# Patient Record
Sex: Female | Born: 1950
Health system: Southern US, Community
[De-identification: ages and names within clinical notes are randomized; demographics above are authoritative.]

## PROBLEM LIST (undated history)

## (undated) DIAGNOSIS — H269 Unspecified cataract: Secondary | ICD-10-CM

## (undated) DIAGNOSIS — M1990 Unspecified osteoarthritis, unspecified site: Secondary | ICD-10-CM

## (undated) DIAGNOSIS — T7840XA Allergy, unspecified, initial encounter: Secondary | ICD-10-CM

## (undated) DIAGNOSIS — E785 Hyperlipidemia, unspecified: Secondary | ICD-10-CM

## (undated) DIAGNOSIS — C50919 Malignant neoplasm of unspecified site of unspecified female breast: Secondary | ICD-10-CM

## (undated) DIAGNOSIS — Z923 Personal history of irradiation: Secondary | ICD-10-CM

## (undated) HISTORY — PX: VITRECTOMY: SHX106

## (undated) HISTORY — DX: Malignant neoplasm of unspecified site of unspecified female breast: C50.919

## (undated) HISTORY — DX: Unspecified cataract: H26.9

## (undated) HISTORY — PX: EYE SURGERY: SHX253

## (undated) HISTORY — DX: Unspecified osteoarthritis, unspecified site: M19.90

## (undated) HISTORY — DX: Hyperlipidemia, unspecified: E78.5

## (undated) HISTORY — DX: Allergy, unspecified, initial encounter: T78.40XA

## (undated) HISTORY — PX: BREAST SURGERY: SHX581

## (undated) HISTORY — PX: LAPAROSCOPY: SHX197

---

## 1986-07-25 HISTORY — PX: TUBAL LIGATION: SHX77

## 1997-07-25 HISTORY — PX: BREAST EXCISIONAL BIOPSY: SUR124

## 1997-07-25 HISTORY — PX: BREAST LUMPECTOMY: SHX2

## 1999-05-26 ENCOUNTER — Ambulatory Visit (HOSPITAL_BASED_OUTPATIENT_CLINIC_OR_DEPARTMENT_OTHER): Admission: RE | Admit: 1999-05-26 | Discharge: 1999-05-26 | Payer: Self-pay | Admitting: Surgery

## 2001-05-04 ENCOUNTER — Other Ambulatory Visit: Admission: RE | Admit: 2001-05-04 | Discharge: 2001-05-04 | Payer: Self-pay | Admitting: Obstetrics and Gynecology

## 2006-07-25 HISTORY — PX: COLONOSCOPY: SHX174

## 2007-05-23 ENCOUNTER — Ambulatory Visit: Payer: Self-pay | Admitting: Gastroenterology

## 2007-06-04 ENCOUNTER — Ambulatory Visit: Payer: Self-pay | Admitting: Gastroenterology

## 2014-03-12 ENCOUNTER — Ambulatory Visit (INDEPENDENT_AMBULATORY_CARE_PROVIDER_SITE_OTHER): Payer: BC Managed Care – PPO | Admitting: Physician Assistant

## 2014-03-12 ENCOUNTER — Encounter: Payer: Self-pay | Admitting: Physician Assistant

## 2014-03-12 VITALS — BP 94/62 | HR 90 | Ht 62.0 in | Wt 173.0 lb

## 2014-03-12 DIAGNOSIS — Z131 Encounter for screening for diabetes mellitus: Secondary | ICD-10-CM | POA: Diagnosis not present

## 2014-03-12 DIAGNOSIS — T7840XA Allergy, unspecified, initial encounter: Secondary | ICD-10-CM | POA: Diagnosis not present

## 2014-03-12 DIAGNOSIS — E785 Hyperlipidemia, unspecified: Secondary | ICD-10-CM | POA: Diagnosis not present

## 2014-03-12 MED ORDER — METHYLPREDNISOLONE SODIUM SUCC 125 MG IJ SOLR
125.0000 mg | Freq: Once | INTRAMUSCULAR | Status: DC
Start: 2014-03-12 — End: 2014-03-17

## 2014-03-12 NOTE — Patient Instructions (Signed)
Shingles vaccine at next visit.  Solumedrol given today.  Benadryl for itching.

## 2014-03-16 NOTE — Progress Notes (Signed)
   Subjective:    Patient ID: Janice Dudley, female    DOB: December 30, 1950, 62 y.o.   MRN: [id number]  HPI Pt is a 63 yo female who presents to the clinic to establish care.   .. Active Ambulatory Problems    Diagnosis Date Noted  . Hyperlipidemia    Resolved Ambulatory Problems    Diagnosis Date Noted  . No Resolved Ambulatory Problems   No Additional Past Medical History   . Family History  Problem Relation Age of Onset  . Stroke Mother 23  . Heart attack Mother 40  . Cancer Mother 37    Uterine  . Heart attack Father 52  . Cancer Father 66    Lung  . Stroke Father 73  . Cancer Sister 68    Breast and Uterine   . Hyperlipidemia Sister   . Thyroid nodules Brother   . Hyperlipidemia Brother   . Hyperlipidemia Sister   . Hyperlipidemia Brother   . Hyperlipidemia Brother    .Marland Kitchen History   Social History  . Marital Status: Married    Spouse Name: N/A    Number of Children: N/A  . Years of Education: N/A   Occupational History  . Not on file.   Social History Main Topics  . Smoking status: Former Smoker -- 0.25 packs/day for 20 years    Types: Cigarettes  . Smokeless tobacco: Never Used  . Alcohol Use: Yes  . Drug Use: No  . Sexual Activity: Yes   Other Topics Concern  . Not on file   Social History Narrative  . No narrative on file   Pt presents to the clinic with 3 days of itching and hives. She is allergic to peaches and must have eaten too many. She has not been able to stop itching. She denies any SOB or wheezing. No problems swallowing. Tried benadryl does not seem to be helping. She is aware of allergy but does not want to stop eating peaches.    Review of Systems  All other systems reviewed and are negative.      Objective:   Physical Exam  Constitutional: She is oriented to person, place, and time. She appears well-developed and well-nourished.  HENT:  Head: Normocephalic and atraumatic.  Cardiovascular: Normal rate, regular rhythm and  normal heart sounds.   Pulmonary/Chest: Effort normal and breath sounds normal.  Neurological: She is alert and oriented to person, place, and time.  Skin:  Small hives and maculopapular rash with pruritis.   Psychiatric: She has a normal mood and affect. Her behavior is normal.          Assessment & Plan:  Allergic reaction- solumedrol 125mg  given IM today. Discussed zyrtec daily if going to eat peaches and to help with itching. Follow up as needed. Discussed what to do if severe reaction occurred.   Needs CPE.  Cannot get shingles and solumedrol. Come back in 4 weeks for shingles vaccine.  Fasting labs were given for pt to have drawn.

## 2014-03-20 ENCOUNTER — Encounter: Payer: Self-pay | Admitting: Emergency Medicine

## 2014-03-20 ENCOUNTER — Emergency Department
Admission: EM | Admit: 2014-03-20 | Discharge: 2014-03-20 | Disposition: A | Discharge status: Home / Self Care | Payer: BC Managed Care – PPO | Source: Home / Self Care | Attending: Emergency Medicine | Admitting: Emergency Medicine

## 2014-03-20 DIAGNOSIS — N3000 Acute cystitis without hematuria: Secondary | ICD-10-CM

## 2014-03-20 DIAGNOSIS — R300 Dysuria: Secondary | ICD-10-CM

## 2014-03-20 DIAGNOSIS — N3001 Acute cystitis with hematuria: Secondary | ICD-10-CM

## 2014-03-20 LAB — POCT URINALYSIS DIP (MANUAL ENTRY)
Bilirubin, UA: NEGATIVE
Glucose, UA: NEGATIVE
Ketones, POC UA: NEGATIVE
Nitrite, UA: NEGATIVE
Protein Ur, POC: NEGATIVE
Spec Grav, UA: 1.015 (ref 1.005–1.03)
Urobilinogen, UA: 0.2 (ref 0–1)
pH, UA: 7 (ref 5–8)

## 2014-03-20 MED ORDER — CIPROFLOXACIN HCL 500 MG PO TABS: 500.0000 mg | ORAL_TABLET | Freq: Two times a day (BID) | ORAL | Status: DC

## 2014-03-20 NOTE — ED Provider Notes (Signed)
CSN: [id number]     Arrival date & time 03/20/14  1417 History   First MD Initiated Contact with Patient 03/20/14 1430     Chief Complaint  Patient presents with  . Dysuria   (Consider location/radiation/quality/duration/timing/severity/associated sxs/prior Treatment) HPI This is a 63 y.o. female who presents today with UTI symptoms for 5 days.  + dysuria + frequency + urgency No hematuria No vaginal discharge No fever/chills No lower abdominal pain No nausea No vomiting No back pain No fatigue She denies chance of pregnancy. Has tried Azo OTC without significant improvement.  Denies chance of pregnancy. Last UTI was about 3 years ago.    Past Medical History  Diagnosis Date  . Hyperlipidemia    Past Surgical History  Procedure Laterality Date  . Laparoscopy    . Breast lumpectomy    . Cesarean section     Family History  Problem Relation Age of Onset  . Stroke Mother 42  . Heart attack Mother 64  . Cancer Mother 67    Uterine  . Heart attack Father 35  . Cancer Father 34    Lung  . Stroke Father 56  . Cancer Sister 91    Breast and Uterine   . Hyperlipidemia Sister   . Thyroid nodules Brother   . Hyperlipidemia Brother   . Hyperlipidemia Sister   . Hyperlipidemia Brother   . Hyperlipidemia Brother    History  Substance Use Topics  . Smoking status: Former Smoker -- 0.25 packs/day for 20 years    Types: Cigarettes  . Smokeless tobacco: Never Used  . Alcohol Use: Yes   OB History   Grav Para Term Preterm Abortions TAB SAB Ect Mult Living                 Review of Systems  All other systems reviewed and are negative.   Allergies  Peach  Home Medications   Prior to Admission medications   Medication Sig Start Date End Date Taking? Authorizing Provider  phenazopyridine (PYRIDIUM) 95 MG tablet Take 95 mg by mouth 3 (three) times daily as needed for pain.   Yes Historical Provider, MD  aspirin 81 MG tablet Take 81 mg by mouth daily.     Historical Provider, MD  calcium carbonate (TUMS SMOOTHIES) 750 MG chewable tablet Chew 1 tablet by mouth daily.    Historical Provider, MD  cholecalciferol (VITAMIN D) 1000 UNITS tablet Take 1,000 Units by mouth daily.    Historical Provider, MD  ciprofloxacin (CIPRO) 500 MG tablet Take 1 tablet (500 mg total) by mouth 2 (two) times daily. For 7 days 03/20/14   Jacqulyn Cane, MD  diphenhydrAMINE (SOMINEX) 25 MG tablet Take 25 mg by mouth at bedtime as needed for sleep.    Historical Provider, MD  Magnesium 250 MG TABS Take by mouth.    Historical Provider, MD   BP 106/73  Pulse 75  Temp(Src) 97.5 F (36.4 C) (Oral)  Ht 5\' 2"  (1.575 m)  Wt 172 lb (78.019 kg)  BMI 31.45 kg/m2  SpO2 97% Physical Exam  Nursing note and vitals reviewed. Constitutional: She is oriented to person, place, and time. She appears well-developed and well-nourished. No distress.  HENT:  Mouth/Throat: Oropharynx is clear and moist.  Eyes: No scleral icterus.  Neck: Neck supple.  Cardiovascular: Normal rate, regular rhythm and normal heart sounds.   Pulmonary/Chest: Breath sounds normal.  Abdominal: Soft. She exhibits no mass. There is no hepatosplenomegaly. There is tenderness (Very minimal) in  the suprapubic area. There is no rebound, no guarding and no CVA tenderness.  Lymphadenopathy:    She has no cervical adenopathy.  Neurological: She is alert and oriented to person, place, and time.  Skin: Skin is warm and dry.    ED Course  Procedures (including critical care time) Labs Review Labs Reviewed  URINE CULTURE  POCT URINALYSIS DIP (MANUAL ENTRY)   Results for orders placed during the hospital encounter of 03/20/14  POCT URINALYSIS DIP (MANUAL ENTRY)      Result Value Ref Range   Color, UA light yellow     Clarity, UA cloudy     Glucose, UA neg     Bilirubin, UA negative     Bilirubin, UA negative     Spec Grav, UA 1.015  1.005 - 1.03   Blood, UA moderate     pH, UA 7.0  5 - 8   Protein Ur, POC  negative     Urobilinogen, UA 0.2  0 - 1   Nitrite, UA Negative     Leukocytes, UA large (3+)       Imaging Review No results found.   MDM   1. Dysuria   2. Acute cystitis with hematuria    Urinalysis is cloudy, and positive for moderate blood and large leukocytes.  Treatment options discussed, as well as risks, benefits, alternatives. Patient voiced understanding and agreement with the following plans: Send off urine culture Cipro 500 twice a day x7 days Other symptomatic care discussed. Follow-up with your primary care doctor in 5-7 days if not improving, or sooner if symptoms become worse. Precautions discussed. Red flags discussed. Questions invited and answered. Patient voiced understanding and agreement.     Jacqulyn Cane, MD 03/20/14 1500

## 2014-03-20 NOTE — ED Notes (Signed)
Dysuria, polyuria x 6 days, has been taking AZO every other day

## 2014-03-23 LAB — URINE CULTURE: Colony Count: 10000

## 2014-03-24 ENCOUNTER — Telehealth: Payer: Self-pay | Admitting: Emergency Medicine

## 2014-03-25 ENCOUNTER — Encounter: Payer: Self-pay | Admitting: Physician Assistant

## 2014-03-25 DIAGNOSIS — N6019 Diffuse cystic mastopathy of unspecified breast: Secondary | ICD-10-CM | POA: Insufficient documentation

## 2014-04-08 ENCOUNTER — Encounter: Payer: Self-pay | Admitting: Physician Assistant

## 2014-04-08 LAB — COMPLETE METABOLIC PANEL WITH GFR
ALK PHOS: 71 U/L (ref 39–117)
ALT: 20 U/L (ref 0–35)
AST: 22 U/L (ref 0–37)
Albumin: 4.2 g/dL (ref 3.5–5.2)
BILIRUBIN TOTAL: 0.5 mg/dL (ref 0.2–1.2)
BUN: 15 mg/dL (ref 6–23)
CO2: 24 mEq/L (ref 19–32)
Calcium: 9.4 mg/dL (ref 8.4–10.5)
Chloride: 104 mEq/L (ref 96–112)
Creat: 0.85 mg/dL (ref 0.50–1.10)
GFR, EST NON AFRICAN AMERICAN: 74 mL/min
GFR, Est African American: 86 mL/min
GLUCOSE: 89 mg/dL (ref 70–99)
Potassium: 4.3 mEq/L (ref 3.5–5.3)
Sodium: 137 mEq/L (ref 135–145)
Total Protein: 6.5 g/dL (ref 6.0–8.3)

## 2014-04-08 LAB — LIPID PANEL
CHOL/HDL RATIO: 4.8 ratio
CHOLESTEROL: 250 mg/dL — AB (ref 0–200)
HDL: 52 mg/dL (ref >39)
LDL Cholesterol: 164 mg/dL — ABNORMAL HIGH (ref 0–99)
Triglycerides: 172 mg/dL — ABNORMAL HIGH (ref <150)
VLDL: 34 mg/dL (ref 0–40)

## 2014-04-09 ENCOUNTER — Encounter: Payer: BC Managed Care – PPO | Admitting: Physician Assistant

## 2014-04-11 ENCOUNTER — Ambulatory Visit (INDEPENDENT_AMBULATORY_CARE_PROVIDER_SITE_OTHER): Payer: BC Managed Care – PPO | Admitting: Physician Assistant

## 2014-04-11 ENCOUNTER — Encounter: Payer: Self-pay | Admitting: Physician Assistant

## 2014-04-11 VITALS — BP 115/65 | HR 92 | Ht 62.0 in | Wt 175.0 lb

## 2014-04-11 DIAGNOSIS — Z23 Encounter for immunization: Secondary | ICD-10-CM | POA: Diagnosis not present

## 2014-04-11 DIAGNOSIS — E669 Obesity, unspecified: Secondary | ICD-10-CM

## 2014-04-11 DIAGNOSIS — Z0000 Encounter for general adult medical examination without abnormal findings: Secondary | ICD-10-CM

## 2014-04-11 DIAGNOSIS — E785 Hyperlipidemia, unspecified: Secondary | ICD-10-CM

## 2014-04-11 DIAGNOSIS — E781 Pure hyperglyceridemia: Secondary | ICD-10-CM

## 2014-04-11 MED ORDER — PHENTERMINE HCL 37.5 MG PO TABS: 37.5000 mg | ORAL_TABLET | Freq: Every day | ORAL | Status: DC

## 2014-04-11 NOTE — Progress Notes (Signed)
   Subjective:    Patient ID: Flint Melter, female    DOB: 02-24-1951, 63 y.o.   MRN: [id number]  HPI    Review of Systems     Objective:   Physical Exam        Assessment & Plan:   Subjective:     PETE MERTEN is a 63 y.o. female and is here for a comprehensive physical exam. The patient reports no problems.  History   Social History  . Marital Status: Married    Spouse Name: N/A    Number of Children: N/A  . Years of Education: N/A   Occupational History  . Not on file.   Social History Main Topics  . Smoking status: Former Smoker -- 0.25 packs/day for 20 years    Types: Cigarettes  . Smokeless tobacco: Never Used  . Alcohol Use: Yes  . Drug Use: No  . Sexual Activity: Yes   Other Topics Concern  . Not on file   Social History Narrative  . No narrative on file   Health Maintenance  Topic Date Due  . Zostavax  04/16/2012  . Influenza Vaccine  02/23/2015  . Mammogram  07/26/2015  . Pap Smear  07/25/2016  . Colonoscopy  06/03/2017  . Tetanus/tdap  08/26/2023    The following portions of the patient's history were reviewed and updated as appropriate: allergies, current medications, past family history, past medical history, past social history, past surgical history and problem list.  Review of Systems A comprehensive review of systems was negative.   Objective:    BP 115/65  Pulse 92  Ht 5\' 2"  (1.575 m)  Wt 175 lb (79.379 kg)  BMI 32.00 kg/m2 General appearance: alert, cooperative, appears stated age and mildly obese Head: Normocephalic, without obvious abnormality, atraumatic Eyes: conjunctivae/corneas clear. PERRL, EOM's intact. Fundi benign. Ears: normal TM's and external ear canals both ears Nose: Nares normal. Septum midline. Mucosa normal. No drainage or sinus tenderness. Throat: lips, mucosa, and tongue normal; teeth and gums normal Neck: no adenopathy, no carotid bruit, no JVD, supple, symmetrical, trachea midline and thyroid  not enlarged, symmetric, no tenderness/mass/nodules Back: symmetric, no curvature. ROM normal. No CVA tenderness. Lungs: clear to auscultation bilaterally Heart: regular rate and rhythm, S1, S2 normal, no murmur, click, rub or gallop Abdomen: soft, non-tender; bowel sounds normal; no masses,  no organomegaly Extremities: extremities normal, atraumatic, no cyanosis or edema Pulses: 2+ and symmetric Skin: Skin color, texture, turgor normal. No rashes or lesions Lymph nodes: Cervical, supraclavicular, and axillary nodes normal. Neurologic: Grossly normal    Assessment:    Healthy female exam.      Plan:    CPE- Flu shot given today. Tdap up to date. Mammogram done. Pap done. Colonoscopy done. Pt follow up in 4 weeks for shingles vaccines. Gave HO. Discussed vitamin D and calcium recommendations of 800 units and 1200mg . Discussed weight loss, see obesity.   Obesity- given phentermine to start. Discussed SE's. Follow up in 4 weeks. Discussed only as good and diet and exercise with it.   Hyperlipidemia/hypertriglyeremia- LDL elevated. No DM but she does have age working against her. I would like for her to start statin. She would like to try 6 months of diet and exercise. Will recheck in 6 months and make a decision. In meantime can try red yeast rice and/or fish oil to help lower without prescriptions.  See After Visit Summary for Counseling Recommendations

## 2014-04-11 NOTE — Patient Instructions (Signed)
Shingles Shingles (herpes zoster) is an infection that is caused by the same virus that causes chickenpox (varicella). The infection causes a painful skin rash and fluid-filled blisters, which eventually break open, crust over, and heal. It may occur in any area of the body, but it usually affects only one side of the body or face. The pain of shingles usually lasts about 1 month. However, some people with shingles may develop long-term (chronic) pain in the affected area of the body. Shingles often occurs many years after the person had chickenpox. It is more common:  In people older than 50 years.  In people with weakened immune systems, such as those with HIV, AIDS, or cancer.  In people taking medicines that weaken the immune system, such as transplant medicines.  In people under great stress. CAUSES  Shingles is caused by the varicella zoster virus (VZV), which also causes chickenpox. After a person is infected with the virus, it can remain in the person's body for years in an inactive state (dormant). To cause shingles, the virus reactivates and breaks out as an infection in a nerve root. The virus can be spread from person to person (contagious) through contact with open blisters of the shingles rash. It will only spread to people who have not had chickenpox. When these people are exposed to the virus, they may develop chickenpox. They will not develop shingles. Once the blisters scab over, the person is no longer contagious and cannot spread the virus to others. SIGNS AND SYMPTOMS  Shingles shows up in stages. The initial symptoms may be pain, itching, and tingling in an area of the skin. This pain is usually described as burning, stabbing, or throbbing.In a few days or weeks, a painful red rash will appear in the area where the pain, itching, and tingling were felt. The rash is usually on one side of the body in a band or belt-like pattern. Then, the rash usually turns into fluid-filled  blisters. They will scab over and dry up in approximately 2-3 weeks. Flu-like symptoms may also occur with the initial symptoms, the rash, or the blisters. These may include:  Fever.  Chills.  Headache.  Upset stomach. DIAGNOSIS  Your health care provider will perform a skin exam to diagnose shingles. Skin scrapings or fluid samples may also be taken from the blisters. This sample will be examined under a microscope or sent to a lab for further testing. TREATMENT  There is no specific cure for shingles. Your health care provider will likely prescribe medicines to help you manage the pain, recover faster, and avoid long-term problems. This may include antiviral drugs, anti-inflammatory drugs, and pain medicines. HOME CARE INSTRUCTIONS   Take a cool bath or apply cool compresses to the area of the rash or blisters as directed. This may help with the pain and itching.   Take medicines only as directed by your health care provider.   Rest as directed by your health care provider.  Keep your rash and blisters clean with mild soap and cool water or as directed by your health care provider.  Do not pick your blisters or scratch your rash. Apply an anti-itch cream or numbing creams to the affected area as directed by your health care provider.  Keep your shingles rash covered with a loose bandage (dressing).  Avoid skin contact with:  Babies.   Pregnant women.   Children with eczema.   Elderly people with transplants.   People with chronic illnesses, such as leukemia   or AIDS.   Wear loose-fitting clothing to help ease the pain of material rubbing against the rash.  Keep all follow-up visits as directed by your health care provider.If the area involved is on your face, you may receive a referral for a specialist, such as an eye doctor (ophthalmologist) or an ear, nose, and throat (ENT) doctor. Keeping all follow-up visits will help you avoid eye problems, chronic pain, or  disability.  SEEK IMMEDIATE MEDICAL CARE IF:   You have facial pain, pain around the eye area, or loss of feeling on one side of your face.  You have ear pain or ringing in your ear.  You have loss of taste.  Your pain is not relieved with prescribed medicines.   Your redness or swelling spreads.   You have more pain and swelling.  Your condition is worsening or has changed.   You have a fever. MAKE SURE YOU:  Understand these instructions.  Will watch your condition.  Will get help right away if you are not doing well or get worse. Document Released: 07/11/2005 Document Revised: 11/25/2013 Document Reviewed: 02/23/2012 [facility] Patient Information 2015 [place], [place]. This information is not intended to replace advice given to you by your health care provider. Make sure you discuss any questions you have with your health care provider.   Keeping You Healthy  Get These Tests  Blood Pressure- Have your blood pressure checked by your healthcare provider at least once a year.  Normal blood pressure is 120/80.  Weight- Have your body mass index (BMI) calculated to screen for obesity.  BMI is a measure of body fat based on height and weight.  You can calculate your own BMI at [employer].it  Cholesterol- Have your cholesterol checked every year.  Diabetes- Have your blood sugar checked every year if you have high blood pressure, high cholesterol, a family history of diabetes or if you are overweight.  Pap Smear- Have a pap smear every 1 to 3 years if you have been sexually active.  If you are older than 65 and recent pap smears have been normal you may not need additional pap smears.  In addition, if you have had a hysterectomy  For benign disease additional pap smears are not necessary.  Mammogram-Yearly mammograms are essential for early detection of breast cancer  Screening for Colon Cancer- Colonoscopy starting at age 30. Screening may begin sooner  depending on your family history and other health conditions.  Follow up colonoscopy as directed by your Gastroenterologist.  Screening for Osteoporosis- Screening begins at age 30 with bone density scanning, sooner if you are at higher risk for developing Osteoporosis.  Get these medicines  Calcium with Vitamin D- Your body requires 1200-1500 mg of Calcium a day and [phone] IU of Vitamin D a day.  You can only absorb 500 mg of Calcium at a time therefore Calcium must be taken in 2 or 3 separate doses throughout the day.  Hormones- Hormone therapy has been associated with increased risk for certain cancers and heart disease.  Talk to your healthcare provider about if you need relief from menopausal symptoms.  Aspirin- Ask your healthcare provider about taking Aspirin to prevent Heart Disease and Stroke.  Get these Immuniztions  Flu shot- Every fall  Pneumonia shot- Once after the age of 8; if you are younger ask your healthcare provider if you need a pneumonia shot.  Tetanus- Every ten years.  Zostavax- Once after the age of 20 to prevent shingles.  Take  these steps  Don't smoke- Your healthcare provider can help you quit. For tips on how to quit, ask your healthcare provider or go to www.smokefree.gov or call 1-800 QUIT-NOW.  Be physically active- Exercise 5 days a week for a minimum of 30 minutes.  If you are not already physically active, start slow and gradually work up to 30 minutes of moderate physical activity.  Try walking, dancing, bike riding, swimming, etc.  Eat a healthy diet- Eat a variety of healthy foods such as fruits, vegetables, whole grains, low fat milk, low fat cheeses, yogurt, lean meats, chicken, fish, eggs, dried beans, tofu, etc.  For more information go to www.thenutritionsource.org  Dental visit- Brush and floss teeth twice daily; visit your dentist twice a year.  Eye exam- Visit your Optometrist or Ophthalmologist yearly.  Drink alcohol in moderation-  Limit alcohol intake to one drink or less a day.  Never drink and drive.  Depression- Your emotional health is as important as your physical health.  If you're feeling down or losing interest in things you normally enjoy, please talk to your healthcare provider.  Seat Belts- can save your life; always wear one  Smoke/Carbon Monoxide detectors- These detectors need to be installed on the appropriate level of your home.  Replace batteries at least once a year.  Violence- If anyone is threatening or hurting you, please tell your healthcare provider.  Living Will/ Health care power of attorney- Discuss with your healthcare provider and family.

## 2014-04-15 DIAGNOSIS — E781 Pure hyperglyceridemia: Secondary | ICD-10-CM | POA: Insufficient documentation

## 2014-04-15 DIAGNOSIS — E669 Obesity, unspecified: Secondary | ICD-10-CM | POA: Insufficient documentation

## 2014-05-16 ENCOUNTER — Encounter: Payer: Self-pay | Admitting: Physician Assistant

## 2014-05-16 ENCOUNTER — Ambulatory Visit (INDEPENDENT_AMBULATORY_CARE_PROVIDER_SITE_OTHER): Payer: BC Managed Care – PPO | Admitting: Physician Assistant

## 2014-05-16 VITALS — BP 102/69 | HR 86 | Ht 62.0 in | Wt 171.0 lb

## 2014-05-16 DIAGNOSIS — E669 Obesity, unspecified: Secondary | ICD-10-CM

## 2014-05-16 DIAGNOSIS — S93402D Sprain of unspecified ligament of left ankle, subsequent encounter: Secondary | ICD-10-CM

## 2014-05-16 DIAGNOSIS — Z23 Encounter for immunization: Secondary | ICD-10-CM

## 2014-05-16 DIAGNOSIS — S93409A Sprain of unspecified ligament of unspecified ankle, initial encounter: Secondary | ICD-10-CM | POA: Insufficient documentation

## 2014-05-16 DIAGNOSIS — R635 Abnormal weight gain: Secondary | ICD-10-CM

## 2014-05-16 MED ORDER — PHENTERMINE HCL 37.5 MG PO TABS: 37.5000 mg | ORAL_TABLET | Freq: Every day | ORAL | Status: DC

## 2014-05-16 NOTE — Progress Notes (Signed)
   Subjective:    Patient ID: Janice Dudley, female    DOB: 1950-12-16, 63 y.o.   MRN: [id number]  HPI Patient is a 63 year old female who presents to the clinic to followup on phentermine. She is doing well with weight loss. She has lost 4 pounds this month. She likes the way she feels with the phentermine. She denies any side effects of insomnia, anorexia, palpitations. She did reach a roadblock when she sprained her left ankle 4 weeks ago. That has done and her exercise. She continues to try to make better choices for.  She would like me to recheck her left ankle. She has been in a ankle brace for the last 4 weeks. The swelling does seem to decrease. She is no longer taking ibuprofen for pain. Sometimes when she touches out her left foot she does still feel some strain and pain on the lateral side of the ankle. [place] was at the beach 4 [place] ago when she missed the last step of the house she was staying at. She did go to urgent care and had x-rays that were negative for any fracture. She has always been able to bear weight.     Review of Systems  All other systems reviewed and are negative.      Objective:   Physical Exam  Constitutional: She is oriented to person, place, and time. She appears well-developed and well-nourished.  HENT:  Head: Normocephalic and atraumatic.  Cardiovascular: Normal rate, regular rhythm and normal heart sounds.   Pulmonary/Chest: Effort normal and breath sounds normal.  Musculoskeletal:  Very good range of motion of left ankle. There still some residual swelling around the lateral malleolus posteriorly. No bruising or erythema noted. No tenderness over anterior foot. Strength 5 out of 5. Some discomfort with strength test.  Neurological: She is alert and oriented to person, place, and time.  Skin: Skin is dry.  Psychiatric: She has a normal mood and affect. Her behavior is normal.          Assessment & Plan:  Obesity/abnormal weight gain-  phentermine refilled. Discussed with pt more ways to lose weight. Encouraged elipical. Discussed counting calories 1500 calories a weekend. Follow up nurse visit is one month.   Left ankle sprain- reassured patient that her left ankle sprain seems to be healing nicely today. I will continue to wear ankle brace for support for the next 4 weeks. Discussed range of motion exercises of the left ankle. Gave her a copy of some sports medicine exercises to help strengthen. Call with any questions or concerns. Slowly increase walking for weight loss as tolerated do to pain or swelling. Use ibuprofen as needed. Encouraged icing after long periods of standing or exercise.   zostavax given without complication today.  After injection was given he came to my attention that she was given the vaccine that had been in the freezer when the power went out and potentially deactivated live virus due to temperature decrease. We have contacted manager on how to deal with this situation.

## 2014-05-16 NOTE — Patient Instructions (Signed)
Acute Ankle Sprain with Phase II Rehab An acute ankle sprain is a partial or complete tear in one or more of the ligaments of the ankle due to traumatic injury. The severity of the injury depends on both the number of ligaments sprained and the grade of sprain. There are 3 grades of sprains.  A grade 1 sprain is a mild sprain. There is a slight pull without obvious tearing. There is no loss of strength, and the muscle and ligament are the correct length.  A grade 2 sprain is a moderate sprain. There is tearing of fibers within the substance of the ligament where it connects two bones or two cartilages. The length of the ligament is increased, and there is usually decreased strength.  A grade 3 sprain is a complete rupture of the ligament and is uncommon. In addition to the grade of sprain, there are 3 types of ankle sprains.  Lateral ankle sprains. This is a sprain of one or more of the 3 ligaments on the outer side (lateral) of the ankle. These are the most common sprains. Medial ankle sprains. There is one large triangular ligament on the inner side (medial) of the ankle that is susceptible to injury. Medial ankle sprains are less common. Syndesmosis, "high ankle," sprains. The syndesmosis is the ligament that connects the two bones of the lower leg. Syndesmosis sprains usually only occur with very severe ankle sprains. SYMPTOMS  Pain, tenderness, and swelling in the ankle, starting at the side of injury that may progress to the whole ankle and foot with time.  "Pop" or tearing sensation at the time of injury.  Bruising that may spread to the heel.  Impaired ability to walk soon after injury. CAUSES   Acute ankle sprains are caused by trauma placed on the ankle that temporarily forces or pries the anklebone (talus) out of its normal socket.  Stretching or tearing of the ligaments that normally hold the joint in place (usually due to a twisting injury). RISK INCREASES WITH:  Previous  ankle sprain.  Sports in which the foot may land awkwardly (basketball, volleyball, soccer) or walking or running on uneven or rough surfaces.  Shoes with inadequate support to prevent sideways motion when stress occurs.  Poor strength and flexibility.  Poor balance skills.  Contact sports. PREVENTION  Warm up and stretch properly before activity.  Maintain physical fitness:  Ankle and leg flexibility, muscle strength, and endurance.  Cardiovascular fitness.  Balance training activities.  Use proper technique and have a coach correct improper technique.  Taping, protective strapping, bracing, or high-top tennis shoes may help prevent injury. Initially, tape is best. However, it loses most of its support function within 10 to 15 minutes.  Wear proper fitted protective shoes. Combining high-top shoes with taping or bracing is more effective than using either alone.  Provide the ankle with support during sports and practice activities for 12 months following injury. PROGNOSIS   If treated properly, ankle sprains can be expected to recover completely. However, the length of recovery depends on the degree of injury.  A grade 1 sprain usually heals enough in 5 to 7 days to allow modified activity and requires an average of 6 weeks to heal completely.  A grade 2 sprain requires 6 to 10 weeks to heal completely.  A grade 3 sprain requires 12 to 16 weeks to heal.  A syndesmosis sprain often takes more than 3 months to heal. RELATED COMPLICATIONS   Frequent recurrence of symptoms may result   in a chronic problem. Appropriately addressing the problem the first time decreases the frequency of recurrence and optimizes healing time. Severity of initial sprain does not predict the likelihood of later instability.  Injury to other structures (bone, cartilage, or tendon).  Chronically unstable or arthritic ankle joint are possible with repeated sprains. TREATMENT Treatment initially  involves the use of ice, medicine, and compression bandages to help reduce pain and inflammation. Ankle sprains are usually immobilized in a walking cast or boot to allow for healing. Crutches may be recommended to reduce pressure on the injury. After immobilization, strengthening and stretching exercises may be necessary to regain strength and a full range of motion. Surgery is rarely needed to treat ankle sprains. MEDICATION   Nonsteroidal anti-inflammatory medicines, such as aspirin and ibuprofen (do not take for the first 3 days after injury or within 7 days before surgery), or other minor pain relievers, such as acetaminophen, are often recommended. Take these as directed by your caregiver. Contact your caregiver immediately if any bleeding, stomach upset, or signs of an allergic reaction occur from these medicines.  Ointments applied to the skin may be helpful.  Pain relievers may be prescribed as necessary by your caregiver. Do not take prescription pain medicine for longer than 4 to 7 days. Use only as directed and only as much as you need. HEAT AND COLD  Cold treatment (icing) is used to relieve pain and reduce inflammation for acute and chronic cases. Cold should be applied for 10 to 15 minutes every 2 to 3 hours for inflammation and pain and immediately after any activity that aggravates your symptoms. Use ice packs or an ice massage.  Heat treatment may be used before performing stretching and strengthening activities prescribed by your caregiver. Use a heat pack or a warm soak. SEEK IMMEDIATE MEDICAL CARE IF:   Pain, swelling, or bruising worsens despite treatment.  You experience pain, numbness, discoloration, or coldness in the foot or toes.  New, unexplained symptoms develop. (Drugs used in treatment may produce side effects.) EXERCISES  PHASE II EXERCISES RANGE OF MOTION (ROM) AND STRETCHING EXERCISES - Ankle Sprain, Acute-Phase II, Weeks 3 to 4 After your physician, physical  therapist, or athletic trainer feels your knee has made progress significant enough to begin more advanced exercises, he or she may recommend completing some of the following exercises. Although each person heals at different rates, most people will be ready for these exercises between 3 and 4 weeks after their injury. Do not begin these exercises until you have your caregiver's permission. He or she may also advise you to continue with the exercises which you completed in Phase I of your rehabilitation. While completing these exercises, remember:   Restoring tissue flexibility helps normal motion to return to the joints. This allows healthier, less painful movement and activity.  An effective stretch should be held for at least 30 seconds.  A stretch should never be painful. You should only feel a gentle lengthening or release in the stretched tissue. RANGE OF MOTION - Ankle Plantar Flexion   Sit with your right / left leg crossed over your opposite knee.  Use your opposite hand to pull the top of your foot and toes toward you.  You should feel a gentle stretch on the top of your foot/ankle. Hold this position for __________. Repeat __________ times. Complete __________ times per day.  RANGE OF MOTION - Ankle Eversion  Sit with your right / left ankle crossed over your opposite knee.    Grip your foot with your opposite hand, placing your thumb on the top of your foot and your fingers across the bottom of your foot.  Gently push your foot downward with a slight rotation so your littlest toes rise slightly  You should feel a gentle stretch on the inside of your ankle. Hold the stretch for __________ seconds. Repeat __________ times. Complete this exercise __________ times per day.  RANGE OF MOTION - Ankle Inversion  Sit with your right / left ankle crossed over your opposite knee.  Grip your foot with your opposite hand, placing your thumb on the bottom of your foot and your fingers across  the top of your foot.  Gently pull your foot so the smallest toe comes toward you and your thumb pushes the inside of the ball of your foot away from you.  You should feel a gentle stretch on the outside of your ankle. Hold the stretch for __________ seconds. Repeat __________ times. Complete this exercise __________ times per day.  STRETCH - Gastrocsoleus  Sit with your right / left leg extended. Holding onto both ends of a belt or towel, loop it around the ball of your foot.  Keeping your right / left ankle and foot relaxed and your knee straight, pull your foot and ankle toward you using the belt/towel.  You should feel a gentle stretch behind your calf or knee. Hold this position for __________ seconds. Repeat __________ times. Complete this stretch __________ times per day.  RANGE OF MOTION - Ankle Dorsiflexion, Active Assisted  Remove shoes and sit on a chair that is preferably not on a carpeted surface.  Place right / left foot under knee. Extend your opposite leg for support.  Keeping your heel down, slide your right / left foot back toward the chair until you feel a stretch at your ankle or calf. If you do not feel a stretch, slide your bottom forward to the edge of the chair while still keeping your heel down.  Hold this stretch for __________ seconds. Repeat __________ times. Complete this stretch __________ times per day.  STRETCH - Gastroc, Standing   Place hands on wall.  Extend right / left leg and place a folded washcloth under the arch of your foot for support. Keep the front knee somewhat bent.  Slightly point your toes inward on your back foot.  Keeping your right / left heel on the floor and your knee straight, shift your weight toward the wall, not allowing your back to arch.  You should feel a gentle stretch in the calf. Hold this position for __________ seconds. Repeat __________ times. Complete this stretch __________ times per day. STRETCH - Soleus,  Standing  Place hands on wall.  Extend right / left leg and place a folded washcloth under the arch of your foot for support. Keep the front knee somewhat bent.  Slightly point your toes inward on your back foot.  Keep your right / left heel on the floor, bend your back knee, and slightly shift your weight over the back leg so that you feel a gentle stretch deep in your back calf.  Hold this position for __________ seconds. Repeat __________ times. Complete this stretch __________ times per day. STRETCH - Gastrocsoleus, Standing Note: This exercise can place a lot of stress on your foot and ankle. Please complete this exercise only if specifically instructed by your caregiver.   Place the ball of your right / left foot on a step, keeping your other   foot firmly on the same step.  Hold on to the wall or a rail for balance.  Slowly lift your other foot, allowing your body weight to press your heel down over the edge of the step.  You should feel a stretch in your right / left calf.  Hold this position for __________ seconds.  Repeat this exercise with a slight bend in your knee. Repeat __________ times. Complete this stretch __________ times per day.  STRENGTHENING EXERCISES - Ankle Sprain, Acute-Phase II Around 3 to 4 weeks after your injury, you may progress to some of these exercises in your rehabilitation program. Do not begin these until you have your caregiver's permission. Although your condition has improved, the Phase I exercises will continue to be helpful and you may continue to complete them. As you complete strengthening exercises, remember:   Strong muscles with good endurance tolerate stress better.  Do the exercises as initially prescribed by your caregiver. Progress slowly with each exercise, gradually increasing the number of repetitions and weight used under his or her guidance.  You may experience muscle soreness or fatigue, but the pain or discomfort you are trying  to eliminate should never worsen during these exercises. If this pain does worsen, stop and make certain you are following the directions exactly. If the pain is still present after adjustments, discontinue the exercise until you can discuss the trouble with your caregiver. STRENGTH - Plantar-flexors, Standing  Stand with your feet shoulder width apart. Steady yourself with a wall or table using as little support as needed.  Keeping your weight evenly spread over the width of your feet, rise up on your toes.*  Hold this position for __________ seconds. Repeat __________ times. Complete this exercise __________ times per day.  *If this is too easy, shift your weight toward your right / left leg until you feel challenged. Ultimately, you may be asked to do this exercise with your right / left foot only. STRENGTH - Dorsiflexors and Plantar-flexors, Heel/toe Walking  Dorsiflexion: Walk on your heels only. Keep your toes as high as possible.  Walk for ____________________ seconds/feet.  Repeat __________ times. Complete __________ times per day.  Plantar flexion: Walk on your toes only. Keep your heels as high as possible.  Walk for ____________________ seconds/feet. Repeat __________ times. Complete __________ times per day.  BALANCE - Tandem Walking  Place your uninjured foot on a line 2 to 4 inches wide and at least 10 feet long.  Keeping your balance without using anything for extra support, place your right / left heel directly in front of your other foot.  Slowly raise your back foot up, lifting from the heel to the toes, and place it directly in front of the right / left foot.  Continue to walk along the line slowly. Walk for ____________________ feet. Repeat ____________________ times. Complete ____________________ times per day. BALANCE - Inversion/Eversion Use caution, these are advanced level exercises. Do not begin them until you are advised to do so.   Create a balance  board using a sturdy board about 1  feet long and at 1 to 1  feet wide and a 1  inch diameter rod or pipe that is as long as the board's width. A copper pipe or a solid broomstick work well.  Stand on a non-carpeted surface near a countertop or wall. Step onto the board so that your feet are hip-width apart and equally straddle the rod/pipe.  Keeping your feet in place, complete these two exercises   without shifting your upper body or hips:  Tip the board from side-to-side. Control the movement so the board does not forcefully strike the ground. The board should silently tap the ground.  Tip the board side-to-side without striking the ground. Occasionally pause and maintain a steady position at various points.  Repeat the first two exercises, but use only your right / left foot. Place your right / left foot directly over the rod/pipe. Repeat __________ times. Complete this exercise __________ times a day. BALANCE - Plantar/Dorsi Flexion Use caution, these are advanced level exercises. Do not begin them until you are advised to do so.   Create a balance board using a sturdy board about 1  feet long and at 1 to 1  feet wide and a 1  inch diameter rod or pipe that is as long as the board's width. A copper pipe or a solid broomstick work well.  Stand on a non-carpeted surface near a countertop or wall. Stand on the board so that the rod/pipe runs under the arches in your feet.  Keeping your feet in place, complete these two exercises without shifting your upper body or hips:  Tip the board from side-to-side. Control the movement so the board does not forcefully strike the ground. The board should silently tap the ground.  Tip the board side-to-side without striking the ground. Occasionally pause and maintain a steady position at various points.  Repeat the first two exercises, but use only your right / left foot. Stand in the center of the board. Repeat __________ times. Complete this  exercise __________ times a day. STRENGTH - Plantar-flexors, Eccentric Note: This exercise can place a lot of stress on your foot and ankle. Please complete this exercise only if specifically instructed by your caregiver.   Place the balls of your feet on a step. With your hands, use only enough support from a wall or rail to keep your balance.  Keep your knees straight and rise up on your toes.  Slowly shift your weight entirely to your toes and pick up your opposite foot. Gently and with controlled movement, lower your weight through your right / left foot so that your heel drops below the level of the step. You will feel a slight stretch in the back of your calf at the ending position.  Use the healthy leg to help rise up onto the balls of both feet, then lower weight only on the right / left leg again. Build up to 15 repetitions. Then progress to 3 consecutive sets of 15 repetitions.*  After completing the above exercise, complete the same exercise with a slight knee bend (about 30 degrees). Again, build up to 15 repetitions. Then progress to 3 consecutive sets of 15 repetitions.* Perform this exercise __________ times per day.  *When you easily complete 3 sets of 15, your physician, physical therapist, or athletic trainer may advise you to add resistance by wearing a backpack filled with additional weight. Document Released: 10/31/2005 Document Revised: 10/03/2011 Document Reviewed: 10/23/2008 Exit[employer] Patient Information 2015 [employer]. This information is not intended to replace advice given to you by your health care provider. Make sure you discuss any questions you have with your health care provider.  

## 2014-06-16 ENCOUNTER — Other Ambulatory Visit: Payer: Self-pay | Admitting: Physician Assistant

## 2014-06-17 ENCOUNTER — Ambulatory Visit (INDEPENDENT_AMBULATORY_CARE_PROVIDER_SITE_OTHER): Payer: BC Managed Care – PPO | Admitting: Physician Assistant

## 2014-06-17 VITALS — BP 110/70 | HR 87 | Wt 168.0 lb

## 2014-06-17 DIAGNOSIS — R635 Abnormal weight gain: Secondary | ICD-10-CM

## 2014-06-17 DIAGNOSIS — E669 Obesity, unspecified: Secondary | ICD-10-CM | POA: Diagnosis not present

## 2014-06-17 MED ORDER — PHENTERMINE HCL 37.5 MG PO TABS: 37.5000 mg | ORAL_TABLET | Freq: Every day | ORAL | Status: DC

## 2014-06-17 NOTE — Progress Notes (Signed)
   Subjective:    Patient ID: Janice Dudley, female    DOB: Apr 08, 1951, 63 y.o.   MRN: [id number]  HPI  Janice Dudley is here for a blood pressure and weight check. Denies insomnia, increased heart rate, chest pain or shortness of breath.   Review of Systems     Objective:   Physical Exam        Assessment & Plan:  Janice Dudley has lost weight. A prescription of phentermine will be faxed to [facility]. Down 3lbs. Doing well. Follow up in 1 month. Janice Planas PA-C

## 2014-07-16 ENCOUNTER — Ambulatory Visit (INDEPENDENT_AMBULATORY_CARE_PROVIDER_SITE_OTHER): Payer: BC Managed Care – PPO | Admitting: Physician Assistant

## 2014-07-16 ENCOUNTER — Encounter: Payer: Self-pay | Admitting: Physician Assistant

## 2014-07-16 VITALS — BP 123/84 | HR 89 | Temp 98.0°F | Ht 62.0 in | Wt 164.0 lb

## 2014-07-16 DIAGNOSIS — R059 Cough, unspecified: Secondary | ICD-10-CM

## 2014-07-16 DIAGNOSIS — J069 Acute upper respiratory infection, unspecified: Secondary | ICD-10-CM | POA: Diagnosis not present

## 2014-07-16 DIAGNOSIS — R05 Cough: Secondary | ICD-10-CM | POA: Diagnosis not present

## 2014-07-16 DIAGNOSIS — E669 Obesity, unspecified: Secondary | ICD-10-CM | POA: Diagnosis not present

## 2014-07-16 DIAGNOSIS — R635 Abnormal weight gain: Secondary | ICD-10-CM

## 2014-07-16 MED ORDER — AZITHROMYCIN 250 MG PO TABS: ORAL_TABLET | ORAL | Status: DC

## 2014-07-16 MED ORDER — BENZONATATE 200 MG PO CAPS: 200.0000 mg | ORAL_CAPSULE | Freq: Two times a day (BID) | ORAL | Status: DC | PRN

## 2014-07-16 MED ORDER — HYDROCODONE-HOMATROPINE 5-1.5 MG/5ML PO SYRP: 5.0000 mL | ORAL_SOLUTION | Freq: Every evening | ORAL | Status: DC | PRN

## 2014-07-16 MED ORDER — PHENTERMINE HCL 37.5 MG PO TABS: 37.5000 mg | ORAL_TABLET | Freq: Every day | ORAL | Status: DC

## 2014-07-16 NOTE — Patient Instructions (Signed)
Upper Respiratory Infection, Adult An upper respiratory infection (URI) is also sometimes known as the common cold. The upper respiratory tract includes the nose, sinuses, throat, trachea, and bronchi. Bronchi are the airways leading to the lungs. Most people improve within 1 week, but symptoms can last up to 2 weeks. A residual cough may last even longer.  CAUSES Many different viruses can infect the tissues lining the upper respiratory tract. The tissues become irritated and inflamed and often become very moist. Mucus production is also common. A cold is contagious. You can easily spread the virus to others by oral contact. This includes kissing, sharing a glass, coughing, or sneezing. Touching your mouth or nose and then touching a surface, which is then touched by another person, can also spread the virus. SYMPTOMS  Symptoms typically develop 1 to 3 days after you come in contact with a cold virus. Symptoms vary from person to person. They may include:  Runny nose.  Sneezing.  Nasal congestion.  Sinus irritation.  Sore throat.  Loss of voice (laryngitis).  Cough.  Fatigue.  Muscle aches.  Loss of appetite.  Headache.  Low-grade fever. DIAGNOSIS  You might diagnose your own cold based on familiar symptoms, since most people get a cold 2 to 3 times a year. Your caregiver can confirm this based on your exam. Most importantly, your caregiver can check that your symptoms are not due to another disease such as strep throat, sinusitis, pneumonia, asthma, or epiglottitis. Blood tests, throat tests, and X-rays are not necessary to diagnose a common cold, but they may sometimes be helpful in excluding other more serious diseases. Your caregiver will decide if any further tests are required. RISKS AND COMPLICATIONS  You may be at risk for a more severe case of the common cold if you smoke cigarettes, have chronic heart disease (such as heart failure) or lung disease (such as asthma), or if  you have a weakened immune system. The very young and very old are also at risk for more serious infections. Bacterial sinusitis, middle ear infections, and bacterial pneumonia can complicate the common cold. The common cold can worsen asthma and chronic obstructive pulmonary disease (COPD). Sometimes, these complications can require emergency medical care and may be life-threatening. PREVENTION  The best way to protect against getting a cold is to practice good hygiene. Avoid oral or hand contact with people with cold symptoms. Wash your hands often if contact occurs. There is no clear evidence that vitamin C, vitamin E, echinacea, or exercise reduces the chance of developing a cold. However, it is always recommended to get plenty of rest and practice good nutrition. TREATMENT  Treatment is directed at relieving symptoms. There is no cure. Antibiotics are not effective, because the infection is caused by a virus, not by bacteria. Treatment may include:  Increased fluid intake. Sports drinks offer valuable electrolytes, sugars, and fluids.  Breathing heated mist or steam (vaporizer or shower).  Eating chicken soup or other clear broths, and maintaining good nutrition.  Getting plenty of rest.  Using gargles or lozenges for comfort.  Controlling fevers with ibuprofen or acetaminophen as directed by your caregiver.  Increasing usage of your inhaler if you have asthma. Zinc gel and zinc lozenges, taken in the first 24 hours of the common cold, can shorten the duration and lessen the severity of symptoms. Pain medicines may help with fever, muscle aches, and throat pain. A variety of non-prescription medicines are available to treat congestion and runny nose. Your caregiver   can make recommendations and may suggest nasal or lung inhalers for other symptoms.  HOME CARE INSTRUCTIONS   Only take over-the-counter or prescription medicines for pain, discomfort, or fever as directed by your  caregiver.  Use a warm mist humidifier or inhale steam from a shower to increase air moisture. This may keep secretions moist and make it easier to breathe.  Drink enough water and fluids to keep your urine clear or pale yellow.  Rest as needed.  Return to work when your temperature has returned to normal or as your caregiver advises. You may need to stay home longer to avoid infecting others. You can also use a face mask and careful hand washing to prevent spread of the virus. SEEK MEDICAL CARE IF:   After the first few days, you feel you are getting worse rather than better.  You need your caregiver's advice about medicines to control symptoms.  You develop chills, worsening shortness of breath, or brown or red sputum. These may be signs of pneumonia.  You develop yellow or brown nasal discharge or pain in the face, especially when you bend forward. These may be signs of sinusitis.  You develop a fever, swollen neck glands, pain with swallowing, or white areas in the back of your throat. These may be signs of strep throat. SEEK IMMEDIATE MEDICAL CARE IF:   You have a fever.  You develop severe or persistent headache, ear pain, sinus pain, or chest pain.  You develop wheezing, a prolonged cough, cough up blood, or have a change in your usual mucus (if you have chronic lung disease).  You develop sore muscles or a stiff neck. Document Released: 01/04/2001 Document Revised: 10/03/2011 Document Reviewed: 10/16/2013 [employer] Patient Information 2015 [employer]. This information is not intended to replace advice given to you by your health care provider. Make sure you discuss any questions you have with your health care provider.  

## 2014-07-16 NOTE — Progress Notes (Signed)
   Subjective:    Patient ID: Janice Dudley, female    DOB: 1951/06/12, 63 y.o.   MRN: [id number]  HPI  Pt is a 63 yo women who presents to the clinic to follow up on weight loss with phentermine. Down another 4lbs. Denies any side effects. Feeling more energy. Eating less and walking more.   She has had a cough for last week. Production has increased recently. No SOB or wheezing. No fever. Pressure behind ear but denies any sinus pressure. Tried robatussin with little relief. Minimal ST. Hx of pneumonia   Review of Systems  All other systems reviewed and are negative.      Objective:   Physical Exam  Constitutional: She is oriented to person, place, and time. She appears well-developed and well-nourished.  HENT:  Head: Normocephalic and atraumatic.  Right Ear: External ear normal.  Left Ear: External ear normal.  Nose: Nose normal.  Mouth/Throat: Oropharynx is clear and moist.  Eyes: Conjunctivae are normal. Right eye exhibits no discharge. Left eye exhibits no discharge.  Neck: Normal range of motion. Neck supple.  Cardiovascular: Normal rate, regular rhythm and normal heart sounds.   Pulmonary/Chest: Effort normal and breath sounds normal. She has no wheezes.  Dry cough on exam today.   Lymphadenopathy:    She has no cervical adenopathy.  Neurological: She is alert and oriented to person, place, and time.  Skin: Skin is dry.  Psychiatric: She has a normal mood and affect. Her behavior is normal.          Assessment & Plan:  Obesity/abnormal weight gain- down another 4lbs. Refilled. Keep up good work.   Acute URI- discussed symptoms seem consistent with cold. Can last 8-10 days. Consider zinc and vitamin C. Gave HO. Gave cough syrup for bedtime and tessalon pearls during the day. Encouraged honey and tea. zpak given if symptoms worsen due to holiday.

## 2014-08-20 ENCOUNTER — Encounter: Payer: Self-pay | Admitting: Physician Assistant

## 2015-01-15 ENCOUNTER — Other Ambulatory Visit: Payer: Self-pay | Admitting: Obstetrics & Gynecology

## 2015-01-16 LAB — CYTOLOGY - PAP

## 2016-01-01 ENCOUNTER — Encounter: Payer: Self-pay | Admitting: Emergency Medicine

## 2016-01-01 ENCOUNTER — Emergency Department
Admission: EM | Admit: 2016-01-01 | Discharge: 2016-01-01 | Disposition: A | Discharge status: Home / Self Care | Payer: BC Managed Care – PPO | Source: Home / Self Care | Attending: Family Medicine | Admitting: Family Medicine

## 2016-01-01 DIAGNOSIS — M7522 Bicipital tendinitis, left shoulder: Secondary | ICD-10-CM | POA: Diagnosis not present

## 2016-01-01 DIAGNOSIS — R0602 Shortness of breath: Secondary | ICD-10-CM | POA: Diagnosis not present

## 2016-01-01 NOTE — Discharge Instructions (Signed)
Apply ice pack for 20 to 30 minutes, 3 to 4 times daily  Continue until pain decreases.  Begin Ibuprofen 200mg , 4 tabs every 8 hours with food.  Begin stretching and range of motion exercises as tolerated.  Consider using a backpack that distributes weight equally on both shoulders. Recommend cardiac evaluation.   Bicipital Tendonitis Bicipital tendonitis refers to redness, soreness, and swelling (inflammation) or irritation of the bicep tendon. The biceps muscle is located between the elbow and shoulder of the inner arm. The tendon heads, similar to pieces of rope, connect the bicep muscle to the shoulder socket. They are called short head and long head tendons. When tendonitis occurs, the long head tendon is inflamed and swollen, and may be thickened or partially torn.  Bicipital tendonitis can occur with other problems as well, such as arthritis in the shoulder or acromioclavicular joints, tears in the tendons, or other rotator cuff problems.  CAUSES  Overuse of of the arms for overhead activities is the major cause of tendonitis. Many athletes, such as swimmers, baseball players, and tennis players are prone to bicipital tendonitis. Jobs that require manual labor or routine chores, especially chores involving overhead activities can result in overuse and tendonitis. SYMPTOMS Symptoms may include:  Pain in and around the front of the shoulder. Pain may be worse with overhead motion.  Pain or aching that radiates down the arm.  Clicking or shifting sensations in the shoulder. DIAGNOSIS Your caregiver may perform the following:  Physical exam and tests of the biceps and shoulder to observe range of motion, strength, and stability.  X-rays or magnetic resonance imaging (MRI) to confirm the diagnosis. In most common cases, these tests are not necessary. Since other problems may exist in the shoulder or rotator cuff, additional tests may be recommended. TREATMENT Treatment may include the  following:  Medications  Your caregiver may prescribe over-the-counter pain relievers.  Steroid injections, such as cortisone, may be recommended. These may help to reduce inflammation and pain.  Physical Therapy - Your caregiver may recommend gentle exercises with the arm. These can help restore strength and range of motion. They may be done at home or with a physical therapist's supervision and input.  Surgery - Arthroscopic or open surgery sometimes is necessary. Surgery may include:  Reattachment or repair of the tendon at the shoulder socket.  Removal of the damaged section of the tendon.  Anchoring the tendon to a different area of the shoulder (tenodesis). HOME CARE INSTRUCTIONS   Avoid overhead motion of the affected arm or any other motion that causes pain.  Take medication for pain as directed. Do not take these for more than 3 weeks, unless directed to do so by your caregiver.  Ice the affected area for 20 minutes at a time, 3-4 times per day. Place a towel on the skin over the painful area and the ice or cold pack over the towel. Do not place ice directly on the skin.  Perform gentle exercises at home as directed. These will increase strength and flexibility. PREVENTION  Modify your activities as much as possible to protect your arm. A physical therapist or sports medicine physician can help you understand options for safe motion.  Avoid repetitive overhead pulling, lifting, reaching, and throwing until your caregiver tells you it is ok to resume these activities. SEEK MEDICAL CARE IF:  Your pain worsens.  You have difficulty moving the affected arm.  You have trouble performing any of the self-care instructions. MAKE SURE YOU:  Understand these instructions.  Will watch your condition.  Will get help right away if you are not doing well or get worse.   This information is not intended to replace advice given to you by your health care provider. Make sure  you discuss any questions you have with your health care provider.   Document Released: 08/13/2010 Document Revised: 10/03/2011 Document Reviewed: 01/28/2015 Elsevier Interactive Patient Education [employer].

## 2016-01-01 NOTE — ED Notes (Signed)
Alert and oriented x 3; no risks positive during stroke assessment.

## 2016-01-01 NOTE — ED Notes (Signed)
Reports numb sensation of left arm today and 2 other occasions over past 2 weeks; mentioned feeling a little short of breath.

## 2016-01-01 NOTE — ED Provider Notes (Signed)
CSN: [id number]     Arrival date & time 01/01/16  1637 History   First MD Initiated Contact with Patient 01/01/16 1703     Chief Complaint  Patient presents with  . Extremity Weakness      HPI Comments: Patient reports that at 3:30pm she awakened from a nap and experienced numbness/tingling in her left arm that lasted about 30 minutes before resolving.  She had a similar episode several days ago.  She felt mildly short of breath during the sensation of tingling.  No other neurologic symptoms.  No chest pain.  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 Family history of MI in maternal relatives.  The history is provided by the patient.    Past Medical History  Diagnosis Date  . Hyperlipidemia    Past Surgical History  Procedure Laterality Date  . Laparoscopy    . Breast lumpectomy    . Cesarean section     Family History  Problem Relation Age of Onset  . Stroke Mother 57  . Heart attack Mother 8  . Cancer Mother 20    Uterine  . Heart attack Father 33  . Cancer Father 42    Lung  . Stroke Father 54  . Cancer Sister 84    Breast and Uterine   . Hyperlipidemia Sister   . Thyroid nodules Brother   . Hyperlipidemia Brother   . Hyperlipidemia Sister   . Hyperlipidemia Brother   . Hyperlipidemia Brother    Social History  Substance Use Topics  . Smoking status: Former Smoker -- 0.25 packs/day for 20 years    Types: Cigarettes  . Smokeless tobacco: Never Used  . Alcohol Use: Yes   OB History    No data available     Review of Systems  Constitutional: Negative for fever, chills, diaphoresis, activity change and fatigue.  HENT: Negative.   Eyes: Negative.   Respiratory: Positive for shortness of breath. Negative for cough, chest tightness and wheezing.   Cardiovascular: Negative for chest pain, palpitations and leg swelling.  Gastrointestinal: Negative.   Genitourinary: Negative.    Musculoskeletal:       Left shoulder soreness  Skin: Negative.   Neurological: Positive for numbness. Negative for dizziness, facial asymmetry, speech difficulty, weakness, light-headedness and headaches.    Allergies  Peach and Shellfish allergy  Home Medications   Prior to Admission medications   Medication Sig Start Date End Date Taking? Authorizing Provider  aspirin 81 MG tablet Take 81 mg by mouth daily.    Historical Provider, MD  azithromycin (ZITHROMAX) 250 MG tablet 2 tablets now and then one tablet for 4 days. 07/16/14   Jade L Breeback, PA-C  benzonatate (TESSALON) 200 MG capsule Take 1 capsule (200 mg total) by mouth 2 (two) times daily as needed for cough. 07/16/14   Jade L Breeback, PA-C  calcium carbonate (TUMS SMOOTHIES) 750 MG chewable tablet Chew 1 tablet by mouth daily.    Historical Provider, MD  cholecalciferol (VITAMIN D) 1000 UNITS tablet Take 1,000 Units by mouth daily.    Historical Provider, MD  diphenhydrAMINE (SOMINEX) 25 MG tablet Take 25 mg by mouth at bedtime as needed for sleep.    Historical Provider, MD  HYDROcodone-homatropine (HYCODAN) 5-1.5 MG/5ML syrup Take 5 mLs by mouth at bedtime as needed. 07/16/14   Jade L Breeback, PA-C  Magnesium 250 MG TABS Take by mouth.  Historical Provider, MD  phentermine (ADIPEX-P) 37.5 MG tablet Take 1 tablet (37.5 mg total) by mouth daily before breakfast. 07/16/14   [name], PA-C   Meds Ordered and Administered this Visit  Medications - No data to display  BP 126/83 mmHg  Temp(Src) 97.6 F (36.4 C) (Oral)  Resp 16  Ht 5\' 2"  (1.575 m)  Wt 175 lb (79.379 kg)  BMI 32.00 kg/m2  SpO2 96% No data found.   Physical Exam Nursing notes and Vital Signs reviewed. Appearance:  Patient appears stated age, and in no acute distress.  Patient is obese (BMI 32.0) Eyes:  Pupils are equal, round, and reactive to light and accomodation.  Extraocular movement is intact.  Conjunctivae are not inflamed  Nose:   Normal.  No sinus tenderness.  Pharynx:  Normal Neck:  Supple.  No adenopathy  Lungs:  Clear to auscultation.  Breath sounds are equal.  Moving air well. Heart:  Regular rate and rhythm without murmurs, rubs, or gallops.  Abdomen:  Nontender without masses or hepatosplenomegaly.  Bowel sounds are present.  No CVA or flank tenderness.  Extremities:  No edema.  Left shoulder:  Decreased range of motion; There is distinct tenderness to palpation over the insertion of the long head of the biceps tendon.  Skin:  No rash present.  Neurologic:  Distal neurovascular function is intact left upper extremitiy.  ED Course  Procedures none    Labs Reviewed -  EKG: Rate:  76 BPM PR:  154 msec QT:  398 msec QTcH:  426 msec QRSD:  98 msec QRS axis:  -14 degrees Interpretation:  Normal sinus rhythm; no acute changes    No significant change from EKG done 08/17/2011   MDM   1. Biceps tendonitis on left      Apply ice pack for 20 to 30 minutes, 3 to 4 times daily  Continue until pain decreases.  Begin Ibuprofen 200mg , 4 tabs every 8 hours with food.  Begin stretching and range of motion exercises as tolerated.  Consider using a backpack that distributes weight equally on both shoulders. Recommend cardiac evaluation. Followup with Dr. Aundria Mems or Dr. Lynne Leader ([facility]) if not improving about two weeks.  If symptoms become significantly worse during the night or over the weekend, proceed to the local emergency room.    Kandra Nicolas, MD 01/09/16 1039

## 2016-03-02 ENCOUNTER — Encounter: Payer: Self-pay | Admitting: Physician Assistant

## 2016-03-02 ENCOUNTER — Ambulatory Visit (INDEPENDENT_AMBULATORY_CARE_PROVIDER_SITE_OTHER): Payer: BC Managed Care – PPO | Admitting: Physician Assistant

## 2016-03-02 VITALS — BP 138/71 | HR 68 | Ht 62.0 in | Wt 177.0 lb

## 2016-03-02 DIAGNOSIS — E669 Obesity, unspecified: Secondary | ICD-10-CM | POA: Diagnosis not present

## 2016-03-02 DIAGNOSIS — Z780 Asymptomatic menopausal state: Secondary | ICD-10-CM | POA: Insufficient documentation

## 2016-03-02 DIAGNOSIS — Z131 Encounter for screening for diabetes mellitus: Secondary | ICD-10-CM

## 2016-03-02 DIAGNOSIS — Z13220 Encounter for screening for lipoid disorders: Secondary | ICD-10-CM | POA: Diagnosis not present

## 2016-03-02 DIAGNOSIS — Z0000 Encounter for general adult medical examination without abnormal findings: Secondary | ICD-10-CM

## 2016-03-02 DIAGNOSIS — L819 Disorder of pigmentation, unspecified: Secondary | ICD-10-CM

## 2016-03-02 DIAGNOSIS — Z1159 Encounter for screening for other viral diseases: Secondary | ICD-10-CM

## 2016-03-02 MED ORDER — PHENTERMINE HCL 37.5 MG PO TABS: 37.5000 mg | ORAL_TABLET | Freq: Every day | ORAL | 0 refills | Status: DC

## 2016-03-02 NOTE — Patient Instructions (Signed)

## 2016-03-03 LAB — COMPLETE METABOLIC PANEL WITH GFR
ALT: 17 U/L (ref 6–29)
AST: 18 U/L (ref 10–35)
Albumin: 4.3 g/dL (ref 3.6–5.1)
Alkaline Phosphatase: 65 U/L (ref 33–130)
BUN: 16 mg/dL (ref 7–25)
CHLORIDE: 103 mmol/L (ref 98–110)
CO2: 27 mmol/L (ref 20–31)
Calcium: 9.7 mg/dL (ref 8.6–10.4)
Creat: 0.89 mg/dL (ref 0.50–0.99)
GFR, EST AFRICAN AMERICAN: 80 mL/min (ref >=60)
GFR, EST NON AFRICAN AMERICAN: 69 mL/min (ref >=60)
Glucose, Bld: 93 mg/dL (ref 65–99)
Potassium: 4.6 mmol/L (ref 3.5–5.3)
Sodium: 137 mmol/L (ref 135–146)
Total Bilirubin: 0.6 mg/dL (ref 0.2–1.2)
Total Protein: 6.8 g/dL (ref 6.1–8.1)

## 2016-03-03 LAB — LIPID PANEL
Cholesterol: 245 mg/dL — ABNORMAL HIGH (ref 125–200)
HDL: 54 mg/dL (ref >=46)
LDL CALC: 161 mg/dL — AB (ref <130)
TRIGLYCERIDES: 152 mg/dL — AB (ref <150)
Total CHOL/HDL Ratio: 4.5 Ratio (ref <=5.0)
VLDL: 30 mg/dL (ref <30)

## 2016-03-04 LAB — VITAMIN D 25 HYDROXY (VIT D DEFICIENCY, FRACTURES): Vit D, 25-Hydroxy: 40 ng/mL (ref 30–100)

## 2016-03-04 LAB — HEPATITIS C ANTIBODY: HCV Ab: NEGATIVE

## 2016-03-04 MED ORDER — ATORVASTATIN CALCIUM 40 MG PO TABS: 40.0000 mg | ORAL_TABLET | Freq: Every day | ORAL | 3 refills | Status: DC

## 2016-03-04 NOTE — Addendum Note (Signed)
Addended by: Silverio Decamp on: 03/04/2016 04:17 PM   Modules accepted: Orders

## 2016-03-04 NOTE — Progress Notes (Signed)
Subjective:     Janice Dudley is a 65 y.o. female and is here for a comprehensive physical exam. The patient reports no problems.  Social History   Social History  . Marital status: Married    Spouse name: N/A  . Number of children: N/A  . Years of education: N/A   Occupational History  . Not on file.   Social History Main Topics  . Smoking status: Former Smoker    Packs/day: 0.25    Years: 20.00    Types: Cigarettes  . Smokeless tobacco: Never Used  . Alcohol use Yes  . Drug use: No  . Sexual activity: Yes   Other Topics Concern  . Not on file   Social History Narrative  . No narrative on file   Health Maintenance  Topic Date Due  . Hepatitis C Screening  1950/09/27  . MAMMOGRAM  07/26/2015  . INFLUENZA VACCINE  02/23/2016  . HIV Screening  03/02/2017 (Originally 04/17/1967)  . COLONOSCOPY  06/03/2017  . PAP SMEAR  01/14/2018  . TETANUS/TDAP  08/26/2023  . ZOSTAVAX  Completed    The following portions of the patient's history were reviewed and updated as appropriate: allergies, current medications, past family history, past medical history, past social history, past surgical history and problem list.  Review of Systems A comprehensive review of systems was negative.   Objective:    BP 138/71   Pulse 68   Ht 5\' 2"  (1.575 m)   Wt 177 lb (80.3 kg)   BMI 32.37 kg/m  General appearance: alert, cooperative and appears stated age Head: Normocephalic, without obvious abnormality, atraumatic Eyes: conjunctivae/corneas clear. PERRL, EOM's intact. Fundi benign. Ears: normal TM's and external ear canals both ears Nose: Nares normal. Septum midline. Mucosa normal. No drainage or sinus tenderness. Throat: lips, mucosa, and tongue normal; teeth and gums normal Neck: no adenopathy, no carotid bruit, no JVD, supple, symmetrical, trachea midline and thyroid not enlarged, symmetric, no tenderness/mass/nodules Back: symmetric, no curvature. ROM normal. No CVA  tenderness. Lungs: clear to auscultation bilaterally Heart: regular rate and rhythm, S1, S2 normal, no murmur, click, rub or gallop Abdomen: soft, non-tender; bowel sounds normal; no masses,  no organomegaly Extremities: extremities normal, atraumatic, no cyanosis or edema Pulses: 2+ and symmetric Skin: Skin color, texture, turgor normal. No rashes or lesions or left anterior lower leg 1cm by 1cm raised papule with hyperkeratoic surface.  Lymph nodes: Cervical, supraclavicular, and axillary nodes normal. Neurologic: Alert and oriented X 3, normal strength and tone. Normal symmetric reflexes. Normal coordination and gait    Assessment:    Healthy female exam.      Plan:  CPE- vaccines up to date. Lipid, cmp, TSH ordered. Hep C ordered.  Will call and get pap smear results as well mammogram.  Discussed to continue vitamin d and calcium. Encouraged regular exercise.   Obesity- restarted phentermine to use with a diet program she is doing.   Atypical pigmented skin lesion- shave biopsy done today. Sent off for cytology.  Shave Biopsy Procedure Note  Pre-operative Diagnosis: Suspicious lesion  Post-operative Diagnosis: same  Locations:left anterior lower leg  Indications: growing  Anesthesia: Lidocaine 2% with epinephrine without added sodium bicarbonate  Procedure Details  History of allergy to iodine: no  Patient informed of the risks (including bleeding and infection) and benefits of the  procedure and Verbal informed consent obtained.  The lesion and surrounding area were given a sterile prep using chlorhexidine and draped in the usual sterile  fashion. A scalpel was used to shave an area of skin approximately 1cm by 1cm.  Hemostasis achieved with alumuninum chloride. Antibiotic ointment and a sterile dressing applied.  The specimen was sent for pathologic examination. The patient tolerated the procedure well.  EBL:  scant  Condition: Stable  Complications: none.  Plan: 1. Instructed to keep the wound dry and covered for 24-48h and clean thereafter. 2. Warning signs of infection were reviewed.   3. Recommended that the patient use OTC acetaminophen as needed for pain.     See After Visit Summary for Counseling Recommendations

## 2016-04-15 ENCOUNTER — Ambulatory Visit (INDEPENDENT_AMBULATORY_CARE_PROVIDER_SITE_OTHER): Payer: BC Managed Care – PPO | Admitting: Physician Assistant

## 2016-04-15 DIAGNOSIS — E669 Obesity, unspecified: Secondary | ICD-10-CM

## 2016-04-15 MED ORDER — PHENTERMINE HCL 37.5 MG PO TABS: 37.5000 mg | ORAL_TABLET | Freq: Every day | ORAL | 0 refills | Status: DC

## 2016-04-15 NOTE — Progress Notes (Signed)
Janice Dudley presents to clinic for a BP and weight check.  Denies chest pain, SOB, palpitations, trouble sleeping.  Pt lost 4 lbs.  A refill will be sent to her [facility] pharmacy. Pt advised to make next appointment in 4 weeks. -[facility]/RMA

## 2016-05-13 ENCOUNTER — Ambulatory Visit (INDEPENDENT_AMBULATORY_CARE_PROVIDER_SITE_OTHER): Payer: BC Managed Care – PPO | Admitting: Family Medicine

## 2016-05-13 VITALS — BP 111/67 | HR 86 | Ht 62.0 in | Wt 172.0 lb

## 2016-05-13 DIAGNOSIS — R635 Abnormal weight gain: Secondary | ICD-10-CM

## 2016-05-13 DIAGNOSIS — Z6831 Body mass index (BMI) 31.0-31.9, adult: Secondary | ICD-10-CM

## 2016-05-13 MED ORDER — PHENTERMINE HCL 37.5 MG PO TABS: 37.5000 mg | ORAL_TABLET | Freq: Every day | ORAL | 0 refills | Status: DC

## 2016-05-13 NOTE — Progress Notes (Signed)
Pt notified of refill & recommendations.

## 2016-05-13 NOTE — Progress Notes (Signed)
Ok to refill but she needs to lose at least 3 lbs in the next month to refill.  She may wasn to use the smart phone appl called My Fitness pal to help her track calories and work on exercise for at least 30 min 5 days a week for weight loss.

## 2016-05-13 NOTE — Progress Notes (Signed)
Pt in for weight/bp check.  She has only lost 1lb since last check. She stated that they have gone out of town several times and hasn't been able to make it to the [facility] to exercise. Please advise about refill.

## 2017-03-28 ENCOUNTER — Ambulatory Visit (INDEPENDENT_AMBULATORY_CARE_PROVIDER_SITE_OTHER): Payer: BC Managed Care – PPO | Admitting: Physician Assistant

## 2017-03-28 ENCOUNTER — Encounter: Payer: Self-pay | Admitting: Physician Assistant

## 2017-03-28 VITALS — BP 108/59 | HR 72 | Temp 98.3°F | Wt 180.0 lb

## 2017-03-28 DIAGNOSIS — R300 Dysuria: Secondary | ICD-10-CM

## 2017-03-28 DIAGNOSIS — N3001 Acute cystitis with hematuria: Secondary | ICD-10-CM

## 2017-03-28 LAB — POCT URINALYSIS DIPSTICK
BILIRUBIN UA: NEGATIVE
Glucose, UA: NEGATIVE
Ketones, UA: NEGATIVE
Nitrite, UA: POSITIVE
PROTEIN UA: NEGATIVE
Spec Grav, UA: 1.01 (ref 1.010–1.025)
Urobilinogen, UA: 0.2 E.U./dL
pH, UA: 6.5 (ref 5.0–8.0)

## 2017-03-28 MED ORDER — NITROFURANTOIN MONOHYD MACRO 100 MG PO CAPS: 100.0000 mg | ORAL_CAPSULE | Freq: Two times a day (BID) | ORAL | 0 refills | Status: DC

## 2017-03-28 NOTE — Progress Notes (Signed)
   Subjective:    Patient ID: Janice Dudley, female    DOB: 04-28-1951, 66 y.o.   MRN: [id number]  HPI  Pt is a 65 yo female who presents to the clinic with dysuria for 4 days. She took azo over the weekend because she was out of town. She has some urinary urgency as well. No vaginal itching or discharge. No fever, chills, nausea or vomiting.   .. Active Ambulatory Problems    Diagnosis Date Noted  . Hyperlipidemia   . Fibrocystic breast 03/25/2014  . Hypertriglyceridemia 04/15/2014  . Obesity 04/15/2014  . Abnormal weight gain 05/16/2014  . Atypical pigmented skin lesion 03/02/2016  . Post-menopausal 03/02/2016   Resolved Ambulatory Problems    Diagnosis Date Noted  . Sprain of ankle 05/16/2014   Past Medical History:  Diagnosis Date  . Hyperlipidemia        Review of Systems See HPI>     Objective:   Physical Exam  Constitutional: She is oriented to person, place, and time. She appears well-developed and well-nourished.  HENT:  Head: Normocephalic and atraumatic.  Cardiovascular: Normal rate, regular rhythm and normal heart sounds.   Pulmonary/Chest: Effort normal and breath sounds normal. She has no wheezes.  No CVA tenderness.   Abdominal: Soft. Bowel sounds are normal. She exhibits no distension and no mass. There is tenderness. There is no rebound and no guarding.  Discomfort with palpation over the suprapubic area.   Neurological: She is alert and oriented to person, place, and time.  Psychiatric: She has a normal mood and affect. Her behavior is normal.          Assessment & Plan:  Marland KitchenMarland KitchenRhenda was seen today for urinary tract infection.  Diagnoses and all orders for this visit:  Dysuria -     Urinalysis Dipstick -     Urine Culture  Acute cystitis with hematuria -     nitrofurantoin, macrocrystal-monohydrate, (MACROBID) 100 MG capsule; Take 1 capsule (100 mg total) by mouth 2 (two) times daily. For 7 days.    Results for orders placed or  performed in visit on 03/28/17  Urinalysis Dipstick  Result Value Ref Range   Color, UA yellow    Clarity, UA clear    Glucose, UA negative    Bilirubin, UA negative    Ketones, UA negative    Spec Grav, UA 1.010 1.010 - 1.025   Blood, UA moderate    pH, UA 6.5 5.0 - 8.0   Protein, UA negative    Urobilinogen, UA 0.2 0.2 or 1.0 E.U./dL   Nitrite, UA positive    Leukocytes, UA Trace (A) Negative   Treated with macrobid for 7 days. Will culture.  Discussed symptomatic care.  Follow up as needed.

## 2017-03-29 LAB — URINE CULTURE

## 2017-06-07 ENCOUNTER — Encounter: Payer: Self-pay | Admitting: Physician Assistant

## 2017-06-07 ENCOUNTER — Other Ambulatory Visit: Payer: Self-pay | Admitting: Sports Medicine

## 2017-06-07 NOTE — Telephone Encounter (Signed)
To PCP

## 2017-06-12 ENCOUNTER — Encounter: Payer: Self-pay | Admitting: Gastroenterology

## 2017-06-22 ENCOUNTER — Ambulatory Visit: Payer: Medicare Other | Admitting: Physician Assistant

## 2017-06-22 ENCOUNTER — Encounter: Payer: Self-pay | Admitting: Physician Assistant

## 2017-06-22 VITALS — BP 137/68 | HR 69 | Ht 62.0 in | Wt 177.0 lb

## 2017-06-22 DIAGNOSIS — E781 Pure hyperglyceridemia: Secondary | ICD-10-CM | POA: Diagnosis not present

## 2017-06-22 DIAGNOSIS — E7800 Pure hypercholesterolemia, unspecified: Secondary | ICD-10-CM | POA: Diagnosis not present

## 2017-06-22 DIAGNOSIS — G479 Sleep disorder, unspecified: Secondary | ICD-10-CM | POA: Diagnosis not present

## 2017-06-22 DIAGNOSIS — R5383 Other fatigue: Secondary | ICD-10-CM

## 2017-06-22 DIAGNOSIS — Z131 Encounter for screening for diabetes mellitus: Secondary | ICD-10-CM | POA: Diagnosis not present

## 2017-06-22 DIAGNOSIS — E669 Obesity, unspecified: Secondary | ICD-10-CM | POA: Insufficient documentation

## 2017-06-22 MED ORDER — TRAZODONE HCL 50 MG PO TABS: 25.0000 mg | ORAL_TABLET | Freq: Every evening | ORAL | 2 refills | Status: DC | PRN

## 2017-06-22 NOTE — Progress Notes (Addendum)
Subjective:    Patient ID: Janice Dudley, female    DOB: 02/24/1951, 66 y.o.   MRN: [id number]  HPI Janice Dudley is a 66 y/o female who presents today for follow-up on cholesterol and medication refill. She has lost 3lbs since her last visit and is continuing to try to lose weight although reports that it is a struggle. She is staying active with her granddaughter and trying to eat healthy. She reports decreased energy and feeling more fatigued, especially at the end of the day. She is not sleeping well, she states she cannot fall asleep until 1-2am, however once she falls asleep she stays asleep and does not wake up during the night. She takes melatonin 5mg  nightly. She reports mild snoring but does not attribute it to her sleeping problems, her husband sleeps with the TV on and she is unable to sleep with the noise and light from the TV.  Pt needs labs to get Lipitor refilled.   .. Active Ambulatory Problems    Diagnosis Date Noted  . Hyperlipidemia   . Fibrocystic breast 03/25/2014  . Hypertriglyceridemia 04/15/2014  . Obesity 04/15/2014  . Abnormal weight gain 05/16/2014  . Atypical pigmented skin lesion 03/02/2016  . Post-menopausal 03/02/2016  . No energy 06/22/2017  . Trouble in sleeping 06/22/2017  . Obesity (BMI 30.0-34.9) 06/22/2017   Resolved Ambulatory Problems    Diagnosis Date Noted  . Sprain of ankle 05/16/2014   Past Medical History:  Diagnosis Date  . Hyperlipidemia       Review of Systems  Constitutional: Positive for fatigue. Negative for activity change.  Respiratory: Negative for apnea.   Psychiatric/Behavioral: Positive for sleep disturbance.       Objective:   Physical Exam  Constitutional: She is oriented to person, place, and time. She appears well-developed and well-nourished.  HENT:  Head: Normocephalic and atraumatic.  Neck: No thyromegaly present.  Cardiovascular: Normal rate, regular rhythm and normal heart sounds.  Pulmonary/Chest:  Effort normal and breath sounds normal.  Lymphadenopathy:    She has no cervical adenopathy.  Neurological: She is alert and oriented to person, place, and time.  Psychiatric: She has a normal mood and affect. Her behavior is normal. Thought content normal.  Vitals reviewed.     Assessment & Plan:  .Marland KitchenMarland KitchenZeynab was seen today for hyperlipidemia.  Diagnoses and all orders for this visit:  Pure hypercholesterolemia -     Lipid Panel w/reflex Direct LDL  Hypertriglyceridemia -     Lipid Panel w/reflex Direct LDL  No energy -     B12 -     TSH -     Vitamin D 1,25 dihydroxy -     CBC  Screening for diabetes mellitus -     COMPLETE METABOLIC PANEL WITH GFR  Trouble in sleeping -     traZODone (DESYREL) 50 MG tablet; Take 0.5-1 tablets (25-50 mg total) by mouth at bedtime as needed for sleep.  Obesity (BMI 30.0-34.9)   Hypercholesterolemia and hypertriglyceridemia - Fasting lipid panel today. Discussed with patient to continue taking Lipitor as prescribed.  Trouble sleeping and fatigue - Discussed with patient good sleep habits and creating a sleep routine, including habits such as no blue lights (TV and phone), reading a book before bed, or taking a relaxing bath before bed. I suspect her poor sleep is from her husband sleeping with the TV on, however we will check Vitamin D, B12, TSH, and CBC today to rule out other causes of fatigue.  Advised patient that she can increase her melatonin to 10mg  nightly to help with sleep. Also discussed with the patient trying Trazodone to help with sleep, she would like to try increasing the melatonin first and then switching to Trazodone as needed.  Obesity - hopefully as energy increases you will be more active and weight loss will occur.  Marland Kitchen.Discussed low carb diet with 1500 calories and 80g of protein.  Exercising at least 150 minutes a week.  My Fitness Pal could be a Microbiologist.

## 2017-06-28 LAB — COMPLETE METABOLIC PANEL WITH GFR
AG Ratio: 1.7 (calc) (ref 1.0–2.5)
ALBUMIN MSPROF: 4.3 g/dL (ref 3.6–5.1)
ALKALINE PHOSPHATASE (APISO): 67 U/L (ref 33–130)
ALT: 24 U/L (ref 6–29)
AST: 23 U/L (ref 10–35)
BILIRUBIN TOTAL: 0.7 mg/dL (ref 0.2–1.2)
BUN: 19 mg/dL (ref 7–25)
CO2: 27 mmol/L (ref 20–32)
CREATININE: 0.76 mg/dL (ref 0.50–0.99)
Calcium: 9.7 mg/dL (ref 8.6–10.4)
Chloride: 103 mmol/L (ref 98–110)
GFR, EST AFRICAN AMERICAN: 95 mL/min/{1.73_m2} (ref >=60)
GFR, Est Non African American: 82 mL/min/{1.73_m2} (ref >=60)
GLOBULIN: 2.6 g/dL (ref 1.9–3.7)
GLUCOSE: 95 mg/dL (ref 65–99)
Potassium: 4.3 mmol/L (ref 3.5–5.3)
SODIUM: 137 mmol/L (ref 135–146)
TOTAL PROTEIN: 6.9 g/dL (ref 6.1–8.1)

## 2017-06-28 LAB — CBC
HCT: 39.7 % (ref 35.0–45.0)
HEMOGLOBIN: 13.6 g/dL (ref 11.7–15.5)
MCH: 32.2 pg (ref 27.0–33.0)
MCHC: 34.3 g/dL (ref 32.0–36.0)
MCV: 94.1 fL (ref 80.0–100.0)
MPV: 10.2 fL (ref 7.5–12.5)
Platelets: 288 10*3/uL (ref 140–400)
RBC: 4.22 10*6/uL (ref 3.80–5.10)
RDW: 12.1 % (ref 11.0–15.0)
WBC: 5.2 10*3/uL (ref 3.8–10.8)

## 2017-06-28 LAB — LIPID PANEL W/REFLEX DIRECT LDL
CHOLESTEROL: 159 mg/dL (ref <200)
HDL: 62 mg/dL (ref >50)
LDL Cholesterol (Calc): 76 mg/dL (calc)
Non-HDL Cholesterol (Calc): 97 mg/dL (calc) (ref <130)
Total CHOL/HDL Ratio: 2.6 (calc) (ref <5.0)
Triglycerides: 120 mg/dL (ref <150)

## 2017-06-28 LAB — VITAMIN B12: Vitamin B-12: 493 pg/mL (ref 200–1100)

## 2017-06-28 LAB — VITAMIN D 1,25 DIHYDROXY
Vitamin D 1, 25 (OH)2 Total: 24 pg/mL (ref 18–72)
Vitamin D2 1, 25 (OH)2: <8 pg/mL
Vitamin D3 1, 25 (OH)2: 24 pg/mL

## 2017-06-28 LAB — TSH: TSH: 3.1 m[IU]/L (ref 0.40–4.50)

## 2017-09-20 LAB — HM MAMMOGRAPHY

## 2017-09-27 ENCOUNTER — Ambulatory Visit: Payer: Medicare Other | Admitting: Physician Assistant

## 2017-09-27 ENCOUNTER — Encounter: Payer: Self-pay | Admitting: Physician Assistant

## 2017-09-27 ENCOUNTER — Telehealth: Payer: Self-pay | Admitting: Physician Assistant

## 2017-09-27 VITALS — BP 129/72 | HR 72 | Ht 62.0 in | Wt 182.0 lb

## 2017-09-27 DIAGNOSIS — E669 Obesity, unspecified: Secondary | ICD-10-CM | POA: Diagnosis not present

## 2017-09-27 DIAGNOSIS — G479 Sleep disorder, unspecified: Secondary | ICD-10-CM | POA: Diagnosis not present

## 2017-09-27 DIAGNOSIS — Z23 Encounter for immunization: Secondary | ICD-10-CM

## 2017-09-27 DIAGNOSIS — S39011A Strain of muscle, fascia and tendon of abdomen, initial encounter: Secondary | ICD-10-CM | POA: Diagnosis not present

## 2017-09-27 DIAGNOSIS — E781 Pure hyperglyceridemia: Secondary | ICD-10-CM | POA: Diagnosis not present

## 2017-09-27 DIAGNOSIS — E782 Mixed hyperlipidemia: Secondary | ICD-10-CM | POA: Diagnosis not present

## 2017-09-27 MED ORDER — ATORVASTATIN CALCIUM 40 MG PO TABS: ORAL_TABLET | ORAL | 3 refills | Status: DC

## 2017-09-27 MED ORDER — TRAZODONE HCL 50 MG PO TABS: ORAL_TABLET | ORAL | 1 refills | Status: DC

## 2017-09-27 MED ORDER — AMBULATORY NON FORMULARY MEDICATION: 0 refills | Status: DC

## 2017-09-27 NOTE — Progress Notes (Signed)
Subjective:     Patient ID: Janice Dudley, female   DOB: 08/02/1950, 67 y.o.   MRN: [id number]  HPI   Janice Dudley is a 67 year old female with hyperlipidemia, obesity, is presented to the office for follow up visit and need refills for Lipitor and Trazodone. Patient states feeling well with no health concern. Patient reports that her diet mainly consists of fresh veggie, fish, fruits, nuts and Hamas. Patient is not following any regular exercise regimen mainly due to weather, but will start regular exercise once weather gets better. Patient states trazodone and melatonin works well for her sleep and gets 5-6 hours of good quality sleep however she has hard time fall into sleep.   Patient also complains about pain at left side of lower abdomen. It started yesterday while  she was trying to sit up from laying down position while holding a baby. She rates pain 2-3/10 worsen while trying to sit up or turn. Patient denies chest pain, SOB, palpitation, wheezing, coughing, increase sputum production, hemoptysis, nausea, vomiting, diarrhea, constipation, no vision or hearing changes.   Patient had mammogram at [facility] [place] last week. Patient is scheduled for colonoscopy and bone density scan next week. Patient received flu shot this year but denies having any pneumonia shot in the past.  .. Active Ambulatory Problems    Diagnosis Date Noted  . Hyperlipidemia   . Fibrocystic breast 03/25/2014  . Hypertriglyceridemia 04/15/2014  . Obesity 04/15/2014  . Abnormal weight gain 05/16/2014  . Atypical pigmented skin lesion 03/02/2016  . Post-menopausal 03/02/2016  . No energy 06/22/2017  . Trouble in sleeping 06/22/2017  . Obesity (BMI 30.0-34.9) 06/22/2017  . Strain of abdominal muscle 09/28/2017   Resolved Ambulatory Problems    Diagnosis Date Noted  . Sprain of ankle 05/16/2014   Past Medical History:  Diagnosis Date  . Hyperlipidemia    Review of Systems   As stated in  HPI     Objective:   Physical Exam  Constitutional: She is oriented to person, place, and time. She appears well-developed and well-nourished. No distress.  HENT:  Head: Normocephalic and atraumatic.  Nose: Nose normal.  Eyes: Pupils are equal, round, and reactive to light. Right eye exhibits no discharge. Left eye exhibits no discharge. No scleral icterus.  Neck: Neck supple. No thyromegaly present.  Cardiovascular: Normal rate, regular rhythm and normal heart sounds. Exam reveals no gallop and no friction rub.  No murmur heard. Pulmonary/Chest: Effort normal and breath sounds normal. No respiratory distress. She has no wheezes. She has no rales. She exhibits no tenderness.  Abdominal: Soft. Bowel sounds are normal. She exhibits no distension and no mass. There is no tenderness. There is no rebound and no guarding.  Tenderness only when flexing abdomen to get up.   Neurological: She is alert and oriented to person, place, and time. Coordination normal.  Psychiatric: She has a normal mood and affect. Her behavior is normal. Judgment and thought content normal.       Assessment:     Marland KitchenMarland KitchenDiagnoses and all orders for this visit:  Strain of abdominal muscle, initial encounter  Trouble in sleeping -     traZODone (DESYREL) 50 MG tablet; Take 1-2 tablets as needed 1 hour before bedtime.  Obesity (BMI 30.0-34.9)  Mixed hyperlipidemia -     atorvastatin (LIPITOR) 40 MG tablet; TAKE 1 TABLET(40 MG) BY MOUTH DAILY  Hypertriglyceridemia -     atorvastatin (LIPITOR) 40 MG tablet; TAKE 1  TABLET(40 MG) BY MOUTH DAILY  Need for pneumococcal vaccination -     AMBULATORY NON FORMULARY MEDICATION; Need for prevnar 13 one injection IM for pneumonia prevention      Plan:     Patient complains about trouble initiating sleep. Trazodone dose increase is discussed with patient. Patient agreed with plan. Patient to take 100 mg of trazodone. Also advised patient to take it earlier before bedtime.   Patient was advised to take Prevnar 13. Efficacy of new Shingle vaccine Shingrix was discussed and encourage to receive it. Patient's left lower abdominal pain is likely from muscle strain while sitting up holding a child. Patient was encouraged to use higher strength Ibuprofen (800mg .)  and heat pads for symptom relief. Follow up if not improving.   Patient to continue using Atorvastatin 50 mg up to date labs. New prescription is filled. Patient had mammogram at [facility] [place].

## 2017-09-28 DIAGNOSIS — S39011A Strain of muscle, fascia and tendon of abdomen, initial encounter: Secondary | ICD-10-CM | POA: Insufficient documentation

## 2017-09-28 NOTE — Telephone Encounter (Signed)
[facility] at [facility].

## 2017-09-28 NOTE — Telephone Encounter (Signed)
Fax request was placed with front office this morning.

## 2017-10-04 ENCOUNTER — Encounter: Payer: Self-pay | Admitting: Gastroenterology

## 2017-10-23 ENCOUNTER — Encounter: Payer: Self-pay | Admitting: Physician Assistant

## 2017-11-29 ENCOUNTER — Ambulatory Visit (AMBULATORY_SURGERY_CENTER): Payer: Self-pay | Admitting: *Deleted

## 2017-11-29 ENCOUNTER — Other Ambulatory Visit: Payer: Self-pay

## 2017-11-29 VITALS — Ht 62.0 in | Wt 186.0 lb

## 2017-11-29 DIAGNOSIS — Z8739 Personal history of other diseases of the musculoskeletal system and connective tissue: Secondary | ICD-10-CM | POA: Insufficient documentation

## 2017-11-29 DIAGNOSIS — Z1211 Encounter for screening for malignant neoplasm of colon: Secondary | ICD-10-CM

## 2017-11-29 MED ORDER — NA SULFATE-K SULFATE-MG SULF 17.5-3.13-1.6 GM/177ML PO SOLN: ORAL | 0 refills | Status: DC

## 2017-11-29 NOTE — Progress Notes (Signed)
Patient denies any allergies to eggs or soy. Patient denies any problems with anesthesia/sedation. Patient denies any oxygen use at home. Patient denies taking any diet/weight loss medications or blood thinners. EMMI education assisgned to patient on colonoscopy, this was explained and instructions given to patient. Patient request low volume prep. Pt understands and is okay with paying for the Suprep.

## 2017-12-01 ENCOUNTER — Encounter: Payer: Self-pay | Admitting: Gastroenterology

## 2017-12-11 ENCOUNTER — Telehealth: Payer: Self-pay | Admitting: Gastroenterology

## 2017-12-12 NOTE — Telephone Encounter (Signed)
Ok, no charge  

## 2017-12-13 ENCOUNTER — Encounter: Payer: Medicare Other | Admitting: Gastroenterology

## 2017-12-18 ENCOUNTER — Encounter: Payer: Self-pay | Admitting: Gastroenterology

## 2017-12-19 ENCOUNTER — Encounter: Payer: Medicare Other | Admitting: Gastroenterology

## 2017-12-19 NOTE — Telephone Encounter (Signed)
Patient stated she CX her appointment on My Chart.  Her husband is very ill with a heart condition and she does not have a driver.

## 2018-03-22 ENCOUNTER — Encounter: Payer: Self-pay | Admitting: Gastroenterology

## 2018-05-09 ENCOUNTER — Ambulatory Visit (AMBULATORY_SURGERY_CENTER): Payer: Self-pay

## 2018-05-09 ENCOUNTER — Encounter: Payer: Self-pay | Admitting: Gastroenterology

## 2018-05-09 VITALS — Ht 62.0 in | Wt 186.8 lb

## 2018-05-09 DIAGNOSIS — Z1211 Encounter for screening for malignant neoplasm of colon: Secondary | ICD-10-CM

## 2018-05-09 NOTE — Progress Notes (Signed)
Per pt, no allergies to soy or egg products.Pt not taking any weight loss meds or using  O2 at home.  Emmi video sent to patient's email 

## 2018-05-23 ENCOUNTER — Encounter: Payer: Self-pay | Admitting: Gastroenterology

## 2018-05-23 ENCOUNTER — Ambulatory Visit (AMBULATORY_SURGERY_CENTER): Payer: Medicare Other | Admitting: Gastroenterology

## 2018-05-23 VITALS — BP 111/61 | HR 64 | Temp 96.8°F | Resp 11 | Ht 62.0 in | Wt 186.0 lb

## 2018-05-23 DIAGNOSIS — Z1211 Encounter for screening for malignant neoplasm of colon: Secondary | ICD-10-CM | POA: Diagnosis present

## 2018-05-23 MED ORDER — SODIUM CHLORIDE 0.9 % IV SOLN: 500.0000 mL | Freq: Once | INTRAVENOUS | Status: DC

## 2018-05-23 NOTE — Op Note (Signed)
[facility] Patient Name: Janice Dudley Procedure Date: 05/23/2018 11:07 AM MRN: [id number] Endoscopist: Milus Banister , MD Age: 67 Referring MD:  Date of Birth: November 12, 1950 Gender: Female Account #: [id number] Procedure:                Colonoscopy Indications:              Screening for colorectal malignant neoplasm Medicines:                Monitored Anesthesia Care Procedure:                Pre-Anesthesia Assessment:                           - Prior to the procedure, a History and Physical                            was performed, and patient medications and                            allergies were reviewed. The patient's tolerance of                            previous anesthesia was also reviewed. The risks                            and benefits of the procedure and the sedation                            options and risks were discussed with the patient.                            All questions were answered, and informed consent                            was obtained. Prior Anticoagulants: The patient has                            taken no previous anticoagulant or antiplatelet                            agents. ASA Grade Assessment: II - A patient with                            mild systemic disease. After reviewing the risks                            and benefits, the patient was deemed in                            satisfactory condition to undergo the procedure.                           After obtaining informed consent, the colonoscope  was passed under direct vision. Throughout the                            procedure, the patient's blood pressure, pulse, and                            oxygen saturations were monitored continuously. The                            Model PCF-[id number]) scope was introduced                            through the anus and advanced to the the cecum,                            identified by  appendiceal orifice and ileocecal                            valve. The colonoscopy was performed without                            difficulty. The patient tolerated the procedure                            well. The quality of the bowel preparation was                            good. The ileocecal valve, appendiceal orifice, and                            rectum were photographed. Scope In: 11:14:02 AM Scope Out: 11:32:25 AM Scope Withdrawal Time: 0 hours 11 minutes 35 seconds  Total Procedure Duration: 0 hours 18 minutes 23 seconds  Findings:                 The entire examined colon appeared normal on direct                            and retroflexion views. Complications:            No immediate complications. Estimated blood loss:                            None. Estimated Blood Loss:     Estimated blood loss: none. Impression:               - The entire examined colon is normal on direct and                            retroflexion views.                           - No polyps or cancers. Recommendation:           - Patient has a contact number available for  emergencies. The signs and symptoms of potential                            delayed complications were discussed with the                            patient. Return to normal activities tomorrow.                            Written discharge instructions were provided to the                            patient.                           - Resume previous diet.                           - Continue present medications.                           - Repeat colonoscopy in 10 years for screening. Milus Banister, MD 05/23/2018 11:34:19 AM This report has been signed electronically.

## 2018-05-23 NOTE — Patient Instructions (Signed)
YOU HAD AN ENDOSCOPIC PROCEDURE TODAY AT THE [facility]:   Refer to the procedure report that was given to you for any specific questions about what was found during the examination.  If the procedure report does not answer your questions, please call your gastroenterologist to clarify.  If you requested that your care partner not be given the details of your procedure findings, then the procedure report has been included in a sealed envelope for you to review at your convenience later.  YOU SHOULD EXPECT: Some feelings of bloating in the abdomen. Passage of more gas than usual.  Walking can help get rid of the air that was put into your GI tract during the procedure and reduce the bloating. If you had a lower endoscopy (such as a colonoscopy or flexible sigmoidoscopy) you may notice spotting of blood in your stool or on the toilet paper. If you underwent a bowel prep for your procedure, you may not have a normal bowel movement for a few days.  Please Note:  You might notice some irritation and congestion in your nose or some drainage.  This is from the oxygen used during your procedure.  There is no need for concern and it should clear up in a day or so.  SYMPTOMS TO REPORT IMMEDIATELY:   Following lower endoscopy (colonoscopy or flexible sigmoidoscopy):  Excessive amounts of blood in the stool  Significant tenderness or worsening of abdominal pains  Swelling of the abdomen that is new, acute  Fever of 100F or higher  For urgent or emergent issues, a gastroenterologist can be reached at any hour by calling [phone].   DIET:  We do recommend a small meal at first, but then you may proceed to your regular diet.  Drink plenty of fluids but you should avoid alcoholic beverages for 24 hours.  ACTIVITY:  You should plan to take it easy for the rest of today and you should NOT DRIVE or use heavy machinery until tomorrow (because of the sedation medicines used during the test).     FOLLOW UP: Our staff will call the number listed on your records the next business day following your procedure to check on you and address any questions or concerns that you may have regarding the information given to you following your procedure. If we do not reach you, we will leave a message.  However, if you are feeling well and you are not experiencing any problems, there is no need to return our call.  We will assume that you have returned to your regular daily activities without incident.  If any biopsies were taken you will be contacted by phone or by letter within the next 1-3 weeks.  Please call us at [phone] if you have not heard about the biopsies in 3 weeks.    SIGNATURES/CONFIDENTIALITY: You and/or your care partner have signed paperwork which will be entered into your electronic medical record.  These signatures attest to the fact that that the information above on your After Visit Summary has been reviewed and is understood.  Full responsibility of the confidentiality of this discharge information lies with you and/or your care-partner. 

## 2018-05-23 NOTE — Progress Notes (Signed)
Report given to PACU, vss 

## 2018-05-24 ENCOUNTER — Telehealth: Payer: Self-pay | Admitting: *Deleted

## 2018-05-24 NOTE — Telephone Encounter (Signed)
  Follow up Call-  Call back number 05/23/2018  Post procedure Call Back phone  # [phone]  Permission to leave phone message Yes  Some recent data might be hidden     Patient questions:  Do you have a fever, pain , or abdominal swelling? No. Pain Score  0 *  Have you tolerated food without any problems? Yes.    Have you been able to return to your normal activities? Yes.    Do you have any questions about your discharge instructions: Diet   No. Medications  No. Follow up visit  No.  Do you have questions or concerns about your Care? No.  Actions: * If pain score is 4 or above: No action needed, pain <4.

## 2018-05-30 NOTE — Progress Notes (Deleted)
Subjective:   Janice Dudley is a 67 y.o. female who presents for an Initial Medicare Annual Wellness Visit.  Review of Systems    No ROS.  Medicare Wellness Visit. Additional risk factors are reflected in the social history.     Sleep patterns:   Home Safety/Smoke Alarms: Feels safe in home. Smoke alarms in place.  Living environment;     Female:   Pap- aged out unless necessary      Mammo-  utd     Dexa scan-        CCS- utd     Objective:    There were no vitals filed for this visit. There is no height or weight on file to calculate BMI.  Advanced Directives 03/12/2014  Does Patient Have a Medical Advance Directive? No    Current Medications (verified) Outpatient Encounter Medications as of 06/05/2018  Medication Sig  . AMBULATORY NON FORMULARY MEDICATION Need for prevnar 13 one injection IM for pneumonia prevention (Patient not taking: Reported on 11/29/2017)  . aspirin 81 MG tablet Take 81 mg by mouth daily.  Marland Kitchen atorvastatin (LIPITOR) 40 MG tablet TAKE 1 TABLET(40 MG) BY MOUTH DAILY  . b complex vitamins tablet Take 1 tablet by mouth daily.  . calcium carbonate (TUMS SMOOTHIES) 750 MG chewable tablet Chew 1 tablet by mouth daily.  . cholecalciferol (VITAMIN D) 1000 UNITS tablet Take 1,000 Units by mouth daily.  Marland Kitchen KRILL OIL PO Take by mouth.  . Magnesium 250 MG TABS Take by mouth daily.   Marland Kitchen MELATONIN PO Take 10 mg by mouth at bedtime.   . traZODone (DESYREL) 50 MG tablet Take 1-2 tablets as needed 1 hour before bedtime.   No facility-administered encounter medications on file as of 06/05/2018.     Allergies (verified) Peach [prunus persica] and Shellfish allergy   History: Past Medical History:  Diagnosis Date  . Cataract    no surgery yet  . Hyperlipidemia    Past Surgical History:  Procedure Laterality Date  . BREAST LUMPECTOMY  1999   left breast/benign  . CESAREAN SECTION  1982/1983   2 times  . COLONOSCOPY  2008  . LAPAROSCOPY     abd/tubal  ligation  . TUBAL LIGATION  1988   Family History  Problem Relation Age of Onset  . Stroke Mother 67  . Heart attack Mother 17  . Cancer Mother 24       Uterine  . Heart attack Father 23  . Cancer Father 11       Lung  . Stroke Father 11  . Cancer Sister 51       Breast and Uterine   . Hyperlipidemia Sister   . Thyroid nodules Brother   . Hyperlipidemia Brother   . Thyroid cancer Brother   . Lung cancer Brother   . Prostate cancer Brother   . Hyperlipidemia Sister   . Hyperlipidemia Brother   . Hyperlipidemia Brother   . Colon cancer Neg Hx   . Esophageal cancer Neg Hx   . Rectal cancer Neg Hx    Social History   Socioeconomic History  . Marital status: Married    Spouse name: Not on file  . Number of children: Not on file  . Years of education: Not on file  . Highest education level: Not on file  Occupational History  . Not on file  Social Needs  . Financial resource strain: Not on file  . Food insecurity:    Worry:  Not on file    Inability: Not on file  . Transportation needs:    Medical: Not on file    Non-medical: Not on file  Tobacco Use  . Smoking status: Former Smoker    Packs/day: 0.25    Years: 20.00    Pack years: 5.00    Types: Cigarettes    Last attempt to quit: 05/09/1989    Years since quitting: 29.0  . Smokeless tobacco: Never Used  Substance and Sexual Activity  . Alcohol use: Yes    Alcohol/week: 7.0 standard drinks    Types: 7 Glasses of wine per week    Comment: wine   . Drug use: No  . Sexual activity: Yes  Lifestyle  . Physical activity:    Days per week: Not on file    Minutes per session: Not on file  . Stress: Not on file  Relationships  . Social connections:    Talks on phone: Not on file    Gets together: Not on file    Attends religious service: Not on file    Active member of club or organization: Not on file    Attends meetings of clubs or organizations: Not on file    Relationship status: Not on file  Other  Topics Concern  . Not on file  Social History Narrative  . Not on file    Tobacco Counseling Counseling given: Not Answered   Clinical Intake:                        Activities of Daily Living No flowsheet data found.   Immunizations and Health Maintenance Immunization History  Administered Date(s) Administered  . Influenza,inj,Quad PF,6+ Mos 04/11/2014  . Influenza-Unspecified 06/09/2017  . Zoster 05/16/2014   Health Maintenance Due  Topic Date Due  . DEXA SCAN  04/16/2017  . PNA vac Low Risk Adult (1 of 2 - PCV13) 04/16/2017  . INFLUENZA VACCINE  02/22/2018    Patient Care Team: Lavada Mesi as PCP - General (Family Medicine)  Indicate any recent Medical Services you may have received from other than [facility] providers in the past year (date may be approximate).     Assessment:   This is a routine wellness examination for Janice Dudley.Physical assessment deferred to PCP.   Hearing/Vision screen No exam data present  Dietary issues and exercise activities discussed:   Diet  Breakfast: Lunch:  Dinner:       Goals   None    Depression Screen PHQ 2/9 Scores 06/22/2017  PHQ - 2 Score 0    Fall Risk No flowsheet data found.  Is the patient's home free of loose throw rugs in walkways, pet beds, electrical cords, etc?   {Blank single:19197::"yes","no"}      Grab bars in the bathroom? {Blank single:19197::"yes","no"}      Handrails on the stairs?   {Blank single:19197::"yes","no"}      Adequate lighting?   {Blank single:19197::"yes","no"}   Cognitive Function:        Screening Tests Health Maintenance  Topic Date Due  . DEXA SCAN  04/16/2017  . PNA vac Low Risk Adult (1 of 2 - PCV13) 04/16/2017  . INFLUENZA VACCINE  02/22/2018  . MAMMOGRAM  09/21/2019  . TETANUS/TDAP  08/26/2023  . COLONOSCOPY  05/23/2028  . Hepatitis C Screening  Completed      Plan:   ***  I have personally reviewed and noted the following in the  patient's chart:   .  Medical and social history . Use of alcohol, tobacco or illicit drugs  . Current medications and supplements . Functional ability and status . Nutritional status . Physical activity . Advanced directives . List of other physicians . Hospitalizations, surgeries, and ER visits in previous 12 months . Vitals . Screenings to include cognitive, depression, and falls . Referrals and appointments  In addition, I have reviewed and discussed with patient certain preventive protocols, quality metrics, and best practice recommendations. A written personalized care plan for preventive services as well as general preventive health recommendations were provided to patient.     Joanne Chars, LPN   [id number]/11/4625

## 2018-06-05 ENCOUNTER — Ambulatory Visit: Payer: Medicare Other

## 2018-06-27 NOTE — Progress Notes (Deleted)
Subjective:   Janice Dudley is a 67 y.o. female who presents for an Initial Medicare Annual Wellness Visit.  Review of Systems    No ROS.  Medicare Wellness Visit. Additional risk factors are reflected in the social history.     Sleep patterns: Home Safety/Smoke Alarms: Feels safe in home. Smoke alarms in place.  Living environment; Seat Belt Safety/Bike Helmet: Wears seat belt.   Female:   Pap-  Aged out unless necessary    Mammo-  utd   Dexa scan-        CCS- utd     Objective:    There were no vitals filed for this visit. There is no height or weight on file to calculate BMI.  Advanced Directives 03/12/2014  Does Patient Have a Medical Advance Directive? No    Current Medications (verified) Outpatient Encounter Medications as of 07/02/2018  Medication Sig  . AMBULATORY NON FORMULARY MEDICATION Need for prevnar 13 one injection IM for pneumonia prevention (Patient not taking: Reported on 11/29/2017)  . aspirin 81 MG tablet Take 81 mg by mouth daily.  Marland Kitchen atorvastatin (LIPITOR) 40 MG tablet TAKE 1 TABLET(40 MG) BY MOUTH DAILY  . b complex vitamins tablet Take 1 tablet by mouth daily.  . calcium carbonate (TUMS SMOOTHIES) 750 MG chewable tablet Chew 1 tablet by mouth daily.  . cholecalciferol (VITAMIN D) 1000 UNITS tablet Take 1,000 Units by mouth daily.  Marland Kitchen KRILL OIL PO Take by mouth.  . Magnesium 250 MG TABS Take by mouth daily.   Marland Kitchen MELATONIN PO Take 10 mg by mouth at bedtime.   . traZODone (DESYREL) 50 MG tablet Take 1-2 tablets as needed 1 hour before bedtime.   No facility-administered encounter medications on file as of 07/02/2018.     Allergies (verified) Peach [prunus persica] and Shellfish allergy   History: Past Medical History:  Diagnosis Date  . Cataract    no surgery yet  . Hyperlipidemia    Past Surgical History:  Procedure Laterality Date  . BREAST LUMPECTOMY  1999   left breast/benign  . CESAREAN SECTION  1982/1983   2 times  . COLONOSCOPY   2008  . LAPAROSCOPY     abd/tubal ligation  . TUBAL LIGATION  1988   Family History  Problem Relation Age of Onset  . Stroke Mother 41  . Heart attack Mother 38  . Cancer Mother 89       Uterine  . Heart attack Father 60  . Cancer Father 11       Lung  . Stroke Father 2  . Cancer Sister 88       Breast and Uterine   . Hyperlipidemia Sister   . Thyroid nodules Brother   . Hyperlipidemia Brother   . Thyroid cancer Brother   . Lung cancer Brother   . Prostate cancer Brother   . Hyperlipidemia Sister   . Hyperlipidemia Brother   . Hyperlipidemia Brother   . Colon cancer Neg Hx   . Esophageal cancer Neg Hx   . Rectal cancer Neg Hx    Social History   Socioeconomic History  . Marital status: Married    Spouse name: Not on file  . Number of children: Not on file  . Years of education: Not on file  . Highest education level: Not on file  Occupational History  . Not on file  Social Needs  . Financial resource strain: Not on file  . Food insecurity:    Worry:  Not on file    Inability: Not on file  . Transportation needs:    Medical: Not on file    Non-medical: Not on file  Tobacco Use  . Smoking status: Former Smoker    Packs/day: 0.25    Years: 20.00    Pack years: 5.00    Types: Cigarettes    Last attempt to quit: 05/09/1989    Years since quitting: 29.1  . Smokeless tobacco: Never Used  Substance and Sexual Activity  . Alcohol use: Yes    Alcohol/week: 7.0 standard drinks    Types: 7 Glasses of wine per week    Comment: wine   . Drug use: No  . Sexual activity: Yes  Lifestyle  . Physical activity:    Days per week: Not on file    Minutes per session: Not on file  . Stress: Not on file  Relationships  . Social connections:    Talks on phone: Not on file    Gets together: Not on file    Attends religious service: Not on file    Active member of club or organization: Not on file    Attends meetings of clubs or organizations: Not on file     Relationship status: Not on file  Other Topics Concern  . Not on file  Social History Narrative  . Not on file    Tobacco Counseling Counseling given: Not Answered   Clinical Intake:                        Activities of Daily Living No flowsheet data found.   Immunizations and Health Maintenance Immunization History  Administered Date(s) Administered  . Influenza,inj,Quad PF,6+ Mos 04/11/2014  . Influenza-Unspecified 06/09/2017  . Zoster 05/16/2014   Health Maintenance Due  Topic Date Due  . DEXA SCAN  04/16/2017  . PNA vac Low Risk Adult (1 of 2 - PCV13) 04/16/2017  . INFLUENZA VACCINE  02/22/2018    Patient Care Team: Lavada Mesi as PCP - General (Family Medicine)  Indicate any recent Medical Services you may have received from other than [facility] providers in the past year (date may be approximate).     Assessment:   This is a routine wellness examination for Kemya.Physical assessment deferred to PCP.   Hearing/Vision screen No exam data present  Dietary issues and exercise activities discussed:   Diet  Breakfast: Lunch:  Dinner:       Goals   None    Depression Screen PHQ 2/9 Scores 06/22/2017  PHQ - 2 Score 0    Fall Risk No flowsheet data found.  Is the patient's home free of loose throw rugs in walkways, pet beds, electrical cords, etc?   {Blank single:19197::"yes","no"}      Grab bars in the bathroom? {Blank single:19197::"yes","no"}      Handrails on the stairs?   {Blank single:19197::"yes","no"}      Adequate lighting?   {Blank single:19197::"yes","no"}   Cognitive Function:        Screening Tests Health Maintenance  Topic Date Due  . DEXA SCAN  04/16/2017  . PNA vac Low Risk Adult (1 of 2 - PCV13) 04/16/2017  . INFLUENZA VACCINE  02/22/2018  . MAMMOGRAM  09/21/2019  . TETANUS/TDAP  08/26/2023  . COLONOSCOPY  05/23/2028  . Hepatitis C Screening  Completed       Plan:   ***  I have personally  reviewed and noted the following in the patient's  chart:   . Medical and social history . Use of alcohol, tobacco or illicit drugs  . Current medications and supplements . Functional ability and status . Nutritional status . Physical activity . Advanced directives . List of other physicians . Hospitalizations, surgeries, and ER visits in previous 12 months . Vitals . Screenings to include cognitive, depression, and falls . Referrals and appointments  In addition, I have reviewed and discussed with patient certain preventive protocols, quality metrics, and best practice recommendations. A written personalized care plan for preventive services as well as general preventive health recommendations were provided to patient.     Joanne Chars, LPN   08/31/3783

## 2018-07-02 ENCOUNTER — Ambulatory Visit: Payer: Medicare Other

## 2018-10-22 ENCOUNTER — Encounter: Payer: Self-pay | Admitting: Physician Assistant

## 2018-10-23 ENCOUNTER — Ambulatory Visit (INDEPENDENT_AMBULATORY_CARE_PROVIDER_SITE_OTHER): Payer: Medicare Other | Admitting: Physician Assistant

## 2018-10-23 ENCOUNTER — Other Ambulatory Visit: Payer: Self-pay

## 2018-10-23 ENCOUNTER — Encounter: Payer: Self-pay | Admitting: Physician Assistant

## 2018-10-23 VITALS — Temp 98.1°F | Ht 62.0 in | Wt 180.0 lb

## 2018-10-23 DIAGNOSIS — T7840XA Allergy, unspecified, initial encounter: Secondary | ICD-10-CM

## 2018-10-23 DIAGNOSIS — B349 Viral infection, unspecified: Secondary | ICD-10-CM | POA: Diagnosis not present

## 2018-10-23 NOTE — Patient Instructions (Signed)
Tylenol cold sinus severe.

## 2018-10-23 NOTE — Telephone Encounter (Signed)
Patient scheduled.

## 2018-10-23 NOTE — Progress Notes (Signed)
Patient ID: Janice Dudley, female   DOB: [birth year], 68 y.o.   MRN: [id number] .Marland KitchenVirtual Visit via Telephone Note  I connected with Janice Dudley on 10/23/18 at  8:30 AM EDT by telephone and verified that I am speaking with the correct person using two identifiers.   I discussed the limitations, risks, security and privacy concerns of performing an evaluation and management service by telephone and the availability of in person appointments. I also discussed with the patient that there may be a patient responsible charge related to this service. The patient expressed understanding and agreed to proceed.   History of Present Illness: Pt is a 68 yo female with no significant pmh calls in to discuss low grade temperature, headache, nasal congestion, fatigue. She is concerned due to [place] pandemic and her husbands heart condition and her former smoking status. She has been taking her temperature every day. She has noticed her energy level decreasing. She checked her temperature yesterday was 99.2. they have been at home for the last 2 weeks. She rarely goes to the grocery store and they are not watching their granddaughter. She has some mild nausea without diarrhea. Her daughter told her to NOT take ibuprofen and that usually helps her symptoms. She denies any recent sick contacts. No travel. No SOB or cough.   .. Active Ambulatory Problems    Diagnosis Date Noted  . Hyperlipidemia   . Fibrocystic breast 03/25/2014  . Hypertriglyceridemia 04/15/2014  . Obesity 04/15/2014  . Abnormal weight gain 05/16/2014  . Atypical pigmented skin lesion 03/02/2016  . Post-menopausal 03/02/2016  . No energy 06/22/2017  . Trouble in sleeping 06/22/2017  . Obesity (BMI 30.0-34.9) 06/22/2017  . Strain of abdominal muscle 09/28/2017  . H/O osteopenia 11/29/2017   Resolved Ambulatory Problems    Diagnosis Date Noted  . Sprain of ankle 05/16/2014   Past Medical History:  Diagnosis Date  . Cataract     Reviewed med, allergy, problem list.    Observations/Objective: No acute distress.   .. Today's Vitals   10/23/18 0816  Temp: 98.1 F (36.7 C)  TempSrc: Oral  Weight: 180 lb (81.6 kg)  Height: 5\' 2"  (1.575 m)   Body mass index is 32.92 kg/m.   Assessment and Plan: Marland KitchenMarland KitchenDiagnoses and all orders for this visit:  Viral syndrome  Allergic state, initial encounter   Reassured patient that she is low risk covid. I do think she likely has viral vs allergy syndrome. Discussed ibuprofen and no true data to show worsening of covid;however for now use tylenol based products. Encouraged to get tylenol sinus cold severe and flonase to start. If going to be outside use zyrtec or claritin. Certainly can keep monitoring temperature. Discussed cough and SOB as signs of covid and treatment is 2 weeks strict quarantine. If worsening she would go to ED or call office. Keep me up to date with symptoms.   Follow Up Instructions:    I discussed the assessment and treatment plan with the patient. The patient was provided an opportunity to ask questions and all were answered. The patient agreed with the plan and demonstrated an understanding of the instructions.   The patient was advised to call back or seek an in-person evaluation if the symptoms worsen or if the condition fails to improve as anticipated.  I provided 15 minutes of non-face-to-face time during this encounter.   Iran Planas, PA-C

## 2018-10-23 NOTE — Progress Notes (Signed)
Had slight fever yesterday, nausea, cough, H/A, congestion.Same sx's today but no fever.

## 2018-12-18 ENCOUNTER — Other Ambulatory Visit: Payer: Self-pay | Admitting: Obstetrics and Gynecology

## 2018-12-18 DIAGNOSIS — R928 Other abnormal and inconclusive findings on diagnostic imaging of breast: Secondary | ICD-10-CM

## 2018-12-22 ENCOUNTER — Other Ambulatory Visit: Payer: Self-pay | Admitting: Physician Assistant

## 2018-12-22 DIAGNOSIS — E781 Pure hyperglyceridemia: Secondary | ICD-10-CM

## 2018-12-22 DIAGNOSIS — E782 Mixed hyperlipidemia: Secondary | ICD-10-CM

## 2018-12-27 ENCOUNTER — Other Ambulatory Visit: Payer: Self-pay | Admitting: Obstetrics and Gynecology

## 2018-12-27 ENCOUNTER — Ambulatory Visit
Admission: RE | Admit: 2018-12-27 | Discharge: 2018-12-27 | Disposition: A | Discharge status: Home / Self Care | Payer: Medicare Other | Source: Ambulatory Visit | Attending: Obstetrics and Gynecology | Admitting: Obstetrics and Gynecology

## 2018-12-27 ENCOUNTER — Other Ambulatory Visit: Payer: Self-pay

## 2018-12-27 DIAGNOSIS — R928 Other abnormal and inconclusive findings on diagnostic imaging of breast: Secondary | ICD-10-CM

## 2018-12-27 DIAGNOSIS — N6320 Unspecified lump in the left breast, unspecified quadrant: Secondary | ICD-10-CM

## 2019-01-09 ENCOUNTER — Other Ambulatory Visit: Payer: Self-pay

## 2019-01-09 ENCOUNTER — Encounter: Payer: Self-pay | Admitting: Physician Assistant

## 2019-01-09 ENCOUNTER — Ambulatory Visit (INDEPENDENT_AMBULATORY_CARE_PROVIDER_SITE_OTHER): Payer: Medicare Other | Admitting: Physician Assistant

## 2019-01-09 VITALS — BP 79/52 | HR 74 | Temp 97.6°F | Ht 61.0 in | Wt 187.0 lb

## 2019-01-09 DIAGNOSIS — N6320 Unspecified lump in the left breast, unspecified quadrant: Secondary | ICD-10-CM

## 2019-01-09 DIAGNOSIS — E781 Pure hyperglyceridemia: Secondary | ICD-10-CM

## 2019-01-09 DIAGNOSIS — I959 Hypotension, unspecified: Secondary | ICD-10-CM

## 2019-01-09 DIAGNOSIS — G479 Sleep disorder, unspecified: Secondary | ICD-10-CM | POA: Diagnosis not present

## 2019-01-09 DIAGNOSIS — S81811D Laceration without foreign body, right lower leg, subsequent encounter: Secondary | ICD-10-CM | POA: Diagnosis not present

## 2019-01-09 DIAGNOSIS — E6609 Other obesity due to excess calories: Secondary | ICD-10-CM

## 2019-01-09 DIAGNOSIS — Z131 Encounter for screening for diabetes mellitus: Secondary | ICD-10-CM

## 2019-01-09 DIAGNOSIS — Z23 Encounter for immunization: Secondary | ICD-10-CM | POA: Diagnosis not present

## 2019-01-09 DIAGNOSIS — Z6835 Body mass index (BMI) 35.0-35.9, adult: Secondary | ICD-10-CM

## 2019-01-09 DIAGNOSIS — E782 Mixed hyperlipidemia: Secondary | ICD-10-CM | POA: Diagnosis not present

## 2019-01-09 LAB — COMPLETE METABOLIC PANEL WITH GFR
AG Ratio: 1.7 (calc) (ref 1.0–2.5)
ALT: 15 U/L (ref 6–29)
AST: 19 U/L (ref 10–35)
Albumin: 4.3 g/dL (ref 3.6–5.1)
Alkaline phosphatase (APISO): 72 U/L (ref 37–153)
BUN: 17 mg/dL (ref 7–25)
CO2: 30 mmol/L (ref 20–32)
Calcium: 10 mg/dL (ref 8.6–10.4)
Chloride: 103 mmol/L (ref 98–110)
Creat: 0.85 mg/dL (ref 0.50–0.99)
GFR, Est African American: 83 mL/min/{1.73_m2} (ref >=60)
GFR, Est Non African American: 71 mL/min/{1.73_m2} (ref >=60)
Globulin: 2.5 g/dL (calc) (ref 1.9–3.7)
Glucose, Bld: 91 mg/dL (ref 65–99)
Potassium: 4.4 mmol/L (ref 3.5–5.3)
Sodium: 139 mmol/L (ref 135–146)
Total Bilirubin: 0.6 mg/dL (ref 0.2–1.2)
Total Protein: 6.8 g/dL (ref 6.1–8.1)

## 2019-01-09 LAB — LIPID PANEL W/REFLEX DIRECT LDL
Cholesterol: 144 mg/dL (ref <200)
HDL: 53 mg/dL (ref >=50)
LDL Cholesterol (Calc): 72 mg/dL (calc)
Non-HDL Cholesterol (Calc): 91 mg/dL (calc) (ref <130)
Total CHOL/HDL Ratio: 2.7 (calc) (ref <5.0)
Triglycerides: 105 mg/dL (ref <150)

## 2019-01-09 MED ORDER — TRAZODONE HCL 50 MG PO TABS: ORAL_TABLET | ORAL | 1 refills | Status: DC

## 2019-01-09 MED ORDER — ATORVASTATIN CALCIUM 40 MG PO TABS: ORAL_TABLET | ORAL | 3 refills | Status: DC

## 2019-01-09 MED ORDER — TOPIRAMATE 50 MG PO TABS: 50.0000 mg | ORAL_TABLET | Freq: Two times a day (BID) | ORAL | 2 refills | Status: DC

## 2019-01-09 NOTE — Patient Instructions (Addendum)
Start topamax at bedtime one tablet for 1 week then increase to twice a day.    Hypotension As your heart beats, it forces blood through your body. This force is called blood pressure. If you have hypotension, you have low blood pressure. When your blood pressure is too low, you may not get enough blood to your brain or other parts of your body. This may cause you to feel weak, light-headed, have a fast heartbeat, or even pass out (faint). Low blood pressure may be harmless, or it may cause serious problems. What are the causes?  Blood loss.  Not enough water in the body (dehydration).  Heart problems.  Hormone problems.  Pregnancy.  A very bad infection.  Not having enough of certain nutrients.  Very bad allergic reactions.  Certain medicines. What increases the risk?  Age. The risk increases as you get older.  Conditions that affect the heart or the brain and spinal cord (central nervous system).  Taking certain medicines.  Being pregnant. What are the signs or symptoms?  Feeling: ? Weak. ? Light-headed. ? Dizzy. ? Tired (fatigued).  Blurred vision.  Fast heartbeat.  Passing out, in very bad cases. How is this treated?  Changing your diet. This may involve eating more salt (sodium) or drinking more water.  Taking medicines to raise your blood pressure.  Changing how much you take (the dosage) of some of your medicines.  Wearing compression stockings. These stockings help to prevent blood clots and reduce swelling in your legs. In some cases, you may need to go to the hospital for:  Fluid replacement. This means you will receive fluids through an IV tube.  Blood replacement. This means you will receive donated blood through an IV tube (transfusion).  Treating an infection or heart problems, if this applies.  Monitoring. You may need to be monitored while medicines that you are taking wear off. Follow these instructions at home: Eating and drinking    Drink enough fluids to keep your pee (urine) pale yellow.  Eat a healthy diet. Follow instructions from your doctor about what you can eat or drink. A healthy diet includes: ? Fresh fruits and vegetables. ? Whole grains. ? Low-fat (lean) meats. ? Low-fat dairy products.  Eat extra salt only as told. Do not add extra salt to your diet unless your doctor tells you to.  Eat small meals often.  Avoid standing up quickly after you eat. Medicines  Take over-the-counter and prescription medicines only as told by your doctor. ? Follow instructions from your doctor about changing how much you take of your medicines, if this applies. ? Do not stop or change any of your medicines on your own. General instructions   Wear compression stockings as told by your doctor.  Get up slowly from lying down or sitting.  Avoid hot showers and a lot of heat as told by your doctor.  Return to your normal activities as told by your doctor. Ask what activities are safe for you.  Do not use any products that contain nicotine or tobacco, such as cigarettes, e-cigarettes, and chewing tobacco. If you need help quitting, ask your doctor.  Keep all follow-up visits as told by your doctor. This is important. Contact a doctor if:  You throw up (vomit).  You have watery poop (diarrhea).  You have a fever for more than 2-3 days.  You feel more thirsty than normal.  You feel weak and tired. Get help right away if:  You have  chest pain.  You have a fast or uneven heartbeat.  You lose feeling (have numbness) in any part of your body.  You cannot move your arms or your legs.  You have trouble talking.  You get sweaty or feel light-headed.  You pass out.  You have trouble breathing.  You have trouble staying awake.  You feel mixed up (confused). Summary  Hypotension is also called low blood pressure. It is when the force of blood pumping through your arteries is too weak.  Hypotension may  be harmless, or it may cause serious problems.  Treatment may include changing your diet and medicines, and wearing compression stockings.  In very bad cases, you may need to go to the hospital. This information is not intended to replace advice given to you by your health care provider. Make sure you discuss any questions you have with your health care provider. Document Released: 10/05/2009 Document Revised: 01/04/2018 Document Reviewed: 01/04/2018 Elsevier Interactive Patient Education  [employer].

## 2019-01-09 NOTE — Progress Notes (Signed)
Subjective:    Patient ID: Janice Dudley, female    DOB: [birth year], 68 y.o.   MRN: [id number]  HPI  Pt is a 68 yo female who presents to the clinic for wound check and stiches removal. On 6/3 she fell off a boat and cut her right lower leg. She went to ED and 3 stiches were placed. She was not started on abx. She denies any problems or concerns. No fever, chills, redness or swelling.   Pt is very concerned about weight. Insurance would not pay for saxenda. She feels like she is doing everything she can but with no results.   Pt denies any dizziness, clamminess, fatigue associated with BP. She has not ate or drank today.    Mentions being followed closely for imaging of left breast mass. 6 month follow up.   .. Active Ambulatory Problems    Diagnosis Date Noted  . Hyperlipidemia   . Fibrocystic breast 03/25/2014  . Hypertriglyceridemia 04/15/2014  . Obesity 04/15/2014  . Abnormal weight gain 05/16/2014  . Atypical pigmented skin lesion 03/02/2016  . Post-menopausal 03/02/2016  . No energy 06/22/2017  . Trouble in sleeping 06/22/2017  . Strain of abdominal muscle 09/28/2017  . H/O osteopenia 11/29/2017  . Left breast mass 01/14/2019  . Hypotension 01/14/2019  . Laceration of right lower extremity 01/14/2019   Resolved Ambulatory Problems    Diagnosis Date Noted  . Sprain of ankle 05/16/2014  . Obesity (BMI 30.0-34.9) 06/22/2017   Past Medical History:  Diagnosis Date  . Cataract         Review of Systems  All other systems reviewed and are negative.      Objective:   Physical Exam Vitals signs reviewed.  Constitutional:      Appearance: Normal appearance.  Cardiovascular:     Rate and Rhythm: Normal rate and regular rhythm.     Pulses: Normal pulses.     Heart sounds: Normal heart sounds.  Pulmonary:     Effort: Pulmonary effort is normal.     Breath sounds: Normal breath sounds.  Skin:    Comments: Right lower anterior leg laceration with 3  simple interrupted stiches. Scant redness around scabbed over laceration. No tenderness or warmth to touch. No active discharge.   Neurological:     General: No focal deficit present.     Mental Status: She is alert and oriented to person, place, and time.  Psychiatric:        Mood and Affect: Mood normal.           Assessment & Plan:  Marland KitchenMarland KitchenTylar was seen today for hyperlipidemia.  Diagnoses and all orders for this visit:  Laceration of right lower extremity, subsequent encounter  Mixed hyperlipidemia -     atorvastatin (LIPITOR) 40 MG tablet; TAKE 1 TABLET(40 MG) BY MOUTH DAILY -     Lipid Panel w/reflex Direct LDL  Hypertriglyceridemia -     atorvastatin (LIPITOR) 40 MG tablet; TAKE 1 TABLET(40 MG) BY MOUTH DAILY -     COMPLETE METABOLIC PANEL WITH GFR  Trouble in sleeping -     traZODone (DESYREL) 50 MG tablet; Take 1-2 tablets as needed 1 hour before bedtime.  Need for pneumococcal vaccine -     Pneumococcal polysaccharide vaccine 23-valent greater than or equal to 2yo subcutaneous/IM  Screening for diabetes mellitus -     COMPLETE METABOLIC PANEL WITH GFR  Left breast mass  Hypotension, unspecified hypotension type  Class 2 obesity due to  excess calories without serious comorbidity with body mass index (BMI) of 35.0 to 35.9 in adult -     topiramate (TOPAMAX) 50 MG tablet; Take 1 tablet (50 mg total) by mouth 2 (two) times daily.  needs labs. Ordered today.  Needs pneumonia vaccine and given today.   Discussed hypotension and warning signs. Make sure to stay hydrated.   Removed without complication 3 stiches. Discussed scar care. Discussed if signs of infection started to call office. Scant redness around healing laceration.   Marland Kitchen.Discussed low carb diet with 1500 calories and 80g of protein.  Exercising at least 150 minutes a week.  [employer] could be a Microbiologist.  topamax started. Discussed side effects.  Follow up in3 months.

## 2019-01-10 NOTE — Progress Notes (Signed)
Call pt: cholesterol looks fantastic. Kidney, liver, glucose look GREAT.

## 2019-01-14 DIAGNOSIS — N6320 Unspecified lump in the left breast, unspecified quadrant: Secondary | ICD-10-CM | POA: Insufficient documentation

## 2019-01-14 DIAGNOSIS — S81811A Laceration without foreign body, right lower leg, initial encounter: Secondary | ICD-10-CM | POA: Insufficient documentation

## 2019-01-14 DIAGNOSIS — I959 Hypotension, unspecified: Secondary | ICD-10-CM | POA: Insufficient documentation

## 2019-01-16 NOTE — Progress Notes (Addendum)
Subjective:   Janice Dudley is a 68 y.o. female who presents for an Initial Medicare Annual Wellness Visit.  Review of Systems    No ROS.  Medicare Wellness Virtual Visit.  Visual/audio telehealth visit, UTA vital signs.   See social history for additional risk factors.     Cardiac Risk Factors include: advanced age (>64men, >76 women);dyslipidemia Sleep patterns: Getting 6 hours of sleep most nights. Wakes up and goes back to sleep. Wakes up feels refreshed and ready for the day.   Home Safety/Smoke Alarms: Feels safe in home. Smoke alarms in place.  Living environment; Lives with husband in a 2 story home. Stepa have hand rails in place. SHower is a walk in shower  With bench in showewr. Seat Belt Safety/Bike Helmet: Wears seat belt.   Female:   Pap-  UTD     Mammo- UTD      Dexa scan-  UTD      CCS- UTD     Objective:    Today's Vitals   01/21/19 1019  BP: 110/68  Pulse: 65  Weight: 181 lb (82.1 kg)  Height: 5\' 1"  (1.549 m)   Body mass index is 34.2 kg/m.  Advanced Directives 01/21/2019 03/12/2014  Does Patient Have a Medical Advance Directive? Yes No  Type of Paramedic of [place];Living will -  Does patient want to make changes to medical advance directive? No - Patient declined -  Copy of [place] in Chart? No - copy requested -    Current Medications (verified) Outpatient Encounter Medications as of 01/21/2019  Medication Sig  . aspirin 81 MG tablet Take 81 mg by mouth daily.  Marland Kitchen atorvastatin (LIPITOR) 40 MG tablet TAKE 1 TABLET(40 MG) BY MOUTH DAILY  . b complex vitamins tablet Take 1 tablet by mouth daily.  . calcium carbonate (TUMS SMOOTHIES) 750 MG chewable tablet Chew 1 tablet by mouth daily.  . cholecalciferol (VITAMIN D) 1000 UNITS tablet Take 1,000 Units by mouth daily.  Marland Kitchen KRILL OIL PO Take by mouth.  . Magnesium 250 MG TABS Take by mouth daily.   Marland Kitchen MELATONIN PO Take 10 mg by mouth at bedtime.    . traZODone (DESYREL) 50 MG tablet Take 1-2 tablets as needed 1 hour before bedtime.  . topiramate (TOPAMAX) 50 MG tablet Take 1 tablet (50 mg total) by mouth 2 (two) times daily.   No facility-administered encounter medications on file as of 01/21/2019.     Allergies (verified) Peach [prunus persica] and Shellfish allergy   History: Past Medical History:  Diagnosis Date  . Cataract    no surgery yet  . Hyperlipidemia    Past Surgical History:  Procedure Laterality Date  . BREAST LUMPECTOMY  1999   left breast/benign  . CESAREAN SECTION  1982/1983   2 times  . COLONOSCOPY  2008  . LAPAROSCOPY     abd/tubal ligation  . TUBAL LIGATION  1988   Family History  Problem Relation Age of Onset  . Stroke Mother 31  . Heart attack Mother 64  . Cancer Mother 65       Uterine  . Breast cancer Mother 22  . Heart attack Father 36  . Cancer Father 62       Lung  . Stroke Father 60  . Cancer Sister 99       Breast and Uterine   . Hyperlipidemia Sister   . Breast cancer Sister 19  . Thyroid nodules Brother   .  Hyperlipidemia Brother   . Thyroid cancer Brother   . Lung cancer Brother   . Prostate cancer Brother   . Hyperlipidemia Sister   . Hyperlipidemia Brother   . Hyperlipidemia Brother   . Colon cancer Neg Hx   . Esophageal cancer Neg Hx   . Rectal cancer Neg Hx    Social History   Socioeconomic History  . Marital status: Married    Spouse name: [name]  . Number of children: 2  . Years of education: 32  . Highest education level: Master's degree (e.g., MA, MS, MEng, MEd, MSW, MBA)  Occupational History  . Occupation: Pharmacist, hospital    Comment: retired  Scientific laboratory technician  . Financial resource strain: Not hard at all  . Food insecurity    Worry: Never true    Inability: Never true  . Transportation needs    Medical: No    Non-medical: No  Tobacco Use  . Smoking status: Former Smoker    Packs/day: 0.25    Years: 20.00    Pack years: 5.00    Types: Cigarettes    Quit  date: 05/09/1989    Years since quitting: 29.7  . Smokeless tobacco: Never Used  Substance and Sexual Activity  . Alcohol use: Yes    Alcohol/week: 7.0 standard drinks    Types: 7 Glasses of wine per week    Comment: wine   . Drug use: No  . Sexual activity: Not Currently  Lifestyle  . Physical activity    Days per week: 0 days    Minutes per session: 0 min  . Stress: Not at all  Relationships  . Social Herbalist on phone: Three times a week    Gets together: Twice a week    Attends religious service: More than 4 times per year    Active member of club or organization: Yes    Attends meetings of clubs or organizations: More than 4 times per year    Relationship status: Married  Other Topics Concern  . Not on file  Social History Narrative   Walks when has time.   Caffeine- coffee in the morning    Tobacco Counseling Counseling given: Not Answered   Clinical Intake:                        Activities of Daily Living In your present state of health, do you have any difficulty performing the following activities: 01/21/2019  Hearing? N  Vision? N  Comment wears readers  Difficulty concentrating or making decisions? N  Walking or climbing stairs? N  Dressing or bathing? N  Doing errands, shopping? N  Preparing Food and eating ? N  Using the Toilet? N  In the past six months, have you accidently leaked urine? N  Do you have problems with loss of bowel control? N  Managing your Medications? N  Managing your Finances? N  Housekeeping or managing your Housekeeping? N  Some recent data might be hidden     Immunizations and Health Maintenance Immunization History  Administered Date(s) Administered  . Influenza,inj,Quad PF,6+ Mos 04/11/2014  . Influenza-Unspecified 06/09/2017  . Pneumococcal Polysaccharide-23 01/09/2019  . Zoster 05/16/2014   There are no preventive care reminders to display for this patient.  Patient Care  Team: Lavada Mesi as PCP - General (Family Medicine)  Indicate any recent Medical Services you may have received from other than [facility] providers in the past year (date  may be approximate).     Assessment:   This is a routine wellness examination for Marixa.Physical assessment deferred to PCP.   Hearing/Vision screen No exam data present  Dietary issues and exercise activities discussed: Current Exercise Habits: The patient does not participate in regular exercise at present Diet Eats a healthy diet vegetables, fish and chicken, salad Breakfast: cereal with fruit and milk. Lunch: salad with fruit Dinner: Meat and vegetables with glass of wine      Goals    . Exercise 150 min/wk Moderate Activity     Getting back into doing silver sneakers once opens back up after COVID.      Depression Screen PHQ 2/9 Scores 01/21/2019 01/09/2019 06/22/2017  PHQ - 2 Score 0 0 0  PHQ- 9 Score 1 1 -    Fall Risk Fall Risk  01/21/2019  Falls in the past year? 1  Number falls in past yr: 0  Injury with Fall? 1  Risk for fall due to : Other (Comment)  Risk for fall due to: Comment fell getting off boat  Follow up Falls prevention discussed    Is the patient's home free of loose throw rugs in walkways, pet beds, electrical cords, etc?   yes      Grab bars in the bathroom? no      Handrails on the stairs?   yes      Adequate lighting?   yes  Cognitive Function:     6CIT Screen 01/21/2019  What Year? 0 points  What month? 0 points  What time? 0 points  Count back from 20 0 points  Months in reverse 0 points  Repeat phrase 0 points  Total Score 0    Screening Tests Health Maintenance  Topic Date Due  . INFLUENZA VACCINE  02/23/2019  . MAMMOGRAM  09/21/2019  . PNA vac Low Risk Adult (2 of 2 - PCV13) 01/09/2020  . COLONOSCOPY  05/23/2028  . TETANUS/TDAP  12/25/2028  . DEXA SCAN  Completed  . Hepatitis C Screening  Completed      Plan:      Ms. Perlstein , Thank you  for taking time to come for your Medicare Wellness Visit. I appreciate your ongoing commitment to your health goals. Please review the following plan we discussed and let me know if I can assist you in the future. Please schedule your next medicare wellness visit with me in 1 yr. Continue doing brain stimulating activities (puzzles, reading, adult coloring books, staying active) to keep memory sharp.     These are the goals we discussed: Goals    . Exercise 150 min/wk Moderate Activity     Getting back into doing silver sneakers once opens back up after COVID.       This is a list of the screening recommended for you and due dates:  Health Maintenance  Topic Date Due  . Flu Shot  02/23/2019  . Mammogram  09/21/2019  . Pneumonia vaccines (2 of 2 - PCV13) 01/09/2020  . Colon Cancer Screening  05/23/2028  . Tetanus Vaccine  12/25/2028  . DEXA scan (bone density measurement)  Completed  .  Hepatitis C: One time screening is recommended by Center for Disease Control  (CDC) for  adults born from [birth year] through 1964.   Completed     I have personally reviewed and noted the following in the patient's chart:   . Medical and social history . Use of alcohol, tobacco or illicit drugs  .  Current medications and supplements . Functional ability and status . Nutritional status . Physical activity . Advanced directives . List of other physicians . Hospitalizations, surgeries, and ER visits in previous 12 months . Vitals . Screenings to include cognitive, depression, and falls . Referrals and appointments  In addition, I have reviewed and discussed with patient certain preventive protocols, quality metrics, and best practice recommendations. A written personalized care plan for preventive services as well as general preventive health recommendations were provided to patient.     Joanne Chars, LPN   01/22/1600    .Marland KitchenMedical screening examination/treatment was performed by qualified  clinical staff member and as supervising physician I was immediately available for consultation/collaboration. I have reviewed documentation and agree with assessment and plan.  Iran Planas, PA-C

## 2019-01-21 ENCOUNTER — Ambulatory Visit (INDEPENDENT_AMBULATORY_CARE_PROVIDER_SITE_OTHER): Payer: Medicare Other | Admitting: *Deleted

## 2019-01-21 VITALS — BP 110/68 | HR 65 | Ht 61.0 in | Wt 181.0 lb

## 2019-01-21 DIAGNOSIS — Z0000 Encounter for general adult medical examination without abnormal findings: Secondary | ICD-10-CM | POA: Diagnosis not present

## 2019-04-04 ENCOUNTER — Other Ambulatory Visit: Payer: Self-pay | Admitting: Physician Assistant

## 2019-04-04 DIAGNOSIS — E6609 Other obesity due to excess calories: Secondary | ICD-10-CM

## 2019-04-04 DIAGNOSIS — Z6835 Body mass index (BMI) 35.0-35.9, adult: Secondary | ICD-10-CM

## 2019-06-29 ENCOUNTER — Encounter: Payer: Self-pay | Admitting: Physician Assistant

## 2019-07-02 ENCOUNTER — Other Ambulatory Visit: Payer: Self-pay | Admitting: Physician Assistant

## 2019-07-02 ENCOUNTER — Telehealth (INDEPENDENT_AMBULATORY_CARE_PROVIDER_SITE_OTHER): Payer: Medicare Other | Admitting: Physician Assistant

## 2019-07-02 ENCOUNTER — Encounter: Payer: Self-pay | Admitting: Physician Assistant

## 2019-07-02 VITALS — BP 111/58 | HR 84 | Temp 97.7°F | Ht 61.0 in | Wt 181.0 lb

## 2019-07-02 DIAGNOSIS — J329 Chronic sinusitis, unspecified: Secondary | ICD-10-CM | POA: Diagnosis not present

## 2019-07-02 DIAGNOSIS — Z6835 Body mass index (BMI) 35.0-35.9, adult: Secondary | ICD-10-CM

## 2019-07-02 DIAGNOSIS — E6609 Other obesity due to excess calories: Secondary | ICD-10-CM

## 2019-07-02 DIAGNOSIS — J40 Bronchitis, not specified as acute or chronic: Secondary | ICD-10-CM

## 2019-07-02 MED ORDER — ALBUTEROL SULFATE HFA 108 (90 BASE) MCG/ACT IN AERS: 2.0000 | INHALATION_SPRAY | Freq: Four times a day (QID) | RESPIRATORY_TRACT | 0 refills | Status: DC | PRN

## 2019-07-02 MED ORDER — AZITHROMYCIN 250 MG PO TABS: ORAL_TABLET | ORAL | 0 refills | Status: DC

## 2019-07-02 MED ORDER — HYDROCOD POLST-CPM POLST ER 10-8 MG/5ML PO SUER: 5.0000 mL | Freq: Two times a day (BID) | ORAL | 0 refills | Status: DC | PRN

## 2019-07-02 MED ORDER — TOPIRAMATE 50 MG PO TABS: 50.0000 mg | ORAL_TABLET | Freq: Two times a day (BID) | ORAL | 3 refills | Status: DC

## 2019-07-02 MED ORDER — PREDNISONE 50 MG PO TABS: ORAL_TABLET | ORAL | 0 refills | Status: DC

## 2019-07-02 NOTE — Progress Notes (Deleted)
Sore throat last Wednesday night Then following symptoms: Fever (highest 100) Congestion Chills Headache Cough (productive) Fatigue  No body aches/ no change in taste smell / No known exposure to Covid

## 2019-07-02 NOTE — Progress Notes (Signed)
Patient ID: Janice Dudley, female   DOB: 1951-02-17, 68 y.o.   MRN: [id number] .Marland KitchenVirtual Visit via Video Note  I connected with Makynley Raynolds on 07/02/19 at 10:50 AM EST by a video enabled telemedicine application and verified that I am speaking with the correct person using two identifiers.  Location: Patient: home Provider: clinic   I discussed the limitations of evaluation and management by telemedicine and the availability of in person appointments. The patient expressed understanding and agreed to proceed.  History of Present Illness: Pt is a 68 yo female who calls into the clinic with ST, fever, congestion, chills, headache and cough for last week. No other sick contacts in home. No direct covid contact. Retired and only goes to [employer] and church with mask. She feels like moving into her chest. No SOB or wheezing. No loss of smell or taste. No GI symptoms. She has a lot of sinus pressure. She is getting into more coughing fits that makes it hard to sleep. She is taking cough and cold OTC preparations. No hx of lung disease.    .. Active Ambulatory Problems    Diagnosis Date Noted  . Hyperlipidemia   . Fibrocystic breast 03/25/2014  . Hypertriglyceridemia 04/15/2014  . Obesity 04/15/2014  . Abnormal weight gain 05/16/2014  . Atypical pigmented skin lesion 03/02/2016  . Post-menopausal 03/02/2016  . No energy 06/22/2017  . Trouble in sleeping 06/22/2017  . Strain of abdominal muscle 09/28/2017  . H/O osteopenia 11/29/2017  . Left breast mass 01/14/2019  . Hypotension 01/14/2019  . Laceration of right lower extremity 01/14/2019   Resolved Ambulatory Problems    Diagnosis Date Noted  . Sprain of ankle 05/16/2014  . Obesity (BMI 30.0-34.9) 06/22/2017   Past Medical History:  Diagnosis Date  . Cataract    Reviewed med, allergy, problem list.   Observations/Objective: No acute distress. Productive cough on exam. No wheezing or SOB.  Flushed  cheeks.  Normal mood.  No labored breathing.   .. Today's Vitals   07/02/19 1007  BP: (!) 111/58  Pulse: 84  Temp: 97.7 F (36.5 C)  TempSrc: Oral  Weight: 181 lb (82.1 kg)  Height: 5\' 1"  (1.549 m)   Body mass index is 34.2 kg/m.  Assessment and Plan: Marland KitchenMarland KitchenCaitriona was seen today for nasal congestion.  Diagnoses and all orders for this visit:  Sinobronchitis -     predniSONE (DELTASONE) 50 MG tablet; Take one tablet for 5 days. -     albuterol (VENTOLIN HFA) 108 (90 Base) MCG/ACT inhaler; Inhale 2 puffs into the lungs every 6 (six) hours as needed for wheezing or shortness of breath. -     azithromycin (ZITHROMAX Z-PAK) 250 MG tablet; Take 2 tablets (500 mg) on  Day 1,  followed by 1 tablet (250 mg) once daily on Days 2 through 5. -     chlorpheniramine-HYDROcodone (TUSSIONEX) 10-8 MG/5ML SUER; Take 5 mLs by mouth every 12 (twelve) hours as needed.  Class 2 obesity due to excess calories without serious comorbidity with body mass index (BMI) of 35.0 to 35.9 in adult -     topiramate (TOPAMAX) 50 MG tablet; Take 1 tablet (50 mg total) by mouth 2 (two) times daily.   Cannot be [id number] percent certain this is not covid. Pt is stable and at 7 days of symptoms.  Pt agrees to self isolate the 14 days from symptoms which would be 7 more days. Symptoms consistent with bronchitis/sinus infection will treat with prednisone,  zpack, albuterol and cough syrup. Rest and hydrate if symptoms worsen or breathing becomes labored go to ED.   Continues to lose weight. Doing well on topamax. Refilled today.    Follow Up Instructions:    I discussed the assessment and treatment plan with the patient. The patient was provided an opportunity to ask questions and all were answered. The patient agreed with the plan and demonstrated an understanding of the instructions.   The patient was advised to call back or seek an in-person evaluation if the symptoms worsen or if the condition fails to improve as  anticipated.      Iran Planas, PA-C

## 2019-07-09 ENCOUNTER — Other Ambulatory Visit: Payer: Self-pay | Admitting: Neurology

## 2019-07-09 DIAGNOSIS — G479 Sleep disorder, unspecified: Secondary | ICD-10-CM

## 2019-07-09 MED ORDER — TRAZODONE HCL 50 MG PO TABS: ORAL_TABLET | ORAL | 0 refills | Status: DC

## 2019-07-15 ENCOUNTER — Ambulatory Visit
Admission: RE | Admit: 2019-07-15 | Discharge: 2019-07-15 | Disposition: A | Discharge status: Home / Self Care | Payer: Medicare Other | Source: Ambulatory Visit | Attending: Obstetrics and Gynecology | Admitting: Obstetrics and Gynecology

## 2019-07-15 ENCOUNTER — Other Ambulatory Visit: Payer: Self-pay | Admitting: Obstetrics and Gynecology

## 2019-07-15 ENCOUNTER — Other Ambulatory Visit: Payer: Self-pay

## 2019-07-15 DIAGNOSIS — N6320 Unspecified lump in the left breast, unspecified quadrant: Secondary | ICD-10-CM

## 2019-12-30 ENCOUNTER — Other Ambulatory Visit: Payer: Self-pay | Admitting: Physician Assistant

## 2019-12-30 DIAGNOSIS — E782 Mixed hyperlipidemia: Secondary | ICD-10-CM

## 2019-12-30 DIAGNOSIS — E781 Pure hyperglyceridemia: Secondary | ICD-10-CM

## 2020-01-20 ENCOUNTER — Other Ambulatory Visit: Payer: Self-pay

## 2020-01-20 ENCOUNTER — Ambulatory Visit
Admission: RE | Admit: 2020-01-20 | Discharge: 2020-01-20 | Disposition: A | Discharge status: Home / Self Care | Payer: Medicare Other | Source: Ambulatory Visit | Attending: Obstetrics and Gynecology | Admitting: Obstetrics and Gynecology

## 2020-01-20 DIAGNOSIS — N6320 Unspecified lump in the left breast, unspecified quadrant: Secondary | ICD-10-CM

## 2020-03-25 ENCOUNTER — Encounter: Payer: Self-pay | Admitting: Physician Assistant

## 2020-03-26 ENCOUNTER — Telehealth (INDEPENDENT_AMBULATORY_CARE_PROVIDER_SITE_OTHER): Payer: Medicare PPO | Admitting: Physician Assistant

## 2020-03-26 ENCOUNTER — Encounter: Payer: Self-pay | Admitting: Physician Assistant

## 2020-03-26 VITALS — Temp 98.5°F | Ht 61.0 in | Wt 185.0 lb

## 2020-03-26 DIAGNOSIS — R300 Dysuria: Secondary | ICD-10-CM | POA: Diagnosis not present

## 2020-03-26 DIAGNOSIS — R3915 Urgency of urination: Secondary | ICD-10-CM | POA: Diagnosis not present

## 2020-03-26 DIAGNOSIS — R8290 Unspecified abnormal findings in urine: Secondary | ICD-10-CM | POA: Diagnosis not present

## 2020-03-26 MED ORDER — CEPHALEXIN 500 MG PO CAPS: 500.0000 mg | ORAL_CAPSULE | Freq: Two times a day (BID) | ORAL | 0 refills | Status: DC

## 2020-03-26 NOTE — Telephone Encounter (Signed)
Patient was scheduled and has a virtual visit for 10:00

## 2020-03-26 NOTE — Telephone Encounter (Signed)
Can we call and double book me anytime to speak with her.

## 2020-03-26 NOTE — Progress Notes (Signed)
..  Virtual Visit via Telephone Note  I connected with Janice Dudley on 03/26/20 at 10:10 AM EDT by telephone and verified that I am speaking with the correct person using two identifiers.  Location: Patient: home Provider: clinic   I discussed the limitations, risks, security and privacy concerns of performing an evaluation and management service by telephone and the availability of in person appointments. I also discussed with the patient that there may be a patient responsible charge related to this service. The patient expressed understanding and agreed to proceed.   History of Present Illness: Pt is a 69 yo female with one day of urine odor, urgency, dysuria she is leaving to go out of town tomorrow. No fever, chills, headache, flank pain, abdominal pain, nausea, discharge. She is taking AZO which helps some. Hx of UTI in past.   .. Active Ambulatory Problems    Diagnosis Date Noted  . Hyperlipidemia   . Fibrocystic breast 03/25/2014  . Hypertriglyceridemia 04/15/2014  . Obesity 04/15/2014  . Abnormal weight gain 05/16/2014  . Atypical pigmented skin lesion 03/02/2016  . Post-menopausal 03/02/2016  . No energy 06/22/2017  . Trouble in sleeping 06/22/2017  . Strain of abdominal muscle 09/28/2017  . H/O osteopenia 11/29/2017  . Left breast mass 01/14/2019  . Hypotension 01/14/2019  . Laceration of right lower extremity 01/14/2019   Resolved Ambulatory Problems    Diagnosis Date Noted  . Sprain of ankle 05/16/2014  . Obesity (BMI 30.0-34.9) 06/22/2017   Past Medical History:  Diagnosis Date  . Cataract    Reviewed med, allergy, problem list.     Observations/Objective: No acute distress  .Marland Kitchen Today's Vitals   03/26/20 0950  Temp: 98.5 F (36.9 C)  TempSrc: Oral  Weight: 185 lb (83.9 kg)  Height: 5\' 1"  (1.549 m)   Body mass index is 34.96 kg/m.   Assessment and Plan: Marland KitchenMarland KitchenCrystalmarie was seen today for urinary tract infection.  Diagnoses and all orders  for this visit:  Dysuria -     cephALEXin (KEFLEX) 500 MG capsule; Take 1 capsule (500 mg total) by mouth 2 (two) times daily.  Urgency of urination -     cephALEXin (KEFLEX) 500 MG capsule; Take 1 capsule (500 mg total) by mouth 2 (two) times daily.  Foul smelling urine -     cephALEXin (KEFLEX) 500 MG capsule; Take 1 capsule (500 mg total) by mouth 2 (two) times daily.   Based on hx and symptoms treated for uncomplicated UTI with keflex. Continue Azo and hydration. Follow up as needed if symptoms worsen or not improving.    Follow Up Instructions:    I discussed the assessment and treatment plan with the patient. The patient was provided an opportunity to ask questions and all were answered. The patient agreed with the plan and demonstrated an understanding of the instructions.   The patient was advised to call back or seek an in-person evaluation if the symptoms worsen or if the condition fails to improve as anticipated.  I provided 5 minutes of non-face-to-face time during this encounter.   Iran Planas, PA-C

## 2020-03-26 NOTE — Progress Notes (Signed)
Frequent urge to go and discomfort at the end of the stream.  It does not feel like cystitis.   This started late yesterday. Using AZO and cranberry juice and water

## 2020-03-29 ENCOUNTER — Other Ambulatory Visit: Payer: Self-pay | Admitting: Physician Assistant

## 2020-03-29 DIAGNOSIS — G479 Sleep disorder, unspecified: Secondary | ICD-10-CM

## 2020-05-04 ENCOUNTER — Encounter: Payer: Self-pay | Admitting: Physician Assistant

## 2020-05-04 DIAGNOSIS — E781 Pure hyperglyceridemia: Secondary | ICD-10-CM

## 2020-05-04 DIAGNOSIS — E782 Mixed hyperlipidemia: Secondary | ICD-10-CM

## 2020-05-04 MED ORDER — ATORVASTATIN CALCIUM 40 MG PO TABS: 40.0000 mg | ORAL_TABLET | Freq: Every day | ORAL | 0 refills | Status: DC

## 2020-05-26 ENCOUNTER — Ambulatory Visit (INDEPENDENT_AMBULATORY_CARE_PROVIDER_SITE_OTHER): Payer: Medicare PPO | Admitting: Physician Assistant

## 2020-05-26 ENCOUNTER — Ambulatory Visit (INDEPENDENT_AMBULATORY_CARE_PROVIDER_SITE_OTHER): Payer: Medicare PPO

## 2020-05-26 ENCOUNTER — Other Ambulatory Visit: Payer: Self-pay

## 2020-05-26 VITALS — BP 106/58 | HR 70 | Ht 61.0 in | Wt 197.0 lb

## 2020-05-26 DIAGNOSIS — M25562 Pain in left knee: Secondary | ICD-10-CM | POA: Diagnosis not present

## 2020-05-26 DIAGNOSIS — M25561 Pain in right knee: Secondary | ICD-10-CM

## 2020-05-26 DIAGNOSIS — H9190 Unspecified hearing loss, unspecified ear: Secondary | ICD-10-CM

## 2020-05-26 DIAGNOSIS — Z0000 Encounter for general adult medical examination without abnormal findings: Secondary | ICD-10-CM

## 2020-05-26 DIAGNOSIS — G479 Sleep disorder, unspecified: Secondary | ICD-10-CM | POA: Diagnosis not present

## 2020-05-26 DIAGNOSIS — E6609 Other obesity due to excess calories: Secondary | ICD-10-CM

## 2020-05-26 DIAGNOSIS — E781 Pure hyperglyceridemia: Secondary | ICD-10-CM

## 2020-05-26 DIAGNOSIS — Z131 Encounter for screening for diabetes mellitus: Secondary | ICD-10-CM

## 2020-05-26 DIAGNOSIS — G8929 Other chronic pain: Secondary | ICD-10-CM

## 2020-05-26 DIAGNOSIS — E782 Mixed hyperlipidemia: Secondary | ICD-10-CM

## 2020-05-26 DIAGNOSIS — Z6837 Body mass index (BMI) 37.0-37.9, adult: Secondary | ICD-10-CM

## 2020-05-26 MED ORDER — BUPROPION HCL ER (SR) 100 MG PO TB12: 100.0000 mg | ORAL_TABLET | Freq: Two times a day (BID) | ORAL | 2 refills | Status: DC

## 2020-05-26 MED ORDER — ATORVASTATIN CALCIUM 40 MG PO TABS: 40.0000 mg | ORAL_TABLET | Freq: Every day | ORAL | 3 refills | Status: DC

## 2020-05-26 MED ORDER — HYDROCHLOROTHIAZIDE 12.5 MG PO TABS: 12.5000 mg | ORAL_TABLET | Freq: Every day | ORAL | 0 refills | Status: DC

## 2020-05-26 MED ORDER — TRAZODONE HCL 50 MG PO TABS: ORAL_TABLET | ORAL | 3 refills | Status: DC

## 2020-05-26 NOTE — Progress Notes (Addendum)
Subjective:   Janice Dudley is a 69 y.o. female who presents for Medicare Annual (Subsequent) preventive examination.  Review of Systems   Struggling with weight loss. She is having some hearing issues with husband and daughter noticing her asking them to repeat themselves. No ringing in ears. She is having bilateral ear pain. They ache often. March 2021 she did step wrong and the knees have ached more since.           Objective:    Today's Vitals   05/26/20 0848  BP: (!) 106/58  Pulse: 70  SpO2: 97%  Weight: 197 lb (89.4 kg)  Height: 5\' 1"  (1.549 m)   Body mass index is 37.22 kg/m.  Advanced Directives 01/21/2019 03/12/2014  Does Patient Have a Medical Advance Directive? Yes No  Type of Paramedic of [place];Living will -  Does patient want to make changes to medical advance directive? No - Patient declined -  Copy of Abanda in Chart? No - copy requested -    Current Medications (verified) Outpatient Encounter Medications as of 05/26/2020  Medication Sig  . albuterol (VENTOLIN HFA) 108 (90 Base) MCG/ACT inhaler INHALE 2 PUFFS INTO THE LUNGS EVERY 6 HOURS AS NEEDED FOR WHEEZING OR SHORTNESS OF BREATH  . aspirin 81 MG tablet Take 81 mg by mouth daily.  Marland Kitchen atorvastatin (LIPITOR) 40 MG tablet Take 1 tablet (40 mg total) by mouth daily.  Marland Kitchen b complex vitamins tablet Take 1 tablet by mouth daily.  . calcium carbonate (TUMS SMOOTHIES) 750 MG chewable tablet Chew 1 tablet by mouth daily.  . cholecalciferol (VITAMIN D) 1000 UNITS tablet Take 1,000 Units by mouth daily.  Marland Kitchen KRILL OIL PO Take by mouth.  . Magnesium 250 MG TABS Take by mouth daily.   Marland Kitchen MELATONIN PO Take 10 mg by mouth at bedtime.   . traZODone (DESYREL) 50 MG tablet TAKE 1 TO 2 TABLETS BY MOUTH EVERY NIGHT 1 HOUR BEFORE BEDTIME AS NEEDED.  . [DISCONTINUED] atorvastatin (LIPITOR) 40 MG tablet Take 1 tablet (40 mg total) by mouth daily. **PATIENT NEEDS OFFICE VISIT  FOR ADDITIONAL REFILLS**  . [DISCONTINUED] traZODone (DESYREL) 50 MG tablet TAKE 1 TO 2 TABLETS BY MOUTH EVERY NIGHT 1 HOUR BEFORE BEDTIME AS NEEDED/ needs appt  . buPROPion (WELLBUTRIN SR) 100 MG 12 hr tablet Take 1 tablet (100 mg total) by mouth 2 (two) times daily.  . [DISCONTINUED] cephALEXin (KEFLEX) 500 MG capsule Take 1 capsule (500 mg total) by mouth 2 (two) times daily.  . [DISCONTINUED] hydrochlorothiazide (HYDRODIURIL) 12.5 MG tablet Take 1 tablet (12.5 mg total) by mouth daily.  . [DISCONTINUED] topiramate (TOPAMAX) 50 MG tablet Take 1 tablet (50 mg total) by mouth 2 (two) times daily.   No facility-administered encounter medications on file as of 05/26/2020.    Allergies (verified) Peach [prunus persica], Sulfa antibiotics, Topamax [topiramate], and Shellfish allergy   History: Past Medical History:  Diagnosis Date  . Cataract    no surgery yet  . Hyperlipidemia    Past Surgical History:  Procedure Laterality Date  . BREAST EXCISIONAL BIOPSY Left 1999  . CESAREAN SECTION  1982/1983   2 times  . COLONOSCOPY  2008  . LAPAROSCOPY     abd/tubal ligation  . TUBAL LIGATION  1988   Family History  Problem Relation Age of Onset  . Stroke Mother 21  . Heart attack Mother 49  . Cancer Mother 52       Uterine  .  Breast cancer Mother 86  . Heart attack Father 70  . Cancer Father 60       Lung  . Stroke Father 9  . Cancer Sister 63       Breast and Uterine   . Hyperlipidemia Sister   . Breast cancer Sister 56  . Thyroid nodules Brother   . Hyperlipidemia Brother   . Thyroid cancer Brother   . Lung cancer Brother   . Prostate cancer Brother   . Hyperlipidemia Sister   . Hyperlipidemia Brother   . Hyperlipidemia Brother   . Colon cancer Neg Hx   . Esophageal cancer Neg Hx   . Rectal cancer Neg Hx    Social History   Socioeconomic History  . Marital status: Married    Spouse name: [name]  . Number of children: 2  . Years of education: 97  . Highest education  level: Master's degree (e.g., MA, MS, MEng, MEd, MSW, MBA)  Occupational History  . Occupation: Pharmacist, hospital    Comment: retired  Tobacco Use  . Smoking status: Former Smoker    Packs/day: 0.25    Years: 20.00    Pack years: 5.00    Types: Cigarettes    Quit date: 05/09/1989    Years since quitting: 31.0  . Smokeless tobacco: Never Used  Vaping Use  . Vaping Use: Never used  Substance and Sexual Activity  . Alcohol use: Yes    Alcohol/week: 7.0 standard drinks    Types: 7 Glasses of wine per week    Comment: wine   . Drug use: No  . Sexual activity: Not Currently  Other Topics Concern  . Not on file  Social History Narrative   Walks when has time.   Caffeine- coffee in the morning   Social Determinants of Health   Financial Resource Strain:   . Difficulty of Paying Living Expenses: Not on file  Food Insecurity:   . Worried About Charity fundraiser in the Last Year: Not on file  . Ran Out of Food in the Last Year: Not on file  Transportation Needs:   . Lack of Transportation (Medical): Not on file  . Lack of Transportation (Non-Medical): Not on file  Physical Activity:   . Days of Exercise per Week: Not on file  . Minutes of Exercise per Session: Not on file  Stress:   . Feeling of Stress : Not on file  Social Connections:   . Frequency of Communication with Friends and Family: Not on file  . Frequency of Social Gatherings with Friends and Family: Not on file  . Attends Religious Services: Not on file  . Active Member of Clubs or Organizations: Not on file  . Attends Archivist Meetings: Not on file  . Marital Status: Not on file    Tobacco Counseling Counseling given: Not Answered   Clinical Intake:                      Activities of Daily Living In your present state of health, do you have any difficulty performing the following activities: 05/26/2020  Hearing? Y  Comment diffculty hearing  Vision? N  Difficulty concentrating or  making decisions? N  Walking or climbing stairs? Y  Comment climbing stairs  Dressing or bathing? N  Doing errands, shopping? N  Some recent data might be hidden    Patient Care Team: Lavada Mesi as PCP - General (Family Medicine)  Indicate any recent  Medical Services you may have received from other than [facility] providers in the past year (date may be approximate).     Assessment:   This is a routine wellness examination for [place].  Hearing/Vision screen  Hearing Screening   125Hz  250Hz  500Hz  1000Hz  2000Hz  3000Hz  4000Hz  6000Hz  8000Hz   Right ear:   40 25 40  40    Left ear:   40 20 40  40      Dietary issues and exercise activities discussed:    Goals    . Exercise 150 min/wk Moderate Activity     Getting back into doing silver sneakers once opens back up after COVID.      Depression Screen PHQ 2/9 Scores 05/26/2020 01/21/2019 01/09/2019 06/22/2017  PHQ - 2 Score 0 0 0 0  PHQ- 9 Score 3 1 1  -    Fall Risk Fall Risk  05/26/2020 01/21/2019  Falls in the past year? 0 1  Number falls in past yr: 0 0  Injury with Fall? 0 1  Risk for fall due to : - Other (Comment)  Risk for fall due to: Comment - fell getting off boat  Follow up Falls evaluation completed Falls prevention discussed    Any stairs in or around the home? Yes  If so, are there any without handrails? Yes  Home free of loose throw rugs in walkways, pet beds, electrical cords, etc? No  Adequate lighting in your home to reduce risk of falls? Yes   ASSISTIVE DEVICES UTILIZED TO PREVENT FALLS:  Life alert? No  Use of a cane, walker or w/c? No  Grab bars in the bathroom? No  Shower chair or bench in shower? No  Elevated toilet seat or a handicapped toilet? No   TIMED UP AND GO:  Was the test performed? Yes .  Length of time to ambulate 10 feet: 4 sec.   Gait steady and fast without use of assistive device  Cognitive Function:     6CIT Screen 05/26/2020 01/21/2019  What Year? 0 points 0  points  What month? 0 points 0 points  What time? 0 points 0 points  Count back from 20 0 points 0 points  Months in reverse 0 points 0 points  Repeat phrase 2 points 0 points  Total Score 2 0    Immunizations Immunization History  Administered Date(s) Administered  . Influenza, High Dose Seasonal PF 05/15/2019  . Influenza,inj,Quad PF,6+ Mos 04/11/2014  . Influenza-Unspecified 06/09/2017, 05/18/2018  . PFIZER SARS-COV-2 Vaccination 08/28/2019, 09/18/2019, 04/24/2020  . PPD Test 09/20/2013  . Pneumococcal Polysaccharide-23 01/09/2019  . Td 09/20/2013  . Tdap 12/26/2018  . Zoster 05/16/2014    TDAP status: Up to date Flu Vaccine status: Declined, Education has been provided regarding the importance of this vaccine but patient still declined. Advised may receive this vaccine at local pharmacy or Health Dept. Aware to provide a copy of the vaccination record if obtained from local pharmacy or Health Dept. Verbalized acceptance and understanding. Pneumococcal vaccine status: Declined,  Education has been provided regarding the importance of this vaccine but patient still declined. Advised may receive this vaccine at local pharmacy or Health Dept. Aware to provide a copy of the vaccination record if obtained from local pharmacy or Health Dept. Verbalized acceptance and understanding.  Covid-19 vaccine status: Completed vaccines  Qualifies for Shingles Vaccine? Yes   Zostavax completed Yes   Shingrix Completed?: No.    Education has been provided regarding the importance of this vaccine. Patient has  been advised to call insurance company to determine out of pocket expense if they have not yet received this vaccine. Advised may also receive vaccine at local pharmacy or Health Dept. Verbalized acceptance and understanding.  Screening Tests Health Maintenance  Topic Date Due  . INFLUENZA VACCINE  10/22/2020 (Originally 02/23/2020)  . PNA vac Low Risk Adult (2 of 2 - PCV13) 05/29/2021  (Originally 01/09/2020)  . MAMMOGRAM  01/19/2022  . COLONOSCOPY  05/23/2028  . TETANUS/TDAP  12/25/2028  . DEXA SCAN  Completed  . COVID-19 Vaccine  Completed  . Hepatitis C Screening  Completed    Health Maintenance  There are no preventive care reminders to display for this patient.  Colorectal cancer screening: Completed 05/23/2018. Repeat every 10 years Mammogram status: Completed 01/20/2020. Repeat every year Bone Density status: Ordered today. Pt provided with contact info and advised to call to schedule appt.  Lung Cancer Screening: (Low Dose CT Chest recommended if Age 23-80 years, 30 pack-year currently smoking OR have quit w/in 15years.) does not qualify.     Additional Screening:  Hepatitis C Screening: does qualify; Completed 03/02/2016 Vision Screening: Recommended annual ophthalmology exams for early detection of glaucoma and other disorders of the eye. Is the patient up to date with their annual eye exam?  Yes  Who is the provider or what is the name of the office in which the patient attends annual eye exams? done If pt is not established with a provider, would they like to be referred to a provider to establish care? Yes .   Dental Screening: Recommended annual dental exams for proper oral hygiene       Plan:     I have personally reviewed and noted the following in the patient's chart:   . Medical and social history . Use of alcohol, tobacco or illicit drugs  . Current medications and supplements . Functional ability and status . Nutritional status . Physical activity . Advanced directives . List of other physicians . Hospitalizations, surgeries, and ER visits in previous 12 months . Vitals . Screenings to include cognitive, depression, and falls . Referrals and appointments  In addition, I have reviewed and discussed with patient certain preventive protocols, quality metrics, and best practice recommendations. A written personalized care plan for  preventive services as well as general preventive health recommendations were provided to patient.   Marland KitchenEarlie Server was seen today for medicare wellness.  Diagnoses and all orders for this visit:  Medicare annual wellness visit, subsequent -     Lipid Panel w/reflex Direct LDL -     COMPLETE METABOLIC PANEL WITH GFR -     TSH  Mixed hyperlipidemia -     atorvastatin (LIPITOR) 40 MG tablet; Take 1 tablet (40 mg total) by mouth daily. -     Lipid Panel w/reflex Direct LDL  Hypertriglyceridemia -     atorvastatin (LIPITOR) 40 MG tablet; Take 1 tablet (40 mg total) by mouth daily. -     Lipid Panel w/reflex Direct LDL  Trouble in sleeping -     traZODone (DESYREL) 50 MG tablet; TAKE 1 TO 2 TABLETS BY MOUTH EVERY NIGHT 1 HOUR BEFORE BEDTIME AS NEEDED.  Screening for diabetes mellitus -     COMPLETE METABOLIC PANEL WITH GFR  Class 2 obesity due to excess calories without serious comorbidity with body mass index (BMI) of 37.0 to 37.9 in adult -     TSH -     buPROPion (WELLBUTRIN SR) 100 MG 12 hr  tablet; Take 1 tablet (100 mg total) by mouth 2 (two) times daily.  Perceived hearing changes  Bilateral chronic knee pain -     DG Knee Complete 4 Views Left -     DG Knee Complete 4 Views Right  Other orders -     Discontinue: hydrochlorothiazide (HYDRODIURIL) 12.5 MG tablet; Take 1 tablet (12.5 mg total) by mouth daily.   Screening labs ordered.  BP to goal.  xrays for knees to evaluate. Sounds more like OA.  NSAIDs as needed.  Consider weight loss. Discussed options. Started wellbutrin SR bid.  Consider IF 16:8.  Follow up in 3 months.   Iran Planas, PA-C   05/26/2020

## 2020-05-26 NOTE — Patient Instructions (Signed)
Health Maintenance After Age 68 After age 68, you are at a higher risk for certain long-term diseases and infections as well as injuries from falls. Falls are a major cause of broken bones and head injuries in people who are older than age 68. Getting regular preventive care can help to keep you healthy and well. Preventive care includes getting regular testing and making lifestyle changes as recommended by your health care provider. Talk with your health care provider about:  Which screenings and tests you should have. A screening is a test that checks for a disease when you have no symptoms.  A diet and exercise plan that is right for you. What should I know about screenings and tests to prevent falls? Screening and testing are the best ways to find a health problem early. Early diagnosis and treatment give you the best chance of managing medical conditions that are common after age 68. Certain conditions and lifestyle choices may make you more likely to have a fall. Your health care provider may recommend:  Regular vision checks. Poor vision and conditions such as cataracts can make you more likely to have a fall. If you wear glasses, make sure to get your prescription updated if your vision changes.  Medicine review. Work with your health care provider to regularly review all of the medicines you are taking, including over-the-counter medicines. Ask your health care provider about any side effects that may make you more likely to have a fall. Tell your health care provider if any medicines that you take make you feel dizzy or sleepy.  Osteoporosis screening. Osteoporosis is a condition that causes the bones to get weaker. This can make the bones weak and cause them to break more easily.  Blood pressure screening. Blood pressure changes and medicines to control blood pressure can make you feel dizzy.  Strength and balance checks. Your health care provider may recommend certain tests to check your  strength and balance while standing, walking, or changing positions.  Foot health exam. Foot pain and numbness, as well as not wearing proper footwear, can make you more likely to have a fall.  Depression screening. You may be more likely to have a fall if you have a fear of falling, feel emotionally low, or feel unable to do activities that you used to do.  Alcohol use screening. Using too much alcohol can affect your balance and may make you more likely to have a fall. What actions can I take to lower my risk of falls? General instructions  Talk with your health care provider about your risks for falling. Tell your health care provider if: ? You fall. Be sure to tell your health care provider about all falls, even ones that seem minor. ? You feel dizzy, sleepy, or off-balance.  Take over-the-counter and prescription medicines only as told by your health care provider. These include any supplements.  Eat a healthy diet and maintain a healthy weight. A healthy diet includes low-fat dairy products, low-fat (lean) meats, and fiber from whole grains, beans, and lots of fruits and vegetables. Home safety  Remove any tripping hazards, such as rugs, cords, and clutter.  Install safety equipment such as grab bars in bathrooms and safety rails on stairs.  Keep rooms and walkways well-lit. Activity   Follow a regular exercise program to stay fit. This will help you maintain your balance. Ask your health care provider what types of exercise are appropriate for you.  If you need a cane or   walker, use it as recommended by your health care provider.  Wear supportive shoes that have nonskid soles. Lifestyle  Do not drink alcohol if your health care provider tells you not to drink.  If you drink alcohol, limit how much you have: ? 0-1 drink a day for women. ? 0-2 drinks a day for men.  Be aware of how much alcohol is in your drink. In the [place] one drink equals one typical bottle of beer (12  oz), one-half glass of wine (5 oz), or one shot of hard liquor (1 oz).  Do not use any products that contain nicotine or tobacco, such as cigarettes and e-cigarettes. If you need help quitting, ask your health care provider. Summary  Having a healthy lifestyle and getting preventive care can help to protect your health and wellness after age 68.  Screening and testing are the best way to find a health problem early and help you avoid having a fall. Early diagnosis and treatment give you the best chance for managing medical conditions that are more common for people who are older than age 68.  Falls are a major cause of broken bones and head injuries in people who are older than age 68. Take precautions to prevent a fall at home.  Work with your health care provider to learn what changes you can make to improve your health and wellness and to prevent falls. This information is not intended to replace advice given to you by your health care provider. Make sure you discuss any questions you have with your health care provider. Document Revised: 11/01/2018 Document Reviewed: 05/24/2017 Elsevier Patient Education  2020 [employer]  

## 2020-05-27 ENCOUNTER — Other Ambulatory Visit: Payer: Self-pay | Admitting: Neurology

## 2020-05-27 ENCOUNTER — Encounter: Payer: Self-pay | Admitting: Physician Assistant

## 2020-05-27 DIAGNOSIS — G8929 Other chronic pain: Secondary | ICD-10-CM

## 2020-05-27 DIAGNOSIS — R748 Abnormal levels of other serum enzymes: Secondary | ICD-10-CM

## 2020-05-27 DIAGNOSIS — M170 Bilateral primary osteoarthritis of knee: Secondary | ICD-10-CM | POA: Insufficient documentation

## 2020-05-27 DIAGNOSIS — M25561 Pain in right knee: Secondary | ICD-10-CM

## 2020-05-27 LAB — COMPLETE METABOLIC PANEL WITH GFR
AG Ratio: 1.8 (calc) (ref 1.0–2.5)
ALT: 47 U/L — ABNORMAL HIGH (ref 6–29)
AST: 34 U/L (ref 10–35)
Albumin: 4.4 g/dL (ref 3.6–5.1)
Alkaline phosphatase (APISO): 80 U/L (ref 37–153)
BUN: 13 mg/dL (ref 7–25)
CO2: 25 mmol/L (ref 20–32)
Calcium: 10.1 mg/dL (ref 8.6–10.4)
Chloride: 103 mmol/L (ref 98–110)
Creat: 0.91 mg/dL (ref 0.50–0.99)
GFR, Est African American: 75 mL/min/{1.73_m2} (ref >=60)
GFR, Est Non African American: 65 mL/min/{1.73_m2} (ref >=60)
Globulin: 2.4 g/dL (calc) (ref 1.9–3.7)
Glucose, Bld: 97 mg/dL (ref 65–99)
Potassium: 4.1 mmol/L (ref 3.5–5.3)
Sodium: 137 mmol/L (ref 135–146)
Total Bilirubin: 0.8 mg/dL (ref 0.2–1.2)
Total Protein: 6.8 g/dL (ref 6.1–8.1)

## 2020-05-27 LAB — LIPID PANEL W/REFLEX DIRECT LDL
Cholesterol: 166 mg/dL (ref <200)
HDL: 57 mg/dL (ref >=50)
LDL Cholesterol (Calc): 86 mg/dL (calc)
Non-HDL Cholesterol (Calc): 109 mg/dL (calc) (ref <130)
Total CHOL/HDL Ratio: 2.9 (calc) (ref <5.0)
Triglycerides: 131 mg/dL (ref <150)

## 2020-05-27 LAB — TSH: TSH: 3.24 mIU/L (ref 0.40–4.50)

## 2020-05-27 MED ORDER — HYDROCHLOROTHIAZIDE 12.5 MG PO TABS
12.5000 mg | ORAL_TABLET | Freq: Every day | ORAL | 0 refills | Status: DC
Start: 2020-05-27 — End: 2020-12-25

## 2020-05-27 NOTE — Progress Notes (Signed)
[name],   Cholesterol looks great. Kidney and glucose looks great.  Thyroid normal range and stable.  One liver enzymes is elevated from last recheck. Stop any alcohol and tylenol usage and recheck in 2 weeks.

## 2020-05-27 NOTE — Progress Notes (Signed)
[name],   Bilateral knee arthritis and degenerative changes. You would likely benefit from injection and NSAIDs and PT. Would you like to go to formal PT?

## 2020-05-28 NOTE — Telephone Encounter (Signed)
Physical therapy referral placed. Message sent to patient.

## 2020-05-29 ENCOUNTER — Encounter: Payer: Self-pay | Admitting: Physician Assistant

## 2020-06-02 ENCOUNTER — Encounter: Payer: Self-pay | Admitting: Rehabilitative and Restorative Service Providers"

## 2020-06-02 ENCOUNTER — Other Ambulatory Visit: Payer: Self-pay

## 2020-06-02 ENCOUNTER — Ambulatory Visit (INDEPENDENT_AMBULATORY_CARE_PROVIDER_SITE_OTHER): Payer: Medicare PPO | Admitting: Rehabilitative and Restorative Service Providers"

## 2020-06-02 DIAGNOSIS — M25561 Pain in right knee: Secondary | ICD-10-CM

## 2020-06-02 DIAGNOSIS — M6281 Muscle weakness (generalized): Secondary | ICD-10-CM

## 2020-06-02 DIAGNOSIS — M25562 Pain in left knee: Secondary | ICD-10-CM | POA: Diagnosis not present

## 2020-06-02 DIAGNOSIS — R2689 Other abnormalities of gait and mobility: Secondary | ICD-10-CM

## 2020-06-02 DIAGNOSIS — G8929 Other chronic pain: Secondary | ICD-10-CM

## 2020-06-02 DIAGNOSIS — R601 Generalized edema: Secondary | ICD-10-CM

## 2020-06-02 NOTE — Therapy (Signed)
[place], [place], [address] Phone: [phone]   Fax:  [phone]  Physical Therapy Evaluation  Patient Details  Name: Janice Dudley MRN: [id number] Date of Birth: 02-Jul-1951 Referring Provider (PT): Dr [name]   Encounter Date: 06/02/2020   PT End of Session - 06/02/20 1302    Visit Number 2    Number of Visits 12    Date for PT Re-Evaluation 07/14/20    Authorization Type [employer]    Authorization Time Period 06/02/20- 07/14/20    Authorization - Visit Number 1    Authorization - Number of Visits 12    Progress Note Due on Visit 10    PT Start Time 4656    PT Stop Time 0939    PT Time Calculation (min) 45 min    Activity Tolerance Patient tolerated treatment well           Past Medical History:  Diagnosis Date  . Cataract    no surgery yet  . Hyperlipidemia     Past Surgical History:  Procedure Laterality Date  . BREAST EXCISIONAL BIOPSY Left 1999  . CESAREAN SECTION  1982/1983   2 times  . COLONOSCOPY  2008  . LAPAROSCOPY     abd/tubal ligation  . TUBAL LIGATION  1988    There were no vitals filed for this visit.    Subjective Assessment - 06/02/20 0900    Subjective Patient reports that she has had Lt knee pain since 3/21 when she was running to jumping onto a tram. Lt knee was irritated agaiin ziplining 8/21. Now both knees are hurting. Scheduled for injections for both knees next week.    Pertinent History arthritis    Patient Stated Goals to be able to walk like a normoal person; to be able to walk up and down stairs in her house    Currently in Pain? Yes    Pain Score 5     Pain Location Knee    Pain Orientation Right;Left    Pain Descriptors / Indicators Dull;Nagging    Pain Type Chronic pain    Pain Radiating Towards Rt hip pain after walking a lot    Pain Onset More than a month ago    Pain Frequency Intermittent    Aggravating Factors  stairs;  walking; getting up in the morning; driving getting out of the car; life    Pain Relieving Factors meds; topical analgesic              [facility] PT Assessment - 06/02/20 0001      Assessment   Medical Diagnosis Bilat knee pain     Referring Provider (PT) Dr [name]    Onset Date/Surgical Date 09/23/19    Hand Dominance Right;Left    Next MD Visit 10/07/19      Precautions   Precautions None      Restrictions   Weight Bearing Restrictions No      Balance Screen   Has the patient fallen in the past 6 months No    Has the patient had a decrease in activity level because of a fear of falling?  No    Is the patient reluctant to leave their home because of a fear of falling?  No      Home Environment   Living Environment Private residence    Living Arrangements Spouse/significant other    Type of [place] to enter  Entrance Stairs-Number of Steps 6    Home Layout Two level      Prior Function   Level of Independence Independent    Vocation Retired    Museum/gallery curator retired 7 yrs ago     Leisure houshold cores; laundry; reading; travel in RV      Observation/Other Assessments   Focus on Therapeutic Outcomes (FOTO)  60% limitation       Observation/Other Assessments-Edema    Edema --   edema at times      Sensation   Additional Comments WNL's per pt report       AROM   Right Knee Extension -9    Right Knee Flexion 109    Left Knee Extension -5    Left Knee Flexion 106      Strength   Right Hip Flexion 4+/5    Right Hip Extension 4-/5    Right Hip ABduction 4/5    Right Hip ADduction 4+/5    Left Hip Flexion 4+/5    Left Hip Extension 4-/5    Left Hip ABduction 4/5    Left Hip ADduction 4+/5    Right Knee Flexion 5/5    Right Knee Extension 4+/5    Left Knee Flexion 5/5    Left Knee Extension 4/5      Flexibility   Hamstrings tight end ranges with some knee flexion noted    Quadriceps tight ~ 90 deg when in  prone    ITB tight end ranges    Piriformis tight bilat       Palpation   Palpation comment muscular tightness through the hip flexors, quads; posterior hip/gluts/piriformis bilat       Ambulation/Gait   Gait Comments antalgic gait       Balance   Balance Assessed --   SLS Rt 7 sec; Lt 5 sec                      Objective measurements completed on examination: See above findings.       [facility] Adult PT Treatment/Exercise - 06/02/20 0001      Knee/Hip Exercises: Stretches   Passive Hamstring Stretch Right;Left;2 reps;30 seconds   supine with strap    Quad Stretch Right;Left;2 reps;30 seconds   prone with strap    ITB Stretch Right;Left;2 reps;30 seconds   supine with strap    Piriformis Stretch Right;Left;2 reps;30 seconds   supine travell                  PT Education - 06/02/20 0932    Education Details HEP POC    Person(s) Educated Patient    Methods Explanation;Demonstration;Tactile cues;Verbal cues;Handout    Comprehension Verbalized understanding;Returned demonstration;Verbal cues required;Tactile cues required               PT Long Term Goals - 06/02/20 2138      PT LONG TERM GOAL #1   Title Decrease pain in bilat knees 50-75% for functional activities    Time 6    Period Weeks    Status New    Target Date 07/14/20      PT LONG TERM GOAL #2   Title Increase AROM bilat knees 24 degrees in extension and 5-7 deg in flexion    Time 6    Period Weeks    Status New    Target Date 07/14/20      PT LONG TERM GOAL #3   Title Patient  reports ability to walk for 20-30 min and ascend/descend 10-12 steps with minimal to no increase in knee pain    Time 6    Period Weeks    Status New    Target Date 07/14/20      PT LONG TERM GOAL #4   Title Independent in HEP    Time 6    Period Weeks    Status New    Target Date 07/14/20      PT LONG TERM GOAL #5   Title Improve FOTO to </= 41% limitation    Time 6    Period Weeks    Status New     Target Date 07/14/20                  Plan - 06/02/20 1305    Clinical Impression Statement Patient presents with bilat knee pain present since 3/21 with increased symptoms following increase in activity ziplining 8/21. Paitent has antalgic gait; decreased balance; decreased mobility/ROM bilat hips and knees; decreased strength bilat LE's; decreased functional activity level; pain on a daily basis. Patient will benefit from PT to address problems identified    Personal Factors and Comorbidities Age;Fitness;Behavior Pattern;Time since onset of injury/illness/exacerbation    Examination-Activity Limitations Transfers;Bend;Carry;Lift;Sit;Squat;Stairs;Stand;Locomotion Level    Examination-Participation Restrictions Cleaning;Yard Work;Other    Stability/Clinical Decision Making Evolving/Moderate complexity    Clinical Decision Making Moderate    Rehab Potential Good    PT Frequency 2x / week    PT Duration 6 weeks    PT Treatment/Interventions ADLs/Self Care Home Management;Aquatic Therapy;Cryotherapy;Electrical Stimulation;Iontophoresis 4mg /ml Dexamethasone;Moist Heat;Ultrasound;Gait training;Stair training;Functional mobility training;Therapeutic activities;Therapeutic exercise;Balance training;Neuromuscular re-education;Manual techniques;Passive range of motion;Dry needling;Taping;Vasopneumatic Device    PT Next Visit Plan review HEP; add stretch for hip flexors; add strengthening LE's; myofacial release bilat LE's; body mechanics/transfers    PT Home Exercise Plan [email or web address]    Consulted and Agree with Plan of Care Patient           Patient will benefit from skilled therapeutic intervention in order to improve the following deficits and impairments:  Abnormal gait, Decreased range of motion, Difficulty walking, Decreased activity tolerance, Pain, Decreased balance, Impaired flexibility, Improper body mechanics, Decreased mobility, Decreased strength, Increased edema, Postural  dysfunction  Visit Diagnosis: Chronic pain of left knee - Plan: PT plan of care cert/re-cert  Chronic pain of right knee - Plan: PT plan of care cert/re-cert  Other abnormalities of gait and mobility - Plan: PT plan of care cert/re-cert  Muscle weakness (generalized) - Plan: PT plan of care cert/re-cert  Generalized edema - Plan: PT plan of care cert/re-cert     Problem List Patient Active Problem List   Diagnosis Date Noted  . Osteoarthritis of knees, bilateral 05/27/2020  . Mixed hyperlipidemia 05/26/2020  . Perceived hearing changes 05/26/2020  . Left breast mass 01/14/2019  . Hypotension 01/14/2019  . Laceration of right lower extremity 01/14/2019  . H/O osteopenia 11/29/2017  . Strain of abdominal muscle 09/28/2017  . No energy 06/22/2017  . Trouble in sleeping 06/22/2017  . Atypical pigmented skin lesion 03/02/2016  . Post-menopausal 03/02/2016  . Abnormal weight gain 05/16/2014  . Hypertriglyceridemia 04/15/2014  . Obesity 04/15/2014  . Fibrocystic breast 03/25/2014  . Hyperlipidemia     Haynes Giannotti Nilda Simmer PT, MPH  06/02/2020, 9:43 PM  [facility] [place], [place], [address] Phone: [phone]   Fax:  [phone]  Name: Raynah Gomes MRN: [id number] Date of Birth:  03/08/1951  

## 2020-06-02 NOTE — Patient Instructions (Signed)
Access Code: [email or web address]: [email or web address]: 11/09/2021Prepared by: Seona Clemenson HoltExercises  Prone Quadriceps Stretch with Strap - 2 x daily - 7 x weekly - 1 sets - 3 reps - 30 sec hold  Hooklying Hamstring Stretch with Strap - 2 x daily - 7 x weekly - 1 sets - 3 reps - 30 sec hold  Supine ITB Stretch with Strap - 2 x daily - 7 x weekly - 1 sets - 3 reps - 30 sec hold  Supine Piriformis Stretch with Leg Straight - 2 x daily - 7 x weekly - 1 sets - 3 reps - 30 sec hold

## 2020-06-04 ENCOUNTER — Other Ambulatory Visit: Payer: Self-pay

## 2020-06-04 ENCOUNTER — Encounter: Payer: Self-pay | Admitting: Rehabilitative and Restorative Service Providers"

## 2020-06-04 ENCOUNTER — Ambulatory Visit (INDEPENDENT_AMBULATORY_CARE_PROVIDER_SITE_OTHER): Payer: Medicare PPO | Admitting: Rehabilitative and Restorative Service Providers"

## 2020-06-04 DIAGNOSIS — M6281 Muscle weakness (generalized): Secondary | ICD-10-CM

## 2020-06-04 DIAGNOSIS — M25562 Pain in left knee: Secondary | ICD-10-CM

## 2020-06-04 DIAGNOSIS — R2689 Other abnormalities of gait and mobility: Secondary | ICD-10-CM | POA: Diagnosis not present

## 2020-06-04 DIAGNOSIS — M25561 Pain in right knee: Secondary | ICD-10-CM

## 2020-06-04 DIAGNOSIS — R601 Generalized edema: Secondary | ICD-10-CM

## 2020-06-04 DIAGNOSIS — G8929 Other chronic pain: Secondary | ICD-10-CM

## 2020-06-04 NOTE — Therapy (Signed)
[place], [place], [address] Phone: [phone]   Fax:  [phone]  Physical Therapy Treatment  Patient Details  Name: Janice Dudley MRN: [id number] Date of Birth: 1950/10/09 Referring Provider (PT): Dr [name]   Encounter Date: 06/04/2020   PT End of Session - 06/04/20 1017    Visit Number 2    Number of Visits 12    Date for PT Re-Evaluation 07/14/20    Authorization Type [employer]    Authorization Time Period 06/02/20- 07/14/20    Authorization - Visit Number 2    Authorization - Number of Visits 12    Progress Note Due on Visit 10    PT Start Time 8299    PT Stop Time 1100    PT Time Calculation (min) 45 min    Activity Tolerance Patient tolerated treatment well           Past Medical History:  Diagnosis Date  . Cataract    no surgery yet  . Hyperlipidemia     Past Surgical History:  Procedure Laterality Date  . BREAST EXCISIONAL BIOPSY Left 1999  . CESAREAN SECTION  1982/1983   2 times  . COLONOSCOPY  2008  . LAPAROSCOPY     abd/tubal ligation  . TUBAL LIGATION  1988    There were no vitals filed for this visit.   Subjective Assessment - 06/04/20 1021    Subjective Patient reports that her daughter had a medical crisis and the daughter and granddaughter are now living with her. She has not had an opportunity to do her exercises at all.    Currently in Pain? Yes    Pain Score 5     Pain Location Knee    Pain Orientation Right;Left    Pain Descriptors / Indicators Dull;Nagging    Pain Type Chronic pain                             [facility] Adult PT Treatment/Exercise - 06/04/20 0001      Therapeutic Activites    Therapeutic Activities Other Therapeutic Activities    Other Therapeutic Activities myofacial release work through [employer] using massage stick       Knee/Hip Exercises: Stretches   Passive Hamstring Stretch Right;Left;30 seconds;3  reps   supine with strap    Quad Stretch Right;Left;3 reps;30 seconds    Hip Flexor Stretch Right;Left;2 reps;30 seconds   seated    ITB Stretch Right;Left;2 reps;30 seconds   supine with strap    Piriformis Stretch Right;Left;2 reps;30 seconds   supine travell      Knee/Hip Exercises: Aerobic   Nustep L5 x 7 min U/LE's                   PT Education - 06/04/20 1034    Education Details HEP    Person(s) Educated Patient    Methods Explanation;Demonstration;Tactile cues;Verbal cues;Handout    Comprehension Verbalized understanding;Returned demonstration;Verbal cues required;Tactile cues required               PT Long Term Goals - 06/02/20 2138      PT LONG TERM GOAL #1   Title Decrease pain in bilat knees 50-75% for functional activities    Time 6    Period Weeks    Status New    Target Date 07/14/20      PT LONG TERM GOAL #2   Title Increase  AROM bilat knees 24 degrees in extension and 5-7 deg in flexion    Time 6    Period Weeks    Status New    Target Date 07/14/20      PT LONG TERM GOAL #3   Title Patient reports ability to walk for 20-30 min and ascend/descend 10-12 steps with minimal to no increase in knee pain    Time 6    Period Weeks    Status New    Target Date 07/14/20      PT LONG TERM GOAL #4   Title Independent in HEP    Time 6    Period Weeks    Status New    Target Date 07/14/20      PT LONG TERM GOAL #5   Title Improve FOTO to </= 41% limitation    Time 6    Period Weeks    Status New    Target Date 07/14/20                 Plan - 06/04/20 1029    Clinical Impression Statement Patient unable to work on exercises at home due to family crisis. Reviewed HEP. Discussed footware - encouraged shoe with good support such as a walking/running shoe. Added myofacial release with massage stick.    Rehab Potential Good    PT Frequency 2x / week    PT Duration 6 weeks    PT Treatment/Interventions ADLs/Self Care Home  Management;Aquatic Therapy;Cryotherapy;Electrical Stimulation;Iontophoresis 4mg /ml Dexamethasone;Moist Heat;Ultrasound;Gait training;Stair training;Functional mobility training;Therapeutic activities;Therapeutic exercise;Balance training;Neuromuscular re-education;Manual techniques;Passive range of motion;Dry needling;Taping;Vasopneumatic Device    PT Next Visit Plan review HEP; add stretch for hip flexors; add strengthening LE's; myofacial release bilat LE's; body mechanics/transfers    PT Home Exercise Plan [email or web address]    Consulted and Agree with Plan of Care Patient           Patient will benefit from skilled therapeutic intervention in order to improve the following deficits and impairments:     Visit Diagnosis: Chronic pain of left knee  Chronic pain of right knee  Other abnormalities of gait and mobility  Muscle weakness (generalized)  Generalized edema     Problem List Patient Active Problem List   Diagnosis Date Noted  . Osteoarthritis of knees, bilateral 05/27/2020  . Mixed hyperlipidemia 05/26/2020  . Perceived hearing changes 05/26/2020  . Left breast mass 01/14/2019  . Hypotension 01/14/2019  . Laceration of right lower extremity 01/14/2019  . H/O osteopenia 11/29/2017  . Strain of abdominal muscle 09/28/2017  . No energy 06/22/2017  . Trouble in sleeping 06/22/2017  . Atypical pigmented skin lesion 03/02/2016  . Post-menopausal 03/02/2016  . Abnormal weight gain 05/16/2014  . Hypertriglyceridemia 04/15/2014  . Obesity 04/15/2014  . Fibrocystic breast 03/25/2014  . Hyperlipidemia     Nate Common Nilda Simmer PT, MPH  06/04/2020, 10:57 AM  [facility] [place], [place], [address] Phone: [phone]   Fax:  [phone]  Name: Janice Dudley MRN: [id number] Date of Birth: Jul 10, 1951

## 2020-06-08 ENCOUNTER — Ambulatory Visit (INDEPENDENT_AMBULATORY_CARE_PROVIDER_SITE_OTHER): Payer: Medicare PPO | Admitting: Sports Medicine

## 2020-06-08 ENCOUNTER — Other Ambulatory Visit: Payer: Self-pay

## 2020-06-08 ENCOUNTER — Ambulatory Visit (INDEPENDENT_AMBULATORY_CARE_PROVIDER_SITE_OTHER): Payer: Medicare PPO

## 2020-06-08 ENCOUNTER — Ambulatory Visit: Payer: Medicare PPO | Admitting: Sports Medicine

## 2020-06-08 DIAGNOSIS — M170 Bilateral primary osteoarthritis of knee: Secondary | ICD-10-CM

## 2020-06-08 NOTE — Progress Notes (Signed)
    Procedures performed today:    Procedure: Real-time Ultrasound Guided injection of the left knee Device: Samsung HS60  Verbal informed consent obtained.  Time-out conducted.  Noted no overlying erythema, induration, or other signs of local infection.  Skin prepped in a sterile fashion.  Local anesthesia: Topical Ethyl chloride.  With sterile technique and under real time ultrasound guidance:  Noted moderate effusion 1 cc Kenalog 40, 2 cc lidocaine, 2 cc bupivacaine injected easily Completed without difficulty  Advised to call if fevers/chills, erythema, induration, drainage, or persistent bleeding.  Images permanently stored and available for review in PACS.  Impression: Technically successful ultrasound guided injection.  Procedure: Real-time Ultrasound Guided injection of the right knee Device: Samsung HS60  Verbal informed consent obtained.  Time-out conducted.  Noted no overlying erythema, induration, or other signs of local infection.  Skin prepped in a sterile fashion.  Local anesthesia: Topical Ethyl chloride.  With sterile technique and under real time ultrasound guidance:  Noted moderate effusion 1 cc Kenalog 40, 2 cc lidocaine, 2 cc bupivacaine injected easily Completed without difficulty  Advised to call if fevers/chills, erythema, induration, drainage, or persistent bleeding.  Images permanently stored and available for review in PACS.  Impression: Technically successful ultrasound guided injection.  Independent interpretation of notes and tests performed by another provider:   None.  Brief History, Exam, Impression, and Recommendations:    Primary osteoarthritis of both knees This is a pleasant 69 year old female, she has bilateral knee pain chronically now, she is tried oral medications including ibuprofen, Tylenol, physical therapy, unfortunately continues to have pain, she is referred to me for further evaluation and definitive treatment, x-rays do show  bilateral knee osteoarthritis, I injected both of her knees today, return to see me in a month, we did discuss viscosupplementation in the future if needed.    ___________________________________________ [name]. [name], M.D., ABFM., CAQSM. Primary Care and [place] Instructor of [place] [employer] [facility] [employer] [facility]

## 2020-06-08 NOTE — Assessment & Plan Note (Signed)
This is a pleasant 69 year old female, she has bilateral knee pain chronically now, she is tried oral medications including ibuprofen, Tylenol, physical therapy, unfortunately continues to have pain, she is referred to me for further evaluation and definitive treatment, x-rays do show bilateral knee osteoarthritis, I injected both of her knees today, return to see me in a month, we did discuss viscosupplementation in the future if needed.

## 2020-06-09 ENCOUNTER — Encounter: Payer: Self-pay | Admitting: Physician Assistant

## 2020-06-09 LAB — COMPLETE METABOLIC PANEL WITH GFR
AG Ratio: 1.7 (calc) (ref 1.0–2.5)
ALT: 32 U/L — ABNORMAL HIGH (ref 6–29)
AST: 28 U/L (ref 10–35)
Albumin: 4.3 g/dL (ref 3.6–5.1)
Alkaline phosphatase (APISO): 74 U/L (ref 37–153)
BUN: 14 mg/dL (ref 7–25)
CO2: 26 mmol/L (ref 20–32)
Calcium: 9.9 mg/dL (ref 8.6–10.4)
Chloride: 101 mmol/L (ref 98–110)
Creat: 0.89 mg/dL (ref 0.50–0.99)
GFR, Est African American: 77 mL/min/{1.73_m2} (ref >=60)
GFR, Est Non African American: 67 mL/min/{1.73_m2} (ref >=60)
Globulin: 2.5 g/dL (calc) (ref 1.9–3.7)
Glucose, Bld: 84 mg/dL (ref 65–99)
Potassium: 4.2 mmol/L (ref 3.5–5.3)
Sodium: 136 mmol/L (ref 135–146)
Total Bilirubin: 0.5 mg/dL (ref 0.2–1.2)
Total Protein: 6.8 g/dL (ref 6.1–8.1)

## 2020-06-09 NOTE — Progress Notes (Signed)
ALT decreased but still not quite to normal range. Do you know what you changed in the last 2 weeks. We could just continue to work on diet, avoid alcohol and monitor for a few more months since improving.

## 2020-06-11 ENCOUNTER — Other Ambulatory Visit: Payer: Self-pay

## 2020-06-11 ENCOUNTER — Ambulatory Visit (INDEPENDENT_AMBULATORY_CARE_PROVIDER_SITE_OTHER): Payer: Medicare PPO | Admitting: Rehabilitative and Restorative Service Providers"

## 2020-06-11 ENCOUNTER — Encounter: Payer: Self-pay | Admitting: Rehabilitative and Restorative Service Providers"

## 2020-06-11 DIAGNOSIS — R601 Generalized edema: Secondary | ICD-10-CM

## 2020-06-11 DIAGNOSIS — R2689 Other abnormalities of gait and mobility: Secondary | ICD-10-CM | POA: Diagnosis not present

## 2020-06-11 DIAGNOSIS — M25562 Pain in left knee: Secondary | ICD-10-CM

## 2020-06-11 DIAGNOSIS — G8929 Other chronic pain: Secondary | ICD-10-CM

## 2020-06-11 DIAGNOSIS — M25561 Pain in right knee: Secondary | ICD-10-CM | POA: Diagnosis not present

## 2020-06-11 DIAGNOSIS — M6281 Muscle weakness (generalized): Secondary | ICD-10-CM | POA: Diagnosis not present

## 2020-06-11 NOTE — Therapy (Signed)
[place], [place], [address] Phone: [phone]   Fax:  [phone]  Physical Therapy Treatment  Patient Details  Name: Thaily Hackworth MRN: [id number] Date of Birth: 13-Jul-1951 Referring Provider (PT): Dr [name]   Encounter Date: 06/11/2020   PT End of Session - 06/11/20 1020    Visit Number 3    Number of Visits 12    Date for PT Re-Evaluation 07/14/20    Authorization Type [employer]    Authorization Time Period 06/02/20- 07/14/20    Authorization - Visit Number 3    Authorization - Number of Visits 12    Progress Note Due on Visit 10    PT Start Time 9417    PT Stop Time 1100    PT Time Calculation (min) 45 min    Activity Tolerance Patient tolerated treatment well           Past Medical History:  Diagnosis Date  . Cataract    no surgery yet  . Hyperlipidemia     Past Surgical History:  Procedure Laterality Date  . BREAST EXCISIONAL BIOPSY Left 1999  . CESAREAN SECTION  1982/1983   2 times  . COLONOSCOPY  2008  . LAPAROSCOPY     abd/tubal ligation  . TUBAL LIGATION  1988    There were no vitals filed for this visit.   Subjective Assessment - 06/11/20 1020    Subjective Received injections in both knees Monday. Knees feel much better - did have some reactioin to the steroids the following day which resolved that same day with no treatment. She has not done her exercises since injections.    Currently in Pain? No/denies    Pain Score 0-No pain                             [facility] Adult PT Treatment/Exercise - 06/11/20 0001      Knee/Hip Exercises: Stretches   Passive Hamstring Stretch Right;Left;1 rep;30 seconds   supine with strap    Quad Stretch Right;Left;1 rep;30 seconds    Hip Flexor Stretch Right;Left;2 reps;30 seconds   seated    ITB Stretch Right;Left;1 rep;30 seconds   supine with strap    Piriformis Stretch Right;Left;1 rep;30  seconds   supine travell    Gastroc Stretch Right;Left;2 reps;30 seconds    Gastroc Stretch Limitations felt stretch posterior knee     Soleus Stretch 2 reps;30 seconds      Knee/Hip Exercises: Aerobic   Nustep L5 x 6 min U(10)/LE's       Knee/Hip Exercises: Standing   Hip Abduction AROM;Stengthening;Right;Left;10 reps;Knee straight   leading with heel    Forward Step Up Right;Left;10 reps;Hand Hold: 2;Step Height: 6"      Knee/Hip Exercises: Seated   Sit to Sand 10 reps;without UE support   elevated table      Knee/Hip Exercises: Sidelying   Hip ABduction AROM;Strengthening;Right;Left;10 reps   leading with heel hips forward hip extended                  PT Education - 06/11/20 1058    Education Details HEP    Person(s) Educated Patient    Methods Explanation;Demonstration;Tactile cues;Verbal cues;Handout    Comprehension Verbalized understanding;Returned demonstration;Verbal cues required;Tactile cues required               PT Long Term Goals - 06/02/20 2138  PT LONG TERM GOAL #1   Title Decrease pain in bilat knees 50-75% for functional activities    Time 6    Period Weeks    Status New    Target Date 07/14/20      PT LONG TERM GOAL #2   Title Increase AROM bilat knees 24 degrees in extension and 5-7 deg in flexion    Time 6    Period Weeks    Status New    Target Date 07/14/20      PT LONG TERM GOAL #3   Title Patient reports ability to walk for 20-30 min and ascend/descend 10-12 steps with minimal to no increase in knee pain    Time 6    Period Weeks    Status New    Target Date 07/14/20      PT LONG TERM GOAL #4   Title Independent in HEP    Time 6    Period Weeks    Status New    Target Date 07/14/20      PT LONG TERM GOAL #5   Title Improve FOTO to </= 41% limitation    Time 6    Period Weeks    Status New    Target Date 07/14/20                 Plan - 06/11/20 1022    Clinical Impression Statement Excellent response  to injections bilat knees Monday with resolution of knee pain. Continued ther exercise to improve strength bilat LE's. Discussion of importance of strengthening LE's while knees are less painful. Progressing with ROM and strengthening.    Rehab Potential Good    PT Frequency 2x / week    PT Duration 6 weeks    PT Treatment/Interventions ADLs/Self Care Home Management;Aquatic Therapy;Cryotherapy;Electrical Stimulation;Iontophoresis 4mg /ml Dexamethasone;Moist Heat;Ultrasound;Gait training;Stair training;Functional mobility training;Therapeutic activities;Therapeutic exercise;Balance training;Neuromuscular re-education;Manual techniques;Passive range of motion;Dry needling;Taping;Vasopneumatic Device    PT Next Visit Plan review HEP; continue strengthening LE's; myofacial release bilat LE's; body mechanics/transfers    PT Home Exercise Plan [email or web address]    Consulted and Agree with Plan of Care Patient           Patient will benefit from skilled therapeutic intervention in order to improve the following deficits and impairments:     Visit Diagnosis: Chronic pain of left knee  Chronic pain of right knee  Other abnormalities of gait and mobility  Muscle weakness (generalized)  Generalized edema     Problem List Patient Active Problem List   Diagnosis Date Noted  . Primary osteoarthritis of both knees 05/27/2020  . Mixed hyperlipidemia 05/26/2020  . Perceived hearing changes 05/26/2020  . Left breast mass 01/14/2019  . Hypotension 01/14/2019  . Laceration of right lower extremity 01/14/2019  . H/O osteopenia 11/29/2017  . Strain of abdominal muscle 09/28/2017  . No energy 06/22/2017  . Trouble in sleeping 06/22/2017  . Atypical pigmented skin lesion 03/02/2016  . Post-menopausal 03/02/2016  . Abnormal weight gain 05/16/2014  . Hypertriglyceridemia 04/15/2014  . Obesity 04/15/2014  . Fibrocystic breast 03/25/2014  . Hyperlipidemia     Ramadan Couey Nilda Simmer PT, MPH  06/11/2020, 11:06  AM  [facility] [place], [place], [address] Phone: [phone]   Fax:  [phone]  Name: Aamira Bischoff MRN: [id number] Date of Birth: [birth year]

## 2020-06-11 NOTE — Patient Instructions (Signed)
Access Code: [email or web address]: [email or web address]: 11/18/2021Prepared by: Jacqulyne Gladue HoltExercises  Prone Quadriceps Stretch with Strap - 2 x daily - 7 x weekly - 1 sets - 3 reps - 30 sec hold  Hooklying Hamstring Stretch with Strap - 2 x daily - 7 x weekly - 1 sets - 3 reps - 30 sec hold  Supine ITB Stretch with Strap - 2 x daily - 7 x weekly - 1 sets - 3 reps - 30 sec hold  Supine Piriformis Stretch with Leg Straight - 2 x daily - 7 x weekly - 1 sets - 3 reps - 30 sec hold  Standing Gastroc Stretch - 2 x daily - 7 x weekly - 1 sets - 3 reps - 30 sec hold  Soleus Stretch on Wall - 2 x daily - 7 x weekly - 1 sets - 3 reps - 30 sec hold  Step Up - 2 x daily - 7 x weekly - 1 sets - 10 reps - 2 sec hold  Standing Hip Abduction with Counter Support - 2 x daily - 7 x weekly - 2-3 sets - 10 reps - 2-3 sec hold  Sit to Stand without Arm Support - 2 x daily - 7 x weekly - 1 sets - 10 reps - 3-5sec hold  Sidelying Hip Abduction - 2 x daily - 7 x weekly - 1 sets - 10 reps - 3-5 sec hold

## 2020-06-17 ENCOUNTER — Other Ambulatory Visit: Payer: Self-pay

## 2020-06-17 ENCOUNTER — Ambulatory Visit (INDEPENDENT_AMBULATORY_CARE_PROVIDER_SITE_OTHER): Payer: Medicare PPO | Admitting: Physical Therapy

## 2020-06-17 ENCOUNTER — Encounter: Payer: Self-pay | Admitting: Physical Therapy

## 2020-06-17 DIAGNOSIS — M25561 Pain in right knee: Secondary | ICD-10-CM | POA: Diagnosis not present

## 2020-06-17 DIAGNOSIS — M25562 Pain in left knee: Secondary | ICD-10-CM | POA: Diagnosis not present

## 2020-06-17 DIAGNOSIS — M6281 Muscle weakness (generalized): Secondary | ICD-10-CM | POA: Diagnosis not present

## 2020-06-17 DIAGNOSIS — R2689 Other abnormalities of gait and mobility: Secondary | ICD-10-CM

## 2020-06-17 DIAGNOSIS — G8929 Other chronic pain: Secondary | ICD-10-CM

## 2020-06-17 NOTE — Therapy (Signed)
[facility] [place], [place], [address] Phone: [phone]   Fax:  [phone]  Physical Therapy Treatment  Patient Details  Name: Janice Dudley MRN: [id number] Date of Birth: 11-Apr-1951 Referring Provider (PT): Dr [name]   Encounter Date: 06/17/2020   PT End of Session - 06/17/20 1108    Visit Number 4    Number of Visits 12    Date for PT Re-Evaluation 07/14/20    Authorization Type [employer]    Authorization Time Period 06/02/20- 07/14/20    Authorization - Visit Number 4    Authorization - Number of Visits 12    Progress Note Due on Visit 10    PT Start Time 1106    PT Stop Time 1147    PT Time Calculation (min) 41 min    Activity Tolerance Patient tolerated treatment well    Behavior During Therapy [facility] for tasks assessed/performed           Past Medical History:  Diagnosis Date  . Cataract    no surgery yet  . Hyperlipidemia     Past Surgical History:  Procedure Laterality Date  . BREAST EXCISIONAL BIOPSY Left 1999  . CESAREAN SECTION  1982/1983   2 times  . COLONOSCOPY  2008  . LAPAROSCOPY     abd/tubal ligation  . TUBAL LIGATION  1988    There were no vitals filed for this visit.   Subjective Assessment - 06/17/20 1108    Subjective No new changes with knee.  Would like help prioritizing exercises for HEP.  Her legs get really tired when she performs all the exercises.  The ITB stretch makes her leg very tired.    Pertinent History arthritis    Patient Stated Goals to be able to walk like a normoal person; to be able to walk up and down stairs in her house    Currently in Pain? No/denies    Pain Score 0-No pain              [facility] PT Assessment - 06/17/20 0001      Assessment   Medical Diagnosis Bilat knee pain     Referring Provider (PT) Dr [name]    Onset Date/Surgical Date 09/23/19    Hand Dominance Right;Left    Next MD Visit 07/06/20              [facility] Adult PT Treatment/Exercise - 06/17/20 0001      Self-Care   Self-Care Other Self-Care Comments    Other Self-Care Comments  reviewed self massage with roller stick to thigh; pt returned demo       Knee/Hip Exercises: Stretches   Passive Hamstring Stretch Right;Left;2 reps;20 seconds   seated with hip hinge    Quad Stretch Right;Left;20 seconds;2 reps   seated with foot under chair.    Hip Flexor Stretch Right;Left;1 rep   seated   ITB Stretch Limitations seated hamstring stretch with toes turned in, x 20 sec each leg for lateral thigh stretch.     Piriformis Stretch Limitations trial of modified pigeon pose on table x 30 sec each leg; very limited ROM in Lt hip rotators.     Gastroc Stretch Right;Left;2 reps;20 seconds   heel off of step     Knee/Hip Exercises: Aerobic   Nustep L3: 4 min (legs only)      Knee/Hip Exercises: Standing   Hip Abduction Stengthening;Right;Left;1 set;5 reps    Abduction Limitations cues to not  lift leg as high.     Forward Step Up Right;Left;1 set;5 reps    Functional Squat 1 set;5 reps   holding onto sink, hip hinge     Knee/Hip Exercises: Seated   Sit to Sand 1 set;5 reps;without UE support   arms out in front                 PT Education - 06/17/20 1515    Education Details HEP modified   (removed soleus, ITB, piriformis, and hip flexor stretches - added seated quad, hamstring, ITB stretch and trialed seated piriformis stretches)    Person(s) Educated Patient    Methods Explanation;Handout    Comprehension Verbalized understanding;Returned demonstration;Verbal cues required               PT Long Term Goals - 06/17/20 1114      PT LONG TERM GOAL #1   Title Decrease pain in bilat knees 50-75% for functional activities    Baseline 75% improvement since injection.    Time 6    Period Weeks    Status Achieved      PT LONG TERM GOAL #2   Title Increase AROM bilat knees 24 degrees in flexion and 5-7 deg in extension     Time 6    Period Weeks    Status On-going      PT LONG TERM GOAL #3   Title Patient reports ability to walk for 20-30 min and ascend/descend 10-12 steps with minimal to no increase in knee pain    Baseline can complete steps, but hasn't attempted walking longer than 15 min yet    Time 6    Period Weeks    Status Partially Met      PT LONG TERM GOAL #4   Title Independent in HEP    Time 6    Period Weeks    Status On-going      PT LONG TERM GOAL #5   Title Improve FOTO to </= 41% limitation    Time 6    Period Weeks    Status On-going                 Plan - 06/17/20 1508    Clinical Impression Statement Continued relief from injections in knees.  Pt shown alternatives to current stretches and HEP modified for improved compliance/comfort. Pt tolerated squats at sink better than stand to/from sit, and reported increased tightness in quads after seated hamstring stretch.  Tightness in LE decreased with self massage with roller stick to thighs.  Pt has met LTG#1 and partially met LTG#3.    Rehab Potential Good    PT Frequency 2x / week    PT Duration 6 weeks    PT Treatment/Interventions ADLs/Self Care Home Management;Aquatic Therapy;Cryotherapy;Electrical Stimulation;Iontophoresis 41m/ml Dexamethasone;Moist Heat;Ultrasound;Gait training;Stair training;Functional mobility training;Therapeutic activities;Therapeutic exercise;Balance training;Neuromuscular re-education;Manual techniques;Passive range of motion;Dry needling;Taping;Vasopneumatic Device    PT Next Visit Plan assess response to HEP changes; continue strengthening LE's;  body mechanics/transfers; manual as indicated.    PT Home Exercise Plan [email or web address]    Consulted and Agree with Plan of Care Patient           Patient will benefit from skilled therapeutic intervention in order to improve the following deficits and impairments:  Abnormal gait, Decreased range of motion, Difficulty walking, Decreased activity tolerance,  Pain, Decreased balance, Impaired flexibility, Improper body mechanics, Decreased mobility, Decreased strength, Increased edema, Postural dysfunction  Visit Diagnosis: Chronic pain of left knee  Chronic pain of right knee  Other abnormalities of gait and mobility  Muscle weakness (generalized)     Problem List Patient Active Problem List   Diagnosis Date Noted  . Primary osteoarthritis of both knees 05/27/2020  . Mixed hyperlipidemia 05/26/2020  . Perceived hearing changes 05/26/2020  . Left breast mass 01/14/2019  . Hypotension 01/14/2019  . Laceration of right lower extremity 01/14/2019  . H/O osteopenia 11/29/2017  . Strain of abdominal muscle 09/28/2017  . No energy 06/22/2017  . Trouble in sleeping 06/22/2017  . Atypical pigmented skin lesion 03/02/2016  . Post-menopausal 03/02/2016  . Abnormal weight gain 05/16/2014  . Hypertriglyceridemia 04/15/2014  . Obesity 04/15/2014  . Fibrocystic breast 03/25/2014  . Hyperlipidemia    Kerin Perna, PTA 06/17/20 3:19 PM  [place], [place], [address] Phone: [phone]   Fax:  [phone]  Name: Mikeisha Lemonds MRN: [id number] Date of Birth: 1951-02-26

## 2020-06-25 ENCOUNTER — Encounter: Payer: Medicare PPO | Admitting: Physical Therapy

## 2020-07-01 ENCOUNTER — Ambulatory Visit (INDEPENDENT_AMBULATORY_CARE_PROVIDER_SITE_OTHER): Payer: Medicare PPO | Admitting: Physical Therapy

## 2020-07-01 ENCOUNTER — Other Ambulatory Visit: Payer: Self-pay

## 2020-07-01 ENCOUNTER — Encounter: Payer: Self-pay | Admitting: Physical Therapy

## 2020-07-01 DIAGNOSIS — M25561 Pain in right knee: Secondary | ICD-10-CM

## 2020-07-01 DIAGNOSIS — M6281 Muscle weakness (generalized): Secondary | ICD-10-CM | POA: Diagnosis not present

## 2020-07-01 DIAGNOSIS — M25562 Pain in left knee: Secondary | ICD-10-CM

## 2020-07-01 DIAGNOSIS — R2689 Other abnormalities of gait and mobility: Secondary | ICD-10-CM

## 2020-07-01 DIAGNOSIS — G8929 Other chronic pain: Secondary | ICD-10-CM

## 2020-07-01 NOTE — Patient Instructions (Addendum)
  Access Code: [email or web address] URL: [email or web address] Date: 12/08/2021Prepared by: [place]  Prone Quadriceps Stretch with Strap - 2 x daily - 7 x weekly - 1 sets - 2 reps - 20 hold  Standing Gastroc Stretch - 2 x daily - 7 x weekly - 1 sets - 3 reps - 30 sec hold  Step Up - 2 x daily - 7 x weekly - 1 sets - 10 reps - 2 sec hold  Standing Hip Abduction with Counter Support - 2 x daily - 7 x weekly - 2-3 sets - 10 reps - 2-3 sec hold  Sit to Stand without Arm Support - 2 x daily - 7 x weekly - 1 sets - 10 reps - 3-5sec hold  Sidelying Hip Abduction - 2 x daily - 7 x weekly - 1 sets - 10 reps - 3-5 sec hold  Seated Hamstring Stretch - 2 x daily - 7 x weekly - 1 sets - 2-3 reps - 20 seconds hold  Supine Piriformis Stretch with Foot on Ground - 1 x daily - 7 x weekly - 3 sets - 2 reps

## 2020-07-01 NOTE — Therapy (Addendum)
[place], [place], [address] Phone: [phone]   Fax:  [phone]  Physical Therapy Treatment and Discharge  Patient Details  Name: Janice Dudley MRN: [id number] Date of Birth: 1950/11/02 Referring Provider (PT): Dr [name]   Encounter Date: 07/01/2020   PT End of Session - 07/01/20 0933    Visit Number 5    Number of Visits 12    Date for PT Re-Evaluation 07/14/20    Authorization Type [employer]    Authorization Time Period 06/02/20- 07/14/20    Authorization - Visit Number 5    Authorization - Number of Visits 12    Progress Note Due on Visit 10    PT Start Time 0930    PT Stop Time 1006    PT Time Calculation (min) 36 min    Activity Tolerance Patient tolerated treatment well    Behavior During Therapy [facility] for tasks assessed/performed           Past Medical History:  Diagnosis Date  . Cataract    no surgery yet  . Hyperlipidemia     Past Surgical History:  Procedure Laterality Date  . BREAST EXCISIONAL BIOPSY Left 1999  . CESAREAN SECTION  1982/1983   2 times  . COLONOSCOPY  2008  . LAPAROSCOPY     abd/tubal ligation  . TUBAL LIGATION  1988    There were no vitals filed for this visit.   Subjective Assessment - 07/01/20 0934    Subjective "I'm walking so much easier and going up stairs easier".  Pt reports she was able to walk 2 miles last week without difficulty; Rt hip started to be painful in the last 1/4 mile - "but it wasn't as bad as its been".    Patient Stated Goals to be able to walk like a normoal person; to be able to walk up and down stairs in her house    Currently in Pain? No/denies    Pain Score 0-No pain              [facility] PT Assessment - 07/01/20 0001      Assessment   Medical Diagnosis Bilat knee pain     Referring Provider (PT) Dr [name]    Onset Date/Surgical Date 09/23/19    Hand Dominance Right;Left    Next MD Visit  07/06/20      Observation/Other Assessments   Focus on Therapeutic Outcomes (FOTO)  37% limitation       AROM   Right Knee Extension -7    Right Knee Flexion 118    Left Knee Extension -6    Left Knee Flexion 118            [facility] Adult PT Treatment/Exercise - 07/01/20 0001      Knee/Hip Exercises: Stretches   Passive Hamstring Stretch Right;Left;2 reps;20 seconds   seated with hip hinge    Quad Stretch Right;Left;20 seconds;2 reps   seated with foot under chair.    Hip Flexor Stretch 20 seconds;Left;2 reps;Right;1 rep   seated   ITB Stretch Limitations --    Piriformis Stretch Right;Left;1 rep;30 seconds   hooklying with strap assist.    Gastroc Stretch Both;2 reps;20 seconds   heels off of step     Knee/Hip Exercises: Aerobic   Nustep L4: 4 min, legs only.       Knee/Hip Exercises: Seated   Sit to Sand 1 set;5 reps;without UE support   arms out in  front         Verbally reviewed remaining HEP exercises.     PT Long Term Goals - 07/01/20 0956      PT LONG TERM GOAL #1   Title Decrease pain in bilat knees 50-75% for functional activities    Baseline 75% improvement since injection.    Time 6    Period Weeks    Status Achieved      PT LONG TERM GOAL #2   Title Increase AROM bilat knees 24 degrees in flexion and 5-7 deg in extension    Baseline improvement of 9 deg in knee flexion.    Time 6    Period Weeks    Status On-going      PT LONG TERM GOAL #3   Title Patient reports ability to walk for 20-30 min and ascend/descend 10-12 steps with minimal to no increase in knee pain    Baseline --    Time 6    Period Weeks    Status Achieved      PT LONG TERM GOAL #4   Title Independent in HEP    Time 6    Period Weeks    Status Achieved      PT LONG TERM GOAL #5   Title Improve FOTO to </= 41% limitation    Time 6    Period Weeks    Status Achieved                 Plan - 07/01/20 1009    Clinical Impression Statement Pt demonstrated improved bilat  knee flexion ROM and improved functional strength with ability to control descent from stand to sit.  Pt has had positive response to modification of exercises; reviewed today and added piriformis stretch for hip tightness.  Pt has met most goals; only unmet goal is knee ROM.  Pt agreeable to hold therapy while she continues to work on [employer].    Rehab Potential Good    PT Frequency 2x / week    PT Duration 6 weeks    PT Treatment/Interventions ADLs/Self Care Home Management;Aquatic Therapy;Cryotherapy;Electrical Stimulation;Iontophoresis 61m/ml Dexamethasone;Moist Heat;Ultrasound;Gait training;Stair training;Functional mobility training;Therapeutic activities;Therapeutic exercise;Balance training;Neuromuscular re-education;Manual techniques;Passive range of motion;Dry needling;Taping;Vasopneumatic Device    PT Next Visit Plan spoke to supervising PT; will hold therapy until 08/01/20. If pt doesn't return, will d/c.    PT Home Exercise Plan [email or web address]    Consulted and Agree with Plan of Care Patient           Patient will benefit from skilled therapeutic intervention in order to improve the following deficits and impairments:  Abnormal gait, Decreased range of motion, Difficulty walking, Decreased activity tolerance, Pain, Decreased balance, Impaired flexibility, Improper body mechanics, Decreased mobility, Decreased strength, Increased edema, Postural dysfunction  Visit Diagnosis: Chronic pain of left knee  Chronic pain of right knee  Other abnormalities of gait and mobility  Muscle weakness (generalized)     Problem List Patient Active Problem List   Diagnosis Date Noted  . Primary osteoarthritis of both knees 05/27/2020  . Mixed hyperlipidemia 05/26/2020  . Perceived hearing changes 05/26/2020  . Left breast mass 01/14/2019  . Hypotension 01/14/2019  . Laceration of right lower extremity 01/14/2019  . H/O osteopenia 11/29/2017  . Strain of abdominal muscle 09/28/2017  . No energy  06/22/2017  . Trouble in sleeping 06/22/2017  . Atypical pigmented skin lesion 03/02/2016  . Post-menopausal 03/02/2016  . Abnormal weight gain 05/16/2014  . Hypertriglyceridemia 04/15/2014  . Obesity  04/15/2014  . Fibrocystic breast 03/25/2014  . Hyperlipidemia    PHYSICAL THERAPY DISCHARGE SUMMARY  Visits from Start of Care: 5  Current functional level related to goals / functional outcomes: Able to walk 2 miles with decreased pain   Remaining deficits: See above   Education / Equipment: HEP Plan: Patient agrees to discharge.  Patient goals were partially met. Patient is being discharged due to meeting the stated rehab goals.  ?????    Isabelle Course, PT   Kerin Perna, PTA 07/01/20 10:13 AM  [place], [place], [address] Phone: [phone]   Fax:  [phone]  Name: Janice Dudley MRN: [id number] Date of Birth: [birth year]

## 2020-07-03 ENCOUNTER — Other Ambulatory Visit: Payer: Self-pay | Admitting: Physician Assistant

## 2020-07-06 ENCOUNTER — Other Ambulatory Visit: Payer: Self-pay

## 2020-07-06 ENCOUNTER — Other Ambulatory Visit: Payer: Self-pay | Admitting: Physician Assistant

## 2020-07-06 ENCOUNTER — Ambulatory Visit (INDEPENDENT_AMBULATORY_CARE_PROVIDER_SITE_OTHER): Payer: Medicare PPO | Admitting: Sports Medicine

## 2020-07-06 DIAGNOSIS — M170 Bilateral primary osteoarthritis of knee: Secondary | ICD-10-CM | POA: Diagnosis not present

## 2020-07-06 NOTE — Progress Notes (Signed)
° ° °  Procedures performed today:    None.  Independent interpretation of notes and tests performed by another provider:   None.  Brief History, Exam, Impression, and Recommendations:    Primary osteoarthritis of both knees Janice Dudley returns, she is a very pleasant 69 year old female, after failure of conservative check to both of her knees and she returns today completely pain-free after a short steroid flush. Return to see me as needed.    ___________________________________________ [name]. [name], M.D., ABFM., CAQSM. Primary Care and [place] Instructor of [place] of [facility] of Medicine

## 2020-07-06 NOTE — Assessment & Plan Note (Signed)
Janice Dudley returns, she is a very pleasant 69 year old female, after failure of conservative check to both of her knees and she returns today completely pain-free after a short steroid flush. Return to see me as needed.

## 2020-07-07 LAB — COMPLETE METABOLIC PANEL WITH GFR
AG Ratio: 1.6 (calc) (ref 1.0–2.5)
ALT: 31 U/L — ABNORMAL HIGH (ref 6–29)
AST: 25 U/L (ref 10–35)
Albumin: 4.1 g/dL (ref 3.6–5.1)
Alkaline phosphatase (APISO): 73 U/L (ref 37–153)
BUN: 15 mg/dL (ref 7–25)
CO2: 29 mmol/L (ref 20–32)
Calcium: 9.9 mg/dL (ref 8.6–10.4)
Chloride: 101 mmol/L (ref 98–110)
Creat: 0.92 mg/dL (ref 0.50–0.99)
GFR, Est African American: 74 mL/min/{1.73_m2} (ref >=60)
GFR, Est Non African American: 64 mL/min/{1.73_m2} (ref >=60)
Globulin: 2.6 g/dL (calc) (ref 1.9–3.7)
Glucose, Bld: 88 mg/dL (ref 65–99)
Potassium: 4.2 mmol/L (ref 3.5–5.3)
Sodium: 136 mmol/L (ref 135–146)
Total Bilirubin: 0.5 mg/dL (ref 0.2–1.2)
Total Protein: 6.7 g/dL (ref 6.1–8.1)

## 2020-07-07 NOTE — Progress Notes (Signed)
Went down a bit more. Very close to normal range. Work on losing 10lbs and I think will normalize. Recheck in 3 months.

## 2020-09-18 ENCOUNTER — Other Ambulatory Visit: Payer: Self-pay | Admitting: Physician Assistant

## 2020-09-18 DIAGNOSIS — E6609 Other obesity due to excess calories: Secondary | ICD-10-CM

## 2020-11-10 ENCOUNTER — Other Ambulatory Visit: Payer: Self-pay | Admitting: Neurology

## 2020-11-10 DIAGNOSIS — Z6837 Body mass index (BMI) 37.0-37.9, adult: Secondary | ICD-10-CM

## 2020-11-10 DIAGNOSIS — E6609 Other obesity due to excess calories: Secondary | ICD-10-CM

## 2020-11-10 MED ORDER — BUPROPION HCL ER (SR) 100 MG PO TB12: 100.0000 mg | ORAL_TABLET | Freq: Two times a day (BID) | ORAL | 0 refills | Status: DC

## 2020-12-04 ENCOUNTER — Other Ambulatory Visit: Payer: Self-pay

## 2020-12-04 ENCOUNTER — Ambulatory Visit (INDEPENDENT_AMBULATORY_CARE_PROVIDER_SITE_OTHER): Payer: Medicare PPO | Admitting: Sports Medicine

## 2020-12-04 ENCOUNTER — Ambulatory Visit (INDEPENDENT_AMBULATORY_CARE_PROVIDER_SITE_OTHER): Payer: Medicare PPO

## 2020-12-04 DIAGNOSIS — M170 Bilateral primary osteoarthritis of knee: Secondary | ICD-10-CM

## 2020-12-04 NOTE — Assessment & Plan Note (Signed)
This is a very pleasant 70 year old female, she has known bilateral knee osteoarthritis, chronic process, she was injected back in November and did really well for almost 6 months. She is now having an exacerbation of her pain, as her injection was 6 months ago we can do it again. Bilateral steroid injections performed today, she did have a steroid flushing response after the last set of injections so I would recommend that she do 50 mg of Benadryl tonight. She can return to see me on an as-needed basis however if she does not get tremendous relief we can switch to viscosupplementation.

## 2020-12-04 NOTE — Progress Notes (Signed)
    Procedures performed today:    Procedure: Real-time Ultrasound Guidedinjection of the left knee Device: Samsung HS60  Verbal informed consent obtained.  Time-out conducted.  Noted no overlying erythema, induration, or other signs of local infection.  Skin prepped in a sterile fashion.  Local anesthesia: Topical Ethyl chloride.  With sterile technique and under real time ultrasound guidance: Noted moderate effusion 1 cc Kenalog 40, 2 cc lidocaine, 2 cc bupivacaine injected easily Completed without difficulty  Advised to call if fevers/chills, erythema, induration, drainage, or persistent bleeding.  Images permanently stored and available for review in PACS.  Impression: Technically successful ultrasound guided injection.  Procedure: Real-time Ultrasound Guidedinjection of the right knee Device: Samsung HS60  Verbal informed consent obtained.  Time-out conducted.  Noted no overlying erythema, induration, or other signs of local infection.  Skin prepped in a sterile fashion.  Local anesthesia: Topical Ethyl chloride.  With sterile technique and under real time ultrasound guidance: Noted moderate effusion 1 cc Kenalog 40, 2 cc lidocaine, 2 cc bupivacaine injected easily Completed without difficulty  Advised to call if fevers/chills, erythema, induration, drainage, or persistent bleeding.  Images permanently stored and available for review in PACS.  Impression: Technically successful ultrasound guided injection.  Independent interpretation of notes and tests performed by another provider:   None.  Brief History, Exam, Impression, and Recommendations:    Primary osteoarthritis of both knees This is a very pleasant 69 year old female, she has known bilateral knee osteoarthritis, chronic process, she was injected back in November and did really well for almost 6 months. She is now having an exacerbation of her pain, as her injection was 6 months ago we can do it  again. Bilateral steroid injections performed today, she did have a steroid flushing response after the last set of injections so I would recommend that she do 50 mg of Benadryl tonight. She can return to see me on an as-needed basis however if she does not get tremendous relief we can switch to viscosupplementation.    ___________________________________________ [name]. [name], M.D., ABFM., CAQSM. Primary Care and [place] Instructor of [place] of [facility] of Medicine

## 2020-12-14 ENCOUNTER — Other Ambulatory Visit: Payer: Self-pay | Admitting: Neurology

## 2020-12-14 DIAGNOSIS — Z6837 Body mass index (BMI) 37.0-37.9, adult: Secondary | ICD-10-CM

## 2020-12-14 DIAGNOSIS — E6609 Other obesity due to excess calories: Secondary | ICD-10-CM

## 2020-12-14 MED ORDER — BUPROPION HCL ER (SR) 100 MG PO TB12: 100.0000 mg | ORAL_TABLET | Freq: Two times a day (BID) | ORAL | 0 refills | Status: DC

## 2020-12-17 ENCOUNTER — Other Ambulatory Visit: Payer: Self-pay | Admitting: Obstetrics and Gynecology

## 2020-12-17 DIAGNOSIS — Z1231 Encounter for screening mammogram for malignant neoplasm of breast: Secondary | ICD-10-CM

## 2020-12-25 ENCOUNTER — Encounter: Payer: Self-pay | Admitting: Physician Assistant

## 2020-12-25 ENCOUNTER — Ambulatory Visit: Payer: Medicare PPO | Admitting: Physician Assistant

## 2020-12-25 ENCOUNTER — Other Ambulatory Visit: Payer: Self-pay

## 2020-12-25 VITALS — BP 126/68 | HR 71 | Ht 61.0 in | Wt 189.0 lb

## 2020-12-25 DIAGNOSIS — E782 Mixed hyperlipidemia: Secondary | ICD-10-CM

## 2020-12-25 DIAGNOSIS — E6609 Other obesity due to excess calories: Secondary | ICD-10-CM | POA: Diagnosis not present

## 2020-12-25 DIAGNOSIS — E781 Pure hyperglyceridemia: Secondary | ICD-10-CM

## 2020-12-25 DIAGNOSIS — R748 Abnormal levels of other serum enzymes: Secondary | ICD-10-CM | POA: Diagnosis not present

## 2020-12-25 DIAGNOSIS — L719 Rosacea, unspecified: Secondary | ICD-10-CM

## 2020-12-25 DIAGNOSIS — R600 Localized edema: Secondary | ICD-10-CM

## 2020-12-25 DIAGNOSIS — Z6835 Body mass index (BMI) 35.0-35.9, adult: Secondary | ICD-10-CM

## 2020-12-25 MED ORDER — METRONIDAZOLE 0.75 % EX LOTN: 1.0000 "application " | TOPICAL_LOTION | Freq: Two times a day (BID) | CUTANEOUS | 3 refills | Status: DC

## 2020-12-25 MED ORDER — HYDROCHLOROTHIAZIDE 12.5 MG PO TABS: 12.5000 mg | ORAL_TABLET | Freq: Every day | ORAL | 1 refills | Status: DC

## 2020-12-25 MED ORDER — BUPROPION HCL ER (SR) 150 MG PO TB12: 150.0000 mg | ORAL_TABLET | Freq: Two times a day (BID) | ORAL | 3 refills | Status: DC

## 2020-12-25 MED ORDER — ATORVASTATIN CALCIUM 40 MG PO TABS: 40.0000 mg | ORAL_TABLET | Freq: Every day | ORAL | 3 refills | Status: DC

## 2020-12-25 NOTE — Progress Notes (Signed)
Subjective:    Patient ID: Janice Dudley, female    DOB: [birth year], 70 y.o.   MRN: [id number]  HPI  Patient is a 70 year old obese female with elevated liver enzymes, hyperlipidemia, hypertriglyceridemia who presents to the clinic to follow-up on weight.  Patient is lost 8 pounds in the last few months.  She is on Wellbutrin SR twice a day.  She feels like this has been very effective at helping her with motivation and weight loss.  She would like a refill.  She denies any side effects such as headaches, increase in anxiety.  She is starting to noticed her face breaking out for the last few months.  Started with the use of mass years ago but seems to not be improving.  She finds little red bumps with blotchy appearance over her cheeks nose and forehead. Weight/break out   Elevated liver enzymes and need recheck. She has cut back on wine/tylenol.   .. Active Ambulatory Problems    Diagnosis Date Noted  . Hyperlipidemia   . Fibrocystic breast 03/25/2014  . Hypertriglyceridemia 04/15/2014  . Obesity 04/15/2014  . Abnormal weight gain 05/16/2014  . Atypical pigmented skin lesion 03/02/2016  . Post-menopausal 03/02/2016  . No energy 06/22/2017  . Trouble in sleeping 06/22/2017  . Strain of abdominal muscle 09/28/2017  . H/O osteopenia 11/29/2017  . Left breast mass 01/14/2019  . Hypotension 01/14/2019  . Laceration of right lower extremity 01/14/2019  . Mixed hyperlipidemia 05/26/2020  . Perceived hearing changes 05/26/2020  . Primary osteoarthritis of both knees 05/27/2020  . Elevated liver enzymes 12/28/2020   Resolved Ambulatory Problems    Diagnosis Date Noted  . Sprain of ankle 05/16/2014  . Obesity (BMI 30.0-34.9) 06/22/2017   Past Medical History:  Diagnosis Date  . Cataract        Review of Systems    see HPI.  Objective:   Physical Exam Vitals reviewed.  Constitutional:      Appearance: Normal appearance. She is obese.  HENT:     Head:  Normocephalic.  Cardiovascular:     Rate and Rhythm: Normal rate and regular rhythm.     Pulses: Normal pulses.     Heart sounds: Normal heart sounds.  Abdominal:     General: Abdomen is flat. Bowel sounds are normal.     Palpations: Abdomen is soft.     Tenderness: There is no abdominal tenderness. There is no guarding or rebound.  Skin:    General: Skin is warm.     Comments: Erythema, papules over nose, cheeks, forehead.   Neurological:     General: No focal deficit present.     Mental Status: She is alert and oriented to person, place, and time.  Psychiatric:        Mood and Affect: Mood normal.        Behavior: Behavior normal.           Assessment & Plan:  Marland KitchenMarland KitchenLorey was seen today for follow-up.  Diagnoses and all orders for this visit:  Elevated liver enzymes -     COMPLETE METABOLIC PANEL WITH GFR  Class 2 obesity due to excess calories without serious comorbidity with body mass index (BMI) of 35.0 to 35.9 in adult -     buPROPion (WELLBUTRIN SR) 150 MG 12 hr tablet; Take 1 tablet (150 mg total) by mouth 2 (two) times daily.  Mixed hyperlipidemia -     atorvastatin (LIPITOR) 40 MG tablet; Take 1 tablet (40 mg  total) by mouth daily.  Hypertriglyceridemia -     atorvastatin (LIPITOR) 40 MG tablet; Take 1 tablet (40 mg total) by mouth daily.  Rosacea -     METRONIDAZOLE, TOPICAL, 0.75 % LOTN; Apply 1 application topically in the morning and at bedtime.  Lower extremity edema -     hydrochlorothiazide (HYDRODIURIL) 12.5 MG tablet; Take 1 tablet (12.5 mg total) by mouth daily.   Recheck CMP today.  No active edema refilled HCTZ for as needed usage.  Refilled lipitor. UTD lipid panel.  Rosacea appearance on face. Try metronidazole cream and avoidance of triggers. Given HO.   Marland Kitchen.Discussed low carb diet with 1500 calories and 80g of protein.  Exercising at least 150 minutes a week.  My Fitness Pal could be a Microbiologist.  Refilled wellbutrin.  Goal 3 months  and 15lbs weight loss.

## 2020-12-25 NOTE — Patient Instructions (Signed)
Rosacea Rosacea is a long-term (chronic) condition that affects the skin of the face, including the cheeks, nose, forehead, and chin. This condition can also affect the eyes. Rosacea causes blood vessels near the surface of the skin to enlarge, which results in redness. What are the causes? The cause of this condition is not known. Certain triggers can make rosacea worse, including:  Hot baths.  Exercise.  Sunlight.  Very hot or cold temperatures.  Hot or spicy foods and drinks.  Drinking alcohol.  Stress.  Taking blood pressure medicine.  Long-term use of topical steroids on the face. What increases the risk? You are more likely to develop this condition if you:  Are older than 69 years of age.  Are a woman.  Have light-colored skin (light complexion).  Have a family history of rosacea. What are the signs or symptoms? Symptoms of this condition include:  Redness of the face.  Red bumps or pimples on the face.  A red, enlarged nose.  Blushing easily.  Red lines on the skin.  Irritated, burning, or itchy feeling in the eyes.  Swollen eyelids.  Drainage from the eyes.  Feeling like there is something in your eye.   How is this diagnosed? This condition is diagnosed with a medical history and physical exam. How is this treated? There is no cure for this condition, but treatment can help to control your symptoms. Your health care provider may recommend that you see a skin specialist (dermatologist). Treatment may include:  Medicines that are applied to the skin or taken by mouth (orally). This can include antibiotic medicines.  Laser treatment to improve the appearance of the skin.  Surgery. This is rare. Your health care provider will also recommend the best way to take care of your skin. Even after your skin improves, you will likely need to continue treatment to prevent your rosacea from coming back. Follow these instructions at home: Skin care Take  care of your skin as told by your health care provider. You may be told to do these things:  Wash your skin gently two or more times each day.  Use mild soap.  Use a sunscreen or sunblock with SPF 30 or greater.  Use gentle cosmetics that are meant for sensitive skin.  Shave with an electric shaver instead of a blade. Lifestyle  Try to keep track of what foods trigger this condition. Avoid any triggers. These may include: ? Spicy foods. ? Seafood. ? Cheese. ? Hot liquids. ? Nuts. ? Chocolate. ? Iodized salt.  Do not drink alcohol.  Avoid extremely cold or hot temperatures.  Try to reduce your stress. If you need help, talk with your health care provider.  When you exercise, do these things to stay cool: ? Limit sun exposure to your face. ? Use a fan. ? Do shorter and more frequent intervals of exercise. General instructions  Take and apply over-the-counter and prescription medicines only as told by your health care provider.  If you were prescribed an antibiotic medicine, apply it or take it as told by your health care provider. Do not stop using the antibiotic even if your condition improves.  If your eyelids are affected, apply warm compresses to them. Do this as told by your health care provider.  Keep all follow-up visits as told by your health care provider. This is important. Contact a health care provider if:  Your symptoms get worse.  Your symptoms do not improve after 2 months of treatment.  You  have new symptoms.  You have any changes in vision or you have problems with your eyes, such as redness or itching.  You feel depressed.  You lose your appetite.  You have trouble concentrating. Summary  Rosacea is a long-term (chronic) condition that affects the skin of the face, including the cheeks, nose, forehead, and chin.  Take care of your skin as told by your health care provider.  Take and apply over-the-counter and prescription medicines only as  told by your health care provider.  Contact a health care provider if your symptoms get worse or if you have any changes in vision or other problems with your eyes, such as redness or itching.  Keep all follow-up visits as told by your health care provider. This is important. This information is not intended to replace advice given to you by your health care provider. Make sure you discuss any questions you have with your health care provider. Document Revised: 12/13/2017 Document Reviewed: 12/13/2017 Elsevier Patient Education  2021 [employer].

## 2020-12-26 LAB — COMPLETE METABOLIC PANEL WITH GFR
AG Ratio: 1.9 (calc) (ref 1.0–2.5)
ALT: 17 U/L (ref 6–29)
AST: 19 U/L (ref 10–35)
Albumin: 4.2 g/dL (ref 3.6–5.1)
Alkaline phosphatase (APISO): 67 U/L (ref 37–153)
BUN: 15 mg/dL (ref 7–25)
CO2: 25 mmol/L (ref 20–32)
Calcium: 9.4 mg/dL (ref 8.6–10.4)
Chloride: 105 mmol/L (ref 98–110)
Creat: 0.79 mg/dL (ref 0.50–0.99)
GFR, Est African American: 89 mL/min/{1.73_m2} (ref >=60)
GFR, Est Non African American: 77 mL/min/{1.73_m2} (ref >=60)
Globulin: 2.2 g/dL (calc) (ref 1.9–3.7)
Glucose, Bld: 88 mg/dL (ref 65–99)
Potassium: 4 mmol/L (ref 3.5–5.3)
Sodium: 139 mmol/L (ref 135–146)
Total Bilirubin: 0.5 mg/dL (ref 0.2–1.2)
Total Protein: 6.4 g/dL (ref 6.1–8.1)

## 2020-12-28 ENCOUNTER — Encounter: Payer: Self-pay | Admitting: Physician Assistant

## 2020-12-28 DIAGNOSIS — R748 Abnormal levels of other serum enzymes: Secondary | ICD-10-CM | POA: Insufficient documentation

## 2020-12-28 NOTE — Progress Notes (Signed)
[name],   Liver enzymes look GREAT!

## 2021-02-02 DIAGNOSIS — Z87891 Personal history of nicotine dependence: Secondary | ICD-10-CM | POA: Diagnosis not present

## 2021-02-02 DIAGNOSIS — Z6834 Body mass index (BMI) 34.0-34.9, adult: Secondary | ICD-10-CM | POA: Diagnosis not present

## 2021-02-02 DIAGNOSIS — Z809 Family history of malignant neoplasm, unspecified: Secondary | ICD-10-CM | POA: Diagnosis not present

## 2021-02-02 DIAGNOSIS — Z79899 Other long term (current) drug therapy: Secondary | ICD-10-CM | POA: Diagnosis not present

## 2021-02-02 DIAGNOSIS — Z79818 Long term (current) use of other agents affecting estrogen receptors and estrogen levels: Secondary | ICD-10-CM | POA: Diagnosis not present

## 2021-02-02 DIAGNOSIS — Z7982 Long term (current) use of aspirin: Secondary | ICD-10-CM | POA: Diagnosis not present

## 2021-02-02 DIAGNOSIS — H35372 Puckering of macula, left eye: Secondary | ICD-10-CM | POA: Diagnosis not present

## 2021-02-02 DIAGNOSIS — E669 Obesity, unspecified: Secondary | ICD-10-CM | POA: Diagnosis not present

## 2021-02-02 DIAGNOSIS — H43822 Vitreomacular adhesion, left eye: Secondary | ICD-10-CM | POA: Diagnosis not present

## 2021-02-02 DIAGNOSIS — H3581 Retinal edema: Secondary | ICD-10-CM | POA: Diagnosis not present

## 2021-02-03 ENCOUNTER — Other Ambulatory Visit: Payer: Self-pay | Admitting: Physician Assistant

## 2021-02-03 DIAGNOSIS — Z6837 Body mass index (BMI) 37.0-37.9, adult: Secondary | ICD-10-CM

## 2021-02-03 DIAGNOSIS — E6609 Other obesity due to excess calories: Secondary | ICD-10-CM

## 2021-02-12 ENCOUNTER — Ambulatory Visit: Payer: Medicare PPO

## 2021-02-15 ENCOUNTER — Ambulatory Visit (INDEPENDENT_AMBULATORY_CARE_PROVIDER_SITE_OTHER): Payer: Medicare PPO

## 2021-02-15 ENCOUNTER — Other Ambulatory Visit: Payer: Self-pay

## 2021-02-15 ENCOUNTER — Ambulatory Visit: Payer: Medicare PPO | Admitting: Sports Medicine

## 2021-02-15 DIAGNOSIS — M25461 Effusion, right knee: Secondary | ICD-10-CM | POA: Diagnosis not present

## 2021-02-15 DIAGNOSIS — M170 Bilateral primary osteoarthritis of knee: Secondary | ICD-10-CM

## 2021-02-15 DIAGNOSIS — M25561 Pain in right knee: Secondary | ICD-10-CM | POA: Diagnosis not present

## 2021-02-15 NOTE — Progress Notes (Signed)
    Procedures performed today:    Procedure: Real-time Ultrasound Guided injection of the right knee Device: Samsung HS60  Verbal informed consent obtained.  Time-out conducted.  Noted no overlying erythema, induration, or other signs of local infection.  Skin prepped in a sterile fashion.  Local anesthesia: Topical Ethyl chloride.  With sterile technique and under real time ultrasound guidance: Noted trace effusion, 1 cc Kenalog 40, 2 cc lidocaine, 2 cc bupivacaine injected easily Completed without difficulty  Advised to call if fevers/chills, erythema, induration, drainage, or persistent bleeding.  Images permanently stored and available for review in PACS.  Impression: Technically successful ultrasound guided injection.  Independent interpretation of notes and tests performed by another provider:   None.  Brief History, Exam, Impression, and Recommendations:    Primary osteoarthritis of both knees Janice Dudley is a very pleasant 70 year old female, known bilateral knee osteoarthritis, 2 and half months ago we injected both of her knees and she did extremely well. Unfortunately she stepped in a hole and reinjured her right knee, no effusion, pain at the posterior/medial joint line. Repeat injection today, handicap placard written, updated x-rays today. Return to see me as needed. Viscosupplementation would be next, ultimate goal is to be able to walk around during a trip to [employer] [place] in January 2023, they are questioning whether knee replacement now would be a good idea, I think that would be cutting it too close with regards to recovery.    ___________________________________________ [name]. [name], M.D., ABFM., CAQSM. Primary Care and [place] [employer] of [place] [employer] [facility]

## 2021-02-15 NOTE — Assessment & Plan Note (Signed)
Janice Dudley is a very pleasant 70 year old female, known bilateral knee osteoarthritis, 2 and half months ago we injected both of her knees and she did extremely well. Unfortunately she stepped in a hole and reinjured her right knee, no effusion, pain at the posterior/medial joint line. Repeat injection today, handicap placard written, updated x-rays today. Return to see me as needed. Viscosupplementation would be next, ultimate goal is to be able to walk around during a trip to [place] in January 2023, they are questioning whether knee replacement now would be a good idea, I think that would be cutting it too close with regards to recovery.

## 2021-02-17 NOTE — Telephone Encounter (Signed)
That is quite confusing isnt it.  LOL, I will advise her just to return as needed.

## 2021-03-12 DIAGNOSIS — Z6834 Body mass index (BMI) 34.0-34.9, adult: Secondary | ICD-10-CM | POA: Diagnosis not present

## 2021-03-12 DIAGNOSIS — Z01419 Encounter for gynecological examination (general) (routine) without abnormal findings: Secondary | ICD-10-CM | POA: Diagnosis not present

## 2021-04-02 ENCOUNTER — Ambulatory Visit: Admission: RE | Admit: 2021-04-02 | Payer: Medicare PPO | Source: Ambulatory Visit

## 2021-04-02 ENCOUNTER — Other Ambulatory Visit: Payer: Self-pay | Admitting: Obstetrics and Gynecology

## 2021-04-02 ENCOUNTER — Other Ambulatory Visit: Payer: Self-pay

## 2021-04-02 DIAGNOSIS — N6320 Unspecified lump in the left breast, unspecified quadrant: Secondary | ICD-10-CM

## 2021-04-12 ENCOUNTER — Other Ambulatory Visit: Payer: Self-pay | Admitting: Obstetrics and Gynecology

## 2021-04-12 ENCOUNTER — Other Ambulatory Visit: Payer: Self-pay

## 2021-04-12 ENCOUNTER — Ambulatory Visit
Admission: RE | Admit: 2021-04-12 | Discharge: 2021-04-12 | Disposition: A | Discharge status: Home / Self Care | Payer: Medicare PPO | Source: Ambulatory Visit | Attending: Obstetrics and Gynecology | Admitting: Obstetrics and Gynecology

## 2021-04-12 DIAGNOSIS — N6320 Unspecified lump in the left breast, unspecified quadrant: Secondary | ICD-10-CM

## 2021-04-12 DIAGNOSIS — N6310 Unspecified lump in the right breast, unspecified quadrant: Secondary | ICD-10-CM

## 2021-04-12 DIAGNOSIS — R922 Inconclusive mammogram: Secondary | ICD-10-CM | POA: Diagnosis not present

## 2021-04-19 ENCOUNTER — Ambulatory Visit
Admission: RE | Admit: 2021-04-19 | Discharge: 2021-04-19 | Disposition: A | Discharge status: Home / Self Care | Payer: Medicare PPO | Source: Ambulatory Visit | Attending: Obstetrics and Gynecology | Admitting: Obstetrics and Gynecology

## 2021-04-19 ENCOUNTER — Other Ambulatory Visit: Payer: Self-pay

## 2021-04-19 DIAGNOSIS — C50511 Malignant neoplasm of lower-outer quadrant of right female breast: Secondary | ICD-10-CM | POA: Diagnosis not present

## 2021-04-19 DIAGNOSIS — N6313 Unspecified lump in the right breast, lower outer quadrant: Secondary | ICD-10-CM | POA: Diagnosis not present

## 2021-04-19 DIAGNOSIS — N6310 Unspecified lump in the right breast, unspecified quadrant: Secondary | ICD-10-CM

## 2021-04-20 ENCOUNTER — Other Ambulatory Visit: Payer: Self-pay | Admitting: Obstetrics and Gynecology

## 2021-04-20 DIAGNOSIS — C50912 Malignant neoplasm of unspecified site of left female breast: Secondary | ICD-10-CM

## 2021-04-21 ENCOUNTER — Telehealth: Payer: Self-pay | Admitting: Oncology

## 2021-04-21 NOTE — Telephone Encounter (Signed)
Spoke to patient to confirm afternoon clinic appointment for 10/5, will email packet

## 2021-04-23 ENCOUNTER — Encounter: Payer: Self-pay | Admitting: *Deleted

## 2021-04-23 DIAGNOSIS — Z170 Estrogen receptor positive status [ER+]: Secondary | ICD-10-CM

## 2021-04-23 DIAGNOSIS — C50511 Malignant neoplasm of lower-outer quadrant of right female breast: Secondary | ICD-10-CM | POA: Insufficient documentation

## 2021-04-26 DIAGNOSIS — C50919 Malignant neoplasm of unspecified site of unspecified female breast: Secondary | ICD-10-CM | POA: Insufficient documentation

## 2021-04-27 ENCOUNTER — Ambulatory Visit
Admission: RE | Admit: 2021-04-27 | Discharge: 2021-04-27 | Disposition: A | Discharge status: Home / Self Care | Payer: Medicare PPO | Source: Ambulatory Visit | Attending: Obstetrics and Gynecology | Admitting: Obstetrics and Gynecology

## 2021-04-27 ENCOUNTER — Other Ambulatory Visit: Payer: Self-pay

## 2021-04-27 DIAGNOSIS — C50912 Malignant neoplasm of unspecified site of left female breast: Secondary | ICD-10-CM

## 2021-04-27 DIAGNOSIS — N6323 Unspecified lump in the left breast, lower outer quadrant: Secondary | ICD-10-CM | POA: Diagnosis not present

## 2021-04-27 DIAGNOSIS — N6012 Diffuse cystic mastopathy of left breast: Secondary | ICD-10-CM | POA: Diagnosis not present

## 2021-04-28 ENCOUNTER — Inpatient Hospital Stay: Payer: Medicare PPO | Attending: Oncology

## 2021-04-28 ENCOUNTER — Encounter: Payer: Self-pay | Admitting: *Deleted

## 2021-04-28 ENCOUNTER — Inpatient Hospital Stay (HOSPITAL_BASED_OUTPATIENT_CLINIC_OR_DEPARTMENT_OTHER): Payer: Medicare PPO | Admitting: Oncology

## 2021-04-28 ENCOUNTER — Ambulatory Visit
Admission: RE | Admit: 2021-04-28 | Discharge: 2021-04-28 | Disposition: A | Discharge status: Home / Self Care | Payer: Medicare PPO | Source: Ambulatory Visit | Attending: Radiation Oncology | Admitting: Radiation Oncology

## 2021-04-28 ENCOUNTER — Other Ambulatory Visit: Payer: Self-pay | Admitting: Surgery

## 2021-04-28 ENCOUNTER — Encounter: Payer: Self-pay | Admitting: Oncology

## 2021-04-28 ENCOUNTER — Ambulatory Visit (HOSPITAL_BASED_OUTPATIENT_CLINIC_OR_DEPARTMENT_OTHER): Payer: Medicare PPO | Admitting: Genetic Counselor

## 2021-04-28 VITALS — BP 114/62 | HR 70 | Temp 97.9°F | Resp 18 | Ht 61.0 in | Wt 183.2 lb

## 2021-04-28 DIAGNOSIS — Z641 Problems related to multiparity: Secondary | ICD-10-CM | POA: Insufficient documentation

## 2021-04-28 DIAGNOSIS — N6082 Other benign mammary dysplasias of left breast: Secondary | ICD-10-CM | POA: Insufficient documentation

## 2021-04-28 DIAGNOSIS — Z803 Family history of malignant neoplasm of breast: Secondary | ICD-10-CM | POA: Diagnosis not present

## 2021-04-28 DIAGNOSIS — C50511 Malignant neoplasm of lower-outer quadrant of right female breast: Secondary | ICD-10-CM | POA: Insufficient documentation

## 2021-04-28 DIAGNOSIS — Z8049 Family history of malignant neoplasm of other genital organs: Secondary | ICD-10-CM

## 2021-04-28 DIAGNOSIS — E785 Hyperlipidemia, unspecified: Secondary | ICD-10-CM | POA: Insufficient documentation

## 2021-04-28 DIAGNOSIS — Z8042 Family history of malignant neoplasm of prostate: Secondary | ICD-10-CM

## 2021-04-28 DIAGNOSIS — N6489 Other specified disorders of breast: Secondary | ICD-10-CM | POA: Diagnosis not present

## 2021-04-28 DIAGNOSIS — C50911 Malignant neoplasm of unspecified site of right female breast: Secondary | ICD-10-CM | POA: Diagnosis not present

## 2021-04-28 DIAGNOSIS — Z79899 Other long term (current) drug therapy: Secondary | ICD-10-CM | POA: Diagnosis not present

## 2021-04-28 DIAGNOSIS — Z8249 Family history of ischemic heart disease and other diseases of the circulatory system: Secondary | ICD-10-CM | POA: Insufficient documentation

## 2021-04-28 DIAGNOSIS — N6323 Unspecified lump in the left breast, lower outer quadrant: Secondary | ICD-10-CM | POA: Diagnosis not present

## 2021-04-28 DIAGNOSIS — Z818 Family history of other mental and behavioral disorders: Secondary | ICD-10-CM | POA: Diagnosis not present

## 2021-04-28 DIAGNOSIS — Z882 Allergy status to sulfonamides status: Secondary | ICD-10-CM | POA: Insufficient documentation

## 2021-04-28 DIAGNOSIS — Z8349 Family history of other endocrine, nutritional and metabolic diseases: Secondary | ICD-10-CM | POA: Diagnosis not present

## 2021-04-28 DIAGNOSIS — Z87891 Personal history of nicotine dependence: Secondary | ICD-10-CM | POA: Insufficient documentation

## 2021-04-28 DIAGNOSIS — Z823 Family history of stroke: Secondary | ICD-10-CM | POA: Diagnosis not present

## 2021-04-28 DIAGNOSIS — Z170 Estrogen receptor positive status [ER+]: Secondary | ICD-10-CM

## 2021-04-28 DIAGNOSIS — Z853 Personal history of malignant neoplasm of breast: Secondary | ICD-10-CM

## 2021-04-28 DIAGNOSIS — Z808 Family history of malignant neoplasm of other organs or systems: Secondary | ICD-10-CM | POA: Insufficient documentation

## 2021-04-28 DIAGNOSIS — Z801 Family history of malignant neoplasm of trachea, bronchus and lung: Secondary | ICD-10-CM | POA: Insufficient documentation

## 2021-04-28 LAB — CBC WITH DIFFERENTIAL (CANCER CENTER ONLY)
Abs Immature Granulocytes: 0.01 10*3/uL (ref 0.00–0.07)
Basophils Absolute: 0.1 10*3/uL (ref 0.0–0.1)
Basophils Relative: 1 %
Eosinophils Absolute: 0.2 10*3/uL (ref 0.0–0.5)
Eosinophils Relative: 3 %
HCT: 37.3 % (ref 36.0–46.0)
Hemoglobin: 12.6 g/dL (ref 12.0–15.0)
Immature Granulocytes: 0 %
Lymphocytes Relative: 18 %
Lymphs Abs: 1.2 10*3/uL (ref 0.7–4.0)
MCH: 31.5 pg (ref 26.0–34.0)
MCHC: 33.8 g/dL (ref 30.0–36.0)
MCV: 93.3 fL (ref 80.0–100.0)
Monocytes Absolute: 0.6 10*3/uL (ref 0.1–1.0)
Monocytes Relative: 10 %
Neutro Abs: 4.4 10*3/uL (ref 1.7–7.7)
Neutrophils Relative %: 68 %
Platelet Count: 291 10*3/uL (ref 150–400)
RBC: 4 MIL/uL (ref 3.87–5.11)
RDW: 13.1 % (ref 11.5–15.5)
WBC Count: 6.4 10*3/uL (ref 4.0–10.5)
nRBC: 0 % (ref 0.0–0.2)

## 2021-04-28 LAB — CMP (CANCER CENTER ONLY)
ALT: 16 U/L (ref 0–44)
AST: 20 U/L (ref 15–41)
Albumin: 4.1 g/dL (ref 3.5–5.0)
Alkaline Phosphatase: 101 U/L (ref 38–126)
Anion gap: 10 (ref 5–15)
BUN: 13 mg/dL (ref 8–23)
CO2: 26 mmol/L (ref 22–32)
Calcium: 9.9 mg/dL (ref 8.9–10.3)
Chloride: 99 mmol/L (ref 98–111)
Creatinine: 0.9 mg/dL (ref 0.44–1.00)
GFR, Estimated: >60 mL/min (ref >60)
Glucose, Bld: 96 mg/dL (ref 70–99)
Potassium: 4.1 mmol/L (ref 3.5–5.1)
Sodium: 135 mmol/L (ref 135–145)
Total Bilirubin: 0.6 mg/dL (ref 0.3–1.2)
Total Protein: 7.1 g/dL (ref 6.5–8.1)

## 2021-04-28 LAB — GENETIC SCREENING ORDER

## 2021-04-28 NOTE — Progress Notes (Signed)
[facility]  [phone] [phone]     ID: Janice Dudley DOB: 10-24-1950  MR#: [id number]  INO#:[id number]  Patient Care Team: Janice Dudley as PCP - General (Family Medicine) Janice Kaufmann, RN as Oncology Nurse Navigator Janice Germany, RN as Oncology Nurse Navigator Janice Keens, MD as Consulting Physician (General Surgery) Janice Dudley, Janice Dad, MD as Consulting Physician (Oncology) Janice Rudd, MD as Consulting Physician (Radiation Oncology) Janice Pearson, MD as Consulting Physician (Obstetrics and Gynecology) Janice Decamp, MD as Consulting Physician (Sports Medicine) Janice Libman, MD as Referring Physician (Specialist) Janice Cruel, MD OTHER MD:  CHIEF COMPLAINT: Estrogen receptor positive breast cancer  CURRENT TREATMENT: Awaiting definitive surgery   HISTORY OF CURRENT ILLNESS: Janice Dudley "Janice Dudley" was under every-six-month follow up for a likely-benign left breast mass. She underwent bilateral diagnostic mammography with tomography and bilateral breast ultrasonography at [facility] on 04/12/2021 showing: breast density category B; new indeterminate 7 mm mass in right breast at 7 o'clock; no evidence of right axillary adenopathy; the likely-benign left breast mass at 4 o'clock is stable.  Accordingly on 04/19/2021 she proceeded to biopsy of the right breast area in question. The pathology from this procedure ([id number]) showed: invasive ductal carcinoma, grade 2/3. Prognostic indicators significant for: estrogen receptor, >95% positive with strong staining intensity and progesterone receptor, 40% positive with moderate staining intensity. Proliferation marker Ki67 at 15%. HER2 equivocal by immunohistochemistry (2+), but negative by fluorescent in situ hybridization with a signals ratio 2.02 and 1.89 and number per cell 4.55 and 4.15}.  She also underwent biopsy of the left  breast mass on 04/27/2021. Pathology ([id number]) was benign, showing fibrocystic changes including apocrine metaplasia.  Cancer Staging Malignant neoplasm of lower-outer quadrant of right breast of female, estrogen receptor positive ([name]) Staging form: Breast, AJCC 8th Edition - Clinical stage from 04/28/2021: Stage IA (cT1b, cN0, cM0, G3, ER+, PR+, HER2-) - Signed by Janice Cruel, MD on 04/28/2021 Stage prefix: Initial diagnosis Histologic grading system: 3 grade system  The patient's subsequent history is as detailed below.   INTERVAL HISTORY: Janice Dudley "Janice Dudley" was evaluated in the multidisciplinary breast cancer clinic on 04/28/2021 accompanied by her husband Janice Dudley. Her case was also presented at the multidisciplinary breast cancer conference on the same day. At that time a preliminary plan was proposed: Breast conserving surgery with sentinel lymph node sampling, Oncotype, adjuvant radiation, antiestrogens and genetics testing   REVIEW OF SYSTEMS: On the provided questionnaire, Janice Dudley reports knee pain, new blurred vision, hearing loss (does not wear hearing aids), back pain, arthritis, and headaches. The patient denies nausea, vomiting, stiff neck, dizziness, or gait imbalance. There has been no cough, phlegm production, or pleurisy, no chest pain or pressure, and no change in bowel or bladder habits. The patient denies fever, rash, bleeding, unexplained fatigue or unexplained weight loss. A detailed review of systems was otherwise entirely negative.   COVID 19 VACCINATION STATUS: Pfizer x3 as of October 2022   PAST MEDICAL HISTORY: Past Medical History:  Diagnosis Date   Allergy 1960s, 1970s, 2000   Arthritis    Breast cancer ([place])    Cataract    no surgery yet   Hyperlipidemia     PAST SURGICAL HISTORY: Past Surgical History:  Procedure Laterality Date   BREAST EXCISIONAL BIOPSY Left 1999   BREAST SURGERY  1999   Lumpectomy   CESAREAN SECTION  1982/1983   2 times    COLONOSCOPY  2008  EYE SURGERY  01/2021   Vitrectomy   LAPAROSCOPY     abd/tubal ligation   TUBAL LIGATION  1988   VITRECTOMY      FAMILY HISTORY: Family History  Problem Relation Age of Onset   Stroke Mother 22   Heart attack Mother 26   Cancer Mother 51       Uterine   Breast cancer Mother 82   Heart attack Father 46   Cancer Father 73       Lung   Stroke Father 26   Cancer Sister 24       Breast and Uterine    Hyperlipidemia Sister    Breast cancer Sister 58   Thyroid nodules Brother    Hyperlipidemia Brother    Thyroid cancer Brother    Lung cancer Brother    Prostate cancer Brother    Cancer Brother    Hyperlipidemia Sister    Hyperlipidemia Brother    Cancer Brother    Hyperlipidemia Brother    Anxiety disorder Janice    Depression Janice    Colon cancer Neg Hx    Esophageal cancer Neg Hx    Rectal cancer Neg Hx    Her father died at age 66 from [facility]. He had a history of lung cancer at age 78. Her mother died at age 46 from uterine cancer. She had a history of breast cancer at age 37 and apparently was treated with tamoxifen. Janice Dudley has three brothers and three sisters. One sister had breast and uterine cancer at age 82 (possibly also treated with tamoxifen), a brother had lung cancer at age 104, and another brother had prostate cancer at age 24.  There is no history of colon cancer in the family   GYNECOLOGIC HISTORY:  No LMP recorded. Patient is postmenopausal. Menarche: 70 years old Age at first live birth: 70 years old [place] P 2 LMP 2006 Contraceptive: used for 12 years HRT: never used  Hysterectomy? no BSO? no   SOCIAL HISTORY: (updated 04/2021)  [name] "Janice Dudley" is currently retired from working as a Pharmacist, hospital for pre-K to [employer]. Husband Janice Dudley is a retired Government social research officer. 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 Janice Dudley has three grandchildren. She attends a [employer] in  [place].    ADVANCED DIRECTIVES: in place   HEALTH MAINTENANCE: Social History   Tobacco Use   Smoking status: Former    Packs/day: 0.25    Years: 20.00    Pack years: 5.00    Types: Cigarettes    Quit date: 05/09/1989    Years since quitting: 31.9   Smokeless tobacco: Never  Vaping Use   Vaping Use: Never used  Substance Use Topics   Alcohol use: Yes    Alcohol/week: 2.0 - 3.0 standard drinks    Types: 2 - 3 Glasses of wine per week    Comment: wine    Drug use: No     Colonoscopy: 04/2018 (Dr. Ardis Hughs), recall 2029  PAP: 2021  Bone density: 2021   Allergies  Allergen Reactions   Peach [Prunus Persica]     Hives/itching   Sulfa Antibiotics Hives   Topamax [Topiramate]     Bilateral leg pain   Shellfish Allergy Hives    Current Outpatient Medications  Medication Sig Dispense Refill   aspirin 81 MG tablet Take 81 mg by mouth daily.     atorvastatin (LIPITOR) 40 MG tablet Take  1 tablet (40 mg total) by mouth daily. 90 tablet 3   b complex vitamins tablet Take 1 tablet by mouth daily.     buPROPion (WELLBUTRIN SR) 150 MG 12 hr tablet Take 1 tablet (150 mg total) by mouth 2 (two) times daily. 180 tablet 3   cholecalciferol (VITAMIN D) 1000 UNITS tablet Take 1,000 Units by mouth daily.     hydrochlorothiazide (HYDRODIURIL) 12.5 MG tablet Take 1 tablet (12.5 mg total) by mouth daily. 90 tablet 1   MELATONIN PO Take 10 mg by mouth at bedtime.      traZODone (DESYREL) 50 MG tablet TAKE 1 TO 2 TABLETS BY MOUTH EVERY NIGHT 1 HOUR BEFORE BEDTIME AS NEEDED. 180 tablet 3   albuterol (VENTOLIN HFA) 108 (90 Base) MCG/ACT inhaler INHALE 2 PUFFS INTO THE LUNGS EVERY 6 HOURS AS NEEDED FOR WHEEZING OR SHORTNESS OF BREATH 25.5 g 0   calcium carbonate (TUMS EX) 750 MG chewable tablet Chew 1 tablet by mouth daily.     cyclopentolate (CYCLODRYL,CYCLOGYL) 1 % ophthalmic solution 1 drop once. As directed.     erythromycin ophthalmic ointment 1 application at bedtime. As directed      KRILL OIL PO Take by mouth.     Magnesium 250 MG TABS Take by mouth daily.      METRONIDAZOLE, TOPICAL, 0.75 % LOTN Apply 1 application topically in the morning and at bedtime. 59 mL 3   No current facility-administered medications for this visit.    OBJECTIVE: White woman who appears stated age  62:   04/28/21 1315  BP: 114/62  Pulse: 70  Resp: 18  Temp: 97.9 F (36.6 C)  SpO2: 100%     Body mass index is 34.62 kg/m.   Wt Readings from Last 3 Encounters:  04/28/21 183 lb 3.2 oz (83.1 kg)  12/25/20 189 lb (85.7 kg)  05/26/20 197 lb (89.4 kg)      ECOG FS:1 - Symptomatic but completely ambulatory  Ocular: Sclerae unicteric, pupils round and equal Ear-nose-throat: Wearing a mask Lymphatic: No cervical or supraclavicular adenopathy Lungs no rales or rhonchi Heart regular rate and rhythm Abd soft, nontender, positive bowel sounds MSK no focal spinal tenderness, no joint edema Neuro: non-focal, well-oriented, appropriate affect Breasts: The right breast is status post recent biopsy.  There is a moderate ecchymosis.  I do not palpate a mass.  The left breast is also status post recent biopsy.  There are no skin or nipple changes of concern on either side.  Both axillae are benign   LAB RESULTS:  CMP     Component Value Date/Time   NA 135 04/28/2021 1231   K 4.1 04/28/2021 1231   CL 99 04/28/2021 1231   CO2 26 04/28/2021 1231   GLUCOSE 96 04/28/2021 1231   BUN 13 04/28/2021 1231   CREATININE 0.90 04/28/2021 1231   CREATININE 0.79 12/25/2020 1000   CALCIUM 9.9 04/28/2021 1231   PROT 7.1 04/28/2021 1231   ALBUMIN 4.1 04/28/2021 1231   AST 20 04/28/2021 1231   ALT 16 04/28/2021 1231   ALKPHOS 101 04/28/2021 1231   BILITOT 0.6 04/28/2021 1231   GFRNONAA >60 04/28/2021 1231   GFRNONAA 77 12/25/2020 1000   GFRAA 89 12/25/2020 1000    No results found for: TOTALPROTELP, ALBUMINELP, A1GS, A2GS, BETS, BETA2SER, GAMS, MSPIKE, SPEI  Lab Results  Component Value  Date   WBC 6.4 04/28/2021   NEUTROABS 4.4 04/28/2021   HGB 12.6 04/28/2021   HCT 37.3 04/28/2021  MCV 93.3 04/28/2021   PLT 291 04/28/2021    No results found for: LABCA2  No components found for: RJJOAC166  No results for input(s): INR in the last 168 hours.  No results found for: LABCA2  No results found for: AYT016  No results found for: WFU932  No results found for: TFT732  No results found for: CA2729  No components found for: HGQUANT  No results found for: CEA1 / No results found for: CEA1   No results found for: AFPTUMOR  No results found for: CHROMOGRNA  No results found for: KPAFRELGTCHN, LAMBDASER, KAPLAMBRATIO (kappa/lambda light chains)  No results found for: HGBA, HGBA2QUANT, HGBFQUANT, HGBSQUAN (Hemoglobinopathy evaluation)   No results found for: LDH  No results found for: IRON, TIBC, IRONPCTSAT (Iron and TIBC)  No results found for: FERRITIN  Urinalysis    Component Value Date/Time   BILIRUBINUR negative 03/28/2017 1622   KETONESUR negative 03/20/2014 1447   PROTEINUR negative 03/28/2017 1622   UROBILINOGEN 0.2 03/28/2017 1622   NITRITE positive 03/28/2017 1622   LEUKOCYTESUR Trace (A) 03/28/2017 1622     STUDIES: US BREAST LTD UNI LEFT INC AXILLA  Result Date: 04/12/2021 CLINICAL DATA:  70 year old female presenting for follow-up of a likely benign left breast mass. The patient has family history of breast cancer in her mother at age 33 and her sister at age 94. EXAM: DIGITAL DIAGNOSTIC BILATERAL MAMMOGRAM WITH TOMOSYNTHESIS AND CAD; ULTRASOUND LEFT BREAST LIMITED; ULTRASOUND RIGHT BREAST LIMITED TECHNIQUE: Bilateral digital diagnostic mammography and breast tomosynthesis was performed. The images were evaluated with computer-aided detection.; Targeted ultrasound examination of the left breast was performed.; Targeted ultrasound examination of the right breast was performed COMPARISON:  Previous exam(s). ACR Breast Density Category b:  There are scattered areas of fibroglandular density. FINDINGS: The mass in the slightly lower outer left breast appears mammographically stable. There is a new asymmetry in the central right breast, mid to anterior depth, which on spot compression tomosynthesis imaging appears to be associated with some distortion and few calcifications. No other suspicious calcifications, masses or areas of distortion are seen in the bilateral breasts. Ultrasound targeted to the right breast at 7 o'clock, 1 cm from the nipple demonstrates a bilobed irregular hypoechoic mass measuring 7 mm in an oblique plane. A benign cyst is incidentally noted at 7 o'clock, 5 cm from the nipple measuring 7 mm. Ultrasound of the right axilla demonstrates multiple normal-appearing lymph nodes. Ultrasound targeted to the left breast at 4 o'clock, 2 cm from the nipple demonstrates a stable oval hypoechoic mass measuring 6 x 4 x 6 mm, previously measuring 6 x 4 x 5 mm. IMPRESSION: 1. There is a new indeterminate mass in the right breast at 7 o'clock. 2.  No evidence of right axillary lymphadenopathy. 3.  The likely benign left breast mass at 4 o'clock is stable. RECOMMENDATION: 1. Ultrasound guided biopsy is recommended for the right breast mass at 7 o'clock. This has been scheduled for 04/19/2021 at 12:45 p.m. 2. If this right breast mass is malignant, ultrasound-guided biopsy of the likely benign left breast mass at 4 o'clock will be recommended prior to surgery. I have discussed the findings and recommendations with the patient. If applicable, a reminder letter will be sent to the patient regarding the next appointment. BI-RADS CATEGORY  4: Suspicious. Electronically Signed   By: Ammie Ferrier M.D.   On: 04/12/2021 11:26  US BREAST LTD UNI RIGHT INC AXILLA  Result Date: 04/12/2021 CLINICAL DATA:  70 year old female presenting  for follow-up of a likely benign left breast mass. The patient has family history of breast cancer in her mother at  age 79 and her sister at age 73. EXAM: DIGITAL DIAGNOSTIC BILATERAL MAMMOGRAM WITH TOMOSYNTHESIS AND CAD; ULTRASOUND LEFT BREAST LIMITED; ULTRASOUND RIGHT BREAST LIMITED TECHNIQUE: Bilateral digital diagnostic mammography and breast tomosynthesis was performed. The images were evaluated with computer-aided detection.; Targeted ultrasound examination of the left breast was performed.; Targeted ultrasound examination of the right breast was performed COMPARISON:  Previous exam(s). ACR Breast Density Category b: There are scattered areas of fibroglandular density. FINDINGS: The mass in the slightly lower outer left breast appears mammographically stable. There is a new asymmetry in the central right breast, mid to anterior depth, which on spot compression tomosynthesis imaging appears to be associated with some distortion and few calcifications. No other suspicious calcifications, masses or areas of distortion are seen in the bilateral breasts. Ultrasound targeted to the right breast at 7 o'clock, 1 cm from the nipple demonstrates a bilobed irregular hypoechoic mass measuring 7 mm in an oblique plane. A benign cyst is incidentally noted at 7 o'clock, 5 cm from the nipple measuring 7 mm. Ultrasound of the right axilla demonstrates multiple normal-appearing lymph nodes. Ultrasound targeted to the left breast at 4 o'clock, 2 cm from the nipple demonstrates a stable oval hypoechoic mass measuring 6 x 4 x 6 mm, previously measuring 6 x 4 x 5 mm. IMPRESSION: 1. There is a new indeterminate mass in the right breast at 7 o'clock. 2.  No evidence of right axillary lymphadenopathy. 3.  The likely benign left breast mass at 4 o'clock is stable. RECOMMENDATION: 1. Ultrasound guided biopsy is recommended for the right breast mass at 7 o'clock. This has been scheduled for 04/19/2021 at 12:45 p.m. 2. If this right breast mass is malignant, ultrasound-guided biopsy of the likely benign left breast mass at 4 o'clock will be recommended  prior to surgery. I have discussed the findings and recommendations with the patient. If applicable, a reminder letter will be sent to the patient regarding the next appointment. BI-RADS CATEGORY  4: Suspicious. Electronically Signed   By: Ammie Ferrier M.D.   On: 04/12/2021 11:26  MM DIAG BREAST TOMO BILATERAL  Result Date: 04/12/2021 CLINICAL DATA:  70 year old female presenting for follow-up of a likely benign left breast mass. The patient has family history of breast cancer in her mother at age 62 and her sister at age 56. EXAM: DIGITAL DIAGNOSTIC BILATERAL MAMMOGRAM WITH TOMOSYNTHESIS AND CAD; ULTRASOUND LEFT BREAST LIMITED; ULTRASOUND RIGHT BREAST LIMITED TECHNIQUE: Bilateral digital diagnostic mammography and breast tomosynthesis was performed. The images were evaluated with computer-aided detection.; Targeted ultrasound examination of the left breast was performed.; Targeted ultrasound examination of the right breast was performed COMPARISON:  Previous exam(s). ACR Breast Density Category b: There are scattered areas of fibroglandular density. FINDINGS: The mass in the slightly lower outer left breast appears mammographically stable. There is a new asymmetry in the central right breast, mid to anterior depth, which on spot compression tomosynthesis imaging appears to be associated with some distortion and few calcifications. No other suspicious calcifications, masses or areas of distortion are seen in the bilateral breasts. Ultrasound targeted to the right breast at 7 o'clock, 1 cm from the nipple demonstrates a bilobed irregular hypoechoic mass measuring 7 mm in an oblique plane. A benign cyst is incidentally noted at 7 o'clock, 5 cm from the nipple measuring 7 mm. Ultrasound of the right axilla demonstrates multiple normal-appearing lymph  nodes. Ultrasound targeted to the left breast at 4 o'clock, 2 cm from the nipple demonstrates a stable oval hypoechoic mass measuring 6 x 4 x 6 mm, previously  measuring 6 x 4 x 5 mm. IMPRESSION: 1. There is a new indeterminate mass in the right breast at 7 o'clock. 2.  No evidence of right axillary lymphadenopathy. 3.  The likely benign left breast mass at 4 o'clock is stable. RECOMMENDATION: 1. Ultrasound guided biopsy is recommended for the right breast mass at 7 o'clock. This has been scheduled for 04/19/2021 at 12:45 p.m. 2. If this right breast mass is malignant, ultrasound-guided biopsy of the likely benign left breast mass at 4 o'clock will be recommended prior to surgery. I have discussed the findings and recommendations with the patient. If applicable, a reminder letter will be sent to the patient regarding the next appointment. BI-RADS CATEGORY  4: Suspicious. Electronically Signed   By: Ammie Ferrier M.D.   On: 04/12/2021 11:26  MM CLIP PLACEMENT LEFT  Result Date: 04/27/2021 CLINICAL DATA:  Status post ultrasound-guided core biopsy of LEFT breast mass. EXAM: 3D DIAGNOSTIC LEFT MAMMOGRAM POST ULTRASOUND BIOPSY COMPARISON:  Previous exam(s). FINDINGS: 3D Mammographic images were obtained following ultrasound guided biopsy of mass in the 4 o'clock location of the LEFT breast and placement a ribbon shaped clip. The biopsy marking clip is in expected position at the site of biopsy. IMPRESSION: Appropriate positioning of the ribbon shaped biopsy marking clip at the site of biopsy in the LOWER OUTER QUADRANT of the LEFT breast. Final Assessment: Post Procedure Mammograms for Marker Placement Electronically Signed   By: Nolon Nations M.D.   On: 04/27/2021 12:18  MM CLIP PLACEMENT RIGHT  Result Date: 04/19/2021 CLINICAL DATA:  Confirmation of clip placement after ultrasound-guided core needle biopsy of a mass involving the LOWER OUTER QUADRANT of the RIGHT breast. EXAM: 2D and 3D DIAGNOSTIC RIGHT MAMMOGRAM POST ULTRASOUND BIOPSY COMPARISON:  Previous exam(s). FINDINGS: Tomosynthesis and synthesized full field CC and mediolateral images were obtained  following ultrasound guided biopsy of a mass involving the LOWER OUTER QUADRANT of the RIGHT breast. The ribbon shaped tissue marking clip is appropriately positioned approximately 7 mm lateral to the biopsied mass. Post biopsy changes are present within the mass. Expected post biopsy changes are present without evidence hematoma. IMPRESSION: Appropriate positioning of the ribbon shaped biopsy marking clip at the site of the biopsied mass in the LOWER OUTER QUADRANT of the RIGHT breast. Final Assessment: Post Procedure Mammograms for Marker Placement Electronically Signed   By: Evangeline Dakin M.D.   On: 04/19/2021 13:46  [place] LT BREAST BX W LOC DEV 1ST LESION IMG BX SPEC US GUIDE  Addendum Date: 04/28/2021   ADDENDUM REPORT: 04/28/2021 12:23 ADDENDUM: Pathology revealed FIBROCYSTIC CHANGES INCLUDING APOCRINE METAPLASIA of the LEFT breast, lower outer, 4 o'clock, (ribbon clip). This was found to be concordant by Dr. Nolon Nations. Pathology results were discussed with the patient by telephone. The patient reported doing well after the biopsy with tenderness at the site. Post biopsy instructions and care were reviewed and questions were answered. The patient was encouraged to call [facility] for any additional concerns. My direct phone number was provided. The patient has a recent diagnosis of RIGHT breast cancer and was referred to [facility] on April 28, 2021. Pathology results reported by Terie Purser, RN on 04/28/2021. Electronically Signed   By: Nolon Nations M.D.  On: 04/28/2021 12:23   Result Date: 04/28/2021 CLINICAL DATA:  Patient presents for ultrasound-guided core biopsy of LEFT breast mass. Recent biopsy of the RIGHT breast shows invasive ductal carcinoma in the 7 o'clock location. EXAM: ULTRASOUND GUIDED LEFT BREAST CORE NEEDLE BIOPSY COMPARISON:  Previous exam(s). PROCEDURE: I met with  the patient and we discussed the procedure of ultrasound-guided biopsy, including benefits and alternatives. We discussed the high likelihood of a successful procedure. We discussed the risks of the procedure, including infection, bleeding, tissue injury, clip migration, and inadequate sampling. Informed written consent was given. The usual time-out protocol was performed immediately prior to the procedure. Lesion quadrant: LOWER OUTER QUADRANT LEFT breast Using sterile technique and 1% Lidocaine as local anesthetic, under direct ultrasound visualization, a 12 gauge spring-loaded device was used to perform biopsy of mass in the 4 o'clock location of the LEFT breast using a LATERAL to MEDIAL approach. At the conclusion of the procedure ribbon shaped tissue marker clip was deployed into the biopsy cavity. Follow up 2 view mammogram was performed and dictated separately. IMPRESSION: Ultrasound guided biopsy of LEFT breast mass. No apparent complications. Electronically Signed: By: Nolon Nations M.D. On: 04/27/2021 12:17   [place] RT BREAST BX W LOC DEV 1ST LESION IMG BX SPEC US GUIDE  Addendum Date: 04/20/2021   ADDENDUM REPORT: 04/20/2021 15:12 ADDENDUM: Pathology revealed GRADE II-III INVASIVE DUCTAL CARCINOMA of the RIGHT breast, 7 o'clock 1 cmfn, lower outer quadrant, (ribbon clip). This was found to be concordant by Dr. Peggye Fothergill. Pathology results were discussed with the patient by telephone. The patient reported doing well after the biopsy with tenderness at the site. Post biopsy instructions and care were reviewed and questions were answered. The patient was encouraged to call [facility] for any additional concerns. My direct phone number was provided. The patient was referred to [facility] on April 28, 2021. The patient is scheduled for a LEFT breast ultrasound guided biopsy on April 27, 2021.  Further recommendations will be guided by the results of this biopsy. Pathology results reported by Terie Purser, RN on 04/20/2021. Electronically Signed   By: Evangeline Dakin M.D.   On: 04/20/2021 15:12   Result Date: 04/20/2021 CLINICAL DATA:  Mammographically detected indeterminate new 7 mm mass with subtle distortion involving the LOWER OUTER QUADRANT of the RIGHT breast at 7 o'clock 1 cm from nipple. Strong family history of breast cancer in her mother at age 63 and in a sister at age 47. EXAM: ULTRASOUND GUIDED RIGHT BREAST CORE NEEDLE BIOPSY COMPARISON:  Previous exam(s). PROCEDURE: I met with the patient and we discussed the procedure of ultrasound-guided biopsy, including benefits and alternatives. We discussed the high likelihood of a successful procedure. We discussed the risks of the procedure, including infection, bleeding, tissue injury, clip migration, and inadequate sampling. Informed written consent was given. The usual time-out protocol was performed immediately prior to the procedure. Lesion quadrant: LOWER OUTER QUADRANT. Using sterile technique with chlorhexidine as skin antisepsis, 1% Lidocaine as local anesthetic, under direct ultrasound visualization, a 12 gauge Bard Marquee core needle device placed through an 11 gauge introducer needle was used to perform biopsy of the mass in the LOWER OUTER QUADRANT of the RIGHT breast using an inferolateral approach. At the conclusion of the procedure a ribbon shaped tissue marker clip was deployed into the biopsy cavity. Follow up 2 view mammogram was performed and dictated separately. IMPRESSION: Ultrasound  guided biopsy of an indeterminate 7 mm mass involving the LOWER OUTER QUADRANT of the RIGHT breast. No apparent complications. Electronically Signed: By: Evangeline Dakin M.D. On: 04/19/2021 13:47    ELIGIBLE FOR AVAILABLE RESEARCH PROTOCOL: no  ASSESSMENT: 70 y.o. [place], [place] woman status post right breast lower outer quadrant biopsy  04/19/2021 for a clinical T1b N0, stage IA invasive ductal carcinoma, grade 2/3, estrogen and progesterone receptor strongly positive, HER2 not amplified, with an MIB-1 of 15%  (1) genetics testing  (2) definitive surgery pending  (3) Oncotype to be obtained from the definitive surgical suckable  (4) adjuvant radiation  (5) antiestrogens  PLAN: I met today with Janice Dudley to review her new diagnosis. Specifically we discussed the biology of her breast cancer, its diagnosis, staging, treatment  options and prognosis. We first reviewed the fact that cancer is not one disease but more than 100 different diseases and that it is important to keep them separate-- otherwise when friends and relatives discuss their own cancer experiences with Janice Dudley confusion can result. Similarly we explained that if breast cancer spreads to the bone or liver, the patient would not have bone cancer or liver cancer, but breast cancer in the bone and breast cancer in the liver: one cancer in three places-- not 3 different cancers which otherwise would have to be treated in 3 different ways.  We discussed the difference between local and systemic therapy. In terms of loco-regional treatment, lumpectomy plus radiation is equivalent to mastectomy as far as survival is concerned. For this reason, and because the cosmetic results are generally superior, we recommend breast conserving surgery.   We then discussed the rationale for systemic therapy. There is some risk that this cancer may have already spread to other parts of her body. Patients frequently ask at this point about bone scans, CAT scans and PET scans to find out if they have occult breast cancer somewhere else. The problem is that in early stage disease we are much more likely to find false positives then true cancers and this would expose the patient to unnecessary procedures as well as unnecessary radiation. Scans cannot answer the question the patient really would like  to know, which is whether she has microscopic disease elsewhere in her body. For those reasons we do not recommend them.  Of course we would proceed to aggressive evaluation of any symptoms that might suggest metastatic disease, but that is not the case here.  Next we went over the options for systemic therapy which are anti-estrogens, anti-HER-2 immunotherapy, and chemotherapy. Janice Dudley does not meet criteria for anti-HER-2 immunotherapy. She is a good candidate for anti-estrogens.  The question of chemotherapy is more complicated. Chemotherapy is most effective in rapidly growing, aggressive tumors. It is much less effective in low-grade, slow growing cancers. Janice Dudley's tumor is intermediate.  For that reason we are going to request an Oncotype from the definitive surgical sample, as suggested by NCCN guidelines.  She is aware those results, which will become available about 2 weeks postop, will determine whether or not she needs chemotherapy  Janice Dudley also meets criteria for genetics testing. In patients who carry a deleterious mutation [for example in a  BRCA gene], the risk of a new breast cancer developing in the future may be sufficiently great that the patient may choose bilateral mastectomies. However if she wishes to keep her breasts in that situation it is safe to do so. That would require intensified screening, which generally means not only yearly mammography but  a yearly breast MRI as well.   Lacheryl has a good understanding of the overall plan. She agrees with it. She knows the goal of treatment in her case is cure. She will call with any problems that may develop before her next visit here.  Total encounter time 65 minutes.Sarajane Jews C. Estephan Gallardo, MD 04/28/2021 3:40 PM Medical Oncology and Hematology [facility] [place], [place] [address] Tel. [phone]    Fax. [phone]   This document serves as a record of services personally performed by Lurline Del, MD. It was created on his behalf by Wilburn Mylar, a trained medical scribe. The creation of this record is based on the scribe's personal observations and the provider's statements to them.   I, Lurline Del MD, have reviewed the above documentation for accuracy and completeness, and I agree with the above.    *Total Encounter Time as defined by the Centers for Medicare and Medicaid Services includes, in addition to the face-to-face time of a patient visit (documented in the note above) non-face-to-face time: obtaining and reviewing outside history, ordering and reviewing medications, tests or procedures, care coordination (communications with other health care professionals or caregivers) and documentation in the medical record.

## 2021-04-28 NOTE — Progress Notes (Signed)
Radiation Oncology         [phone]) [phone] ________________________________  Name: Janice Dudley        MRN: [id number]  Date of Service: 04/28/2021 DOB: 26-Nov-1950  WN:IOEVOJJK, Royetta Car, PA-C  Mcarthur Rossetti*     REFERRING PHYSICIAN: Mcarthur Rossetti*   DIAGNOSIS: The encounter diagnosis was Malignant neoplasm of lower-outer quadrant of right breast of female, estrogen receptor positive ([place]).   HISTORY OF PRESENT ILLNESS: Janice Dudley is a 70 y.o. female seen in the multidisciplinary breast clinic for a new diagnosis of right breast cancer. The patient was noted to have a diagnostic mammogram for follow up of a mass in the left breast. This persisted and is in the 4:00 position measuring 6 mm. She will have a biopsy of this soon, but incidentally in the right breast a mass was seen in the 7:00 position measuring 7 mm. Biopsy of this right sided finding on 04/19/21 showed a grade 2 invasive ductal carcinoma htat was ER/PR positive, HER2 negative, Ki-67 was 15%. A biopsy of the left breast was benign. She's seen today to discuss treatment recommendations of her cancer.    PREVIOUS RADIATION THERAPY: No   PAST MEDICAL HISTORY:  Past Medical History:  Diagnosis Date   Cataract    no surgery yet   Hyperlipidemia        PAST SURGICAL HISTORY: Past Surgical History:  Procedure Laterality Date   BREAST EXCISIONAL BIOPSY Left 1999   CESAREAN SECTION  1982/1983   2 times   COLONOSCOPY  2008   LAPAROSCOPY     abd/tubal ligation   TUBAL LIGATION  1988     FAMILY HISTORY:  Family History  Problem Relation Age of Onset   Stroke Mother 58   Heart attack Mother 60   Cancer Mother 58       Uterine   Breast cancer Mother 63   Heart attack Father 41   Cancer Father 7       Lung   Stroke Father 51   Cancer Sister 42       Breast and Uterine    Hyperlipidemia Sister    Breast cancer Sister 64   Thyroid nodules Brother    Hyperlipidemia  Brother    Thyroid cancer Brother    Lung cancer Brother    Prostate cancer Brother    Hyperlipidemia Sister    Hyperlipidemia Brother    Hyperlipidemia Brother    Colon cancer Neg Hx    Esophageal cancer Neg Hx    Rectal cancer Neg Hx      SOCIAL HISTORY:  reports that she quit smoking about 31 years ago. Her smoking use included cigarettes. She has a 5.00 pack-year smoking history. She has never used smokeless tobacco. She reports current alcohol use of about 7.0 standard drinks per week. She reports that she does not use drugs. The patient is married and lives in [place]. 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 She has two adult children and young grandchildren. Her 52 year old granddaughter and daughter live with she and her husband. She's looking forward to a trip to [place] in January 2023.  ALLERGIES: Peach [prunus persica], Sulfa antibiotics, Topamax [topiramate], and Shellfish allergy   MEDICATIONS:  Current Outpatient Medications  Medication Sig Dispense Refill   albuterol (VENTOLIN HFA) 108 (90 Base) MCG/ACT inhaler INHALE 2 PUFFS INTO THE LUNGS EVERY 6 HOURS AS NEEDED FOR WHEEZING OR SHORTNESS OF  BREATH 25.5 g 0   aspirin 81 MG tablet Take 81 mg by mouth daily.     atorvastatin (LIPITOR) 40 MG tablet Take 1 tablet (40 mg total) by mouth daily. 90 tablet 3   b complex vitamins tablet Take 1 tablet by mouth daily.     buPROPion (WELLBUTRIN SR) 150 MG 12 hr tablet Take 1 tablet (150 mg total) by mouth 2 (two) times daily. 180 tablet 3   calcium carbonate (TUMS EX) 750 MG chewable tablet Chew 1 tablet by mouth daily.     cholecalciferol (VITAMIN D) 1000 UNITS tablet Take 1,000 Units by mouth daily.     cyclopentolate (CYCLODRYL,CYCLOGYL) 1 % ophthalmic solution 1 drop once. As directed.     erythromycin ophthalmic ointment 1 application at bedtime. As directed     hydrochlorothiazide (HYDRODIURIL) 12.5 MG tablet Take 1 tablet (12.5 mg  total) by mouth daily. 90 tablet 1   KRILL OIL PO Take by mouth.     Magnesium 250 MG TABS Take by mouth daily.      MELATONIN PO Take 10 mg by mouth at bedtime.      METRONIDAZOLE, TOPICAL, 0.75 % LOTN Apply 1 application topically in the morning and at bedtime. 59 mL 3   traZODone (DESYREL) 50 MG tablet TAKE 1 TO 2 TABLETS BY MOUTH EVERY NIGHT 1 HOUR BEFORE BEDTIME AS NEEDED. 180 tablet 3   No current facility-administered medications for this encounter.     REVIEW OF SYSTEMS: On review of systems, the patient reports that she is doing well overall. She feels a little soreness since the biopsy. No other complaints are verbalized.   PHYSICAL EXAM:  Wt Readings from Last 3 Encounters:  12/25/20 189 lb (85.7 kg)  05/26/20 197 lb (89.4 kg)  03/26/20 185 lb (83.9 kg)   Temp Readings from Last 3 Encounters:  03/26/20 98.5 F (36.9 C) (Oral)  07/02/19 97.7 F (36.5 C) (Oral)  01/09/19 97.6 F (36.4 C) (Oral)   BP Readings from Last 3 Encounters:  12/25/20 126/68  05/26/20 (!) 106/58  07/02/19 (!) 111/58   Pulse Readings from Last 3 Encounters:  12/25/20 71  05/26/20 70  07/02/19 84    In general this is a well appearing caucasian female in no acute distress. She's alert and oriented x4 and appropriate throughout the examination. Cardiopulmonary assessment is negative for acute distress and she exhibits normal effort. Bilateral breast exam is deferred.    ECOG = 1  0 - Asymptomatic (Fully active, able to carry on all predisease activities without restriction)  1 - Symptomatic but completely ambulatory (Restricted in physically strenuous activity but ambulatory and able to carry out work of a light or sedentary nature. For example, light housework, office work)  2 - Symptomatic, <50% in bed during the day (Ambulatory and capable of all self care but unable to carry out any work activities. Up and about more than 50% of waking hours)  3 - Symptomatic, >50% in bed, but not  bedbound (Capable of only limited self-care, confined to bed or chair 50% or more of waking hours)  4 - Bedbound (Completely disabled. Cannot carry on any self-care. Totally confined to bed or chair)  5 - Death   Eustace Pen MM, Creech RH, Tormey DC, et al. [id number]). "Toxicity and response criteria of the [facility]". [place] Oncol. 5 (6): 649-55    LABORATORY DATA:  Lab Results  Component Value Date   WBC 5.2 06/22/2017  HGB 13.6 06/22/2017   HCT 39.7 06/22/2017   MCV 94.1 06/22/2017   PLT 288 06/22/2017   Lab Results  Component Value Date   NA 139 12/25/2020   K 4.0 12/25/2020   CL 105 12/25/2020   CO2 25 12/25/2020   Lab Results  Component Value Date   ALT 17 12/25/2020   AST 19 12/25/2020   ALKPHOS 65 03/02/2016   BILITOT 0.5 12/25/2020      RADIOGRAPHY: US BREAST LTD UNI LEFT INC AXILLA  Result Date: 04/12/2021 CLINICAL DATA:  70 year old female presenting for follow-up of a likely benign left breast mass. The patient has family history of breast cancer in her mother at age 50 and her sister at age 43. EXAM: DIGITAL DIAGNOSTIC BILATERAL MAMMOGRAM WITH TOMOSYNTHESIS AND CAD; ULTRASOUND LEFT BREAST LIMITED; ULTRASOUND RIGHT BREAST LIMITED TECHNIQUE: Bilateral digital diagnostic mammography and breast tomosynthesis was performed. The images were evaluated with computer-aided detection.; Targeted ultrasound examination of the left breast was performed.; Targeted ultrasound examination of the right breast was performed COMPARISON:  Previous exam(s). ACR Breast Density Category b: There are scattered areas of fibroglandular density. FINDINGS: The mass in the slightly lower outer left breast appears mammographically stable. There is a new asymmetry in the central right breast, mid to anterior depth, which on spot compression tomosynthesis imaging appears to be associated with some distortion and few calcifications. No other suspicious calcifications, masses or  areas of distortion are seen in the bilateral breasts. Ultrasound targeted to the right breast at 7 o'clock, 1 cm from the nipple demonstrates a bilobed irregular hypoechoic mass measuring 7 mm in an oblique plane. A benign cyst is incidentally noted at 7 o'clock, 5 cm from the nipple measuring 7 mm. Ultrasound of the right axilla demonstrates multiple normal-appearing lymph nodes. Ultrasound targeted to the left breast at 4 o'clock, 2 cm from the nipple demonstrates a stable oval hypoechoic mass measuring 6 x 4 x 6 mm, previously measuring 6 x 4 x 5 mm. IMPRESSION: 1. There is a new indeterminate mass in the right breast at 7 o'clock. 2.  No evidence of right axillary lymphadenopathy. 3.  The likely benign left breast mass at 4 o'clock is stable. RECOMMENDATION: 1. Ultrasound guided biopsy is recommended for the right breast mass at 7 o'clock. This has been scheduled for 04/19/2021 at 12:45 p.m. 2. If this right breast mass is malignant, ultrasound-guided biopsy of the likely benign left breast mass at 4 o'clock will be recommended prior to surgery. I have discussed the findings and recommendations with the patient. If applicable, a reminder letter will be sent to the patient regarding the next appointment. BI-RADS CATEGORY  4: Suspicious. Electronically Signed   By: Ammie Ferrier M.D.   On: 04/12/2021 11:26  US BREAST LTD UNI RIGHT INC AXILLA  Result Date: 04/12/2021 CLINICAL DATA:  70 year old female presenting for follow-up of a likely benign left breast mass. The patient has family history of breast cancer in her mother at age 43 and her sister at age 35. EXAM: DIGITAL DIAGNOSTIC BILATERAL MAMMOGRAM WITH TOMOSYNTHESIS AND CAD; ULTRASOUND LEFT BREAST LIMITED; ULTRASOUND RIGHT BREAST LIMITED TECHNIQUE: Bilateral digital diagnostic mammography and breast tomosynthesis was performed. The images were evaluated with computer-aided detection.; Targeted ultrasound examination of the left breast was performed.;  Targeted ultrasound examination of the right breast was performed COMPARISON:  Previous exam(s). ACR Breast Density Category b: There are scattered areas of fibroglandular density. FINDINGS: The mass in the slightly lower outer left breast appears mammographically  stable. There is a new asymmetry in the central right breast, mid to anterior depth, which on spot compression tomosynthesis imaging appears to be associated with some distortion and few calcifications. No other suspicious calcifications, masses or areas of distortion are seen in the bilateral breasts. Ultrasound targeted to the right breast at 7 o'clock, 1 cm from the nipple demonstrates a bilobed irregular hypoechoic mass measuring 7 mm in an oblique plane. A benign cyst is incidentally noted at 7 o'clock, 5 cm from the nipple measuring 7 mm. Ultrasound of the right axilla demonstrates multiple normal-appearing lymph nodes. Ultrasound targeted to the left breast at 4 o'clock, 2 cm from the nipple demonstrates a stable oval hypoechoic mass measuring 6 x 4 x 6 mm, previously measuring 6 x 4 x 5 mm. IMPRESSION: 1. There is a new indeterminate mass in the right breast at 7 o'clock. 2.  No evidence of right axillary lymphadenopathy. 3.  The likely benign left breast mass at 4 o'clock is stable. RECOMMENDATION: 1. Ultrasound guided biopsy is recommended for the right breast mass at 7 o'clock. This has been scheduled for 04/19/2021 at 12:45 p.m. 2. If this right breast mass is malignant, ultrasound-guided biopsy of the likely benign left breast mass at 4 o'clock will be recommended prior to surgery. I have discussed the findings and recommendations with the patient. If applicable, a reminder letter will be sent to the patient regarding the next appointment. BI-RADS CATEGORY  4: Suspicious. Electronically Signed   By: Ammie Ferrier M.D.   On: 04/12/2021 11:26  MM DIAG BREAST TOMO BILATERAL  Result Date: 04/12/2021 CLINICAL DATA:  70 year old female  presenting for follow-up of a likely benign left breast mass. The patient has family history of breast cancer in her mother at age 74 and her sister at age 53. EXAM: DIGITAL DIAGNOSTIC BILATERAL MAMMOGRAM WITH TOMOSYNTHESIS AND CAD; ULTRASOUND LEFT BREAST LIMITED; ULTRASOUND RIGHT BREAST LIMITED TECHNIQUE: Bilateral digital diagnostic mammography and breast tomosynthesis was performed. The images were evaluated with computer-aided detection.; Targeted ultrasound examination of the left breast was performed.; Targeted ultrasound examination of the right breast was performed COMPARISON:  Previous exam(s). ACR Breast Density Category b: There are scattered areas of fibroglandular density. FINDINGS: The mass in the slightly lower outer left breast appears mammographically stable. There is a new asymmetry in the central right breast, mid to anterior depth, which on spot compression tomosynthesis imaging appears to be associated with some distortion and few calcifications. No other suspicious calcifications, masses or areas of distortion are seen in the bilateral breasts. Ultrasound targeted to the right breast at 7 o'clock, 1 cm from the nipple demonstrates a bilobed irregular hypoechoic mass measuring 7 mm in an oblique plane. A benign cyst is incidentally noted at 7 o'clock, 5 cm from the nipple measuring 7 mm. Ultrasound of the right axilla demonstrates multiple normal-appearing lymph nodes. Ultrasound targeted to the left breast at 4 o'clock, 2 cm from the nipple demonstrates a stable oval hypoechoic mass measuring 6 x 4 x 6 mm, previously measuring 6 x 4 x 5 mm. IMPRESSION: 1. There is a new indeterminate mass in the right breast at 7 o'clock. 2.  No evidence of right axillary lymphadenopathy. 3.  The likely benign left breast mass at 4 o'clock is stable. RECOMMENDATION: 1. Ultrasound guided biopsy is recommended for the right breast mass at 7 o'clock. This has been scheduled for 04/19/2021 at 12:45 p.m. 2. If this  right breast mass is malignant, ultrasound-guided biopsy of the likely  benign left breast mass at 4 o'clock will be recommended prior to surgery. I have discussed the findings and recommendations with the patient. If applicable, a reminder letter will be sent to the patient regarding the next appointment. BI-RADS CATEGORY  4: Suspicious. Electronically Signed   By: Ammie Ferrier M.D.   On: 04/12/2021 11:26  MM CLIP PLACEMENT LEFT  Result Date: 04/27/2021 CLINICAL DATA:  Status post ultrasound-guided core biopsy of LEFT breast mass. EXAM: 3D DIAGNOSTIC LEFT MAMMOGRAM POST ULTRASOUND BIOPSY COMPARISON:  Previous exam(s). FINDINGS: 3D Mammographic images were obtained following ultrasound guided biopsy of mass in the 4 o'clock location of the LEFT breast and placement a ribbon shaped clip. The biopsy marking clip is in expected position at the site of biopsy. IMPRESSION: Appropriate positioning of the ribbon shaped biopsy marking clip at the site of biopsy in the LOWER OUTER QUADRANT of the LEFT breast. Final Assessment: Post Procedure Mammograms for Marker Placement Electronically Signed   By: Nolon Nations M.D.   On: 04/27/2021 12:18  MM CLIP PLACEMENT RIGHT  Result Date: 04/19/2021 CLINICAL DATA:  Confirmation of clip placement after ultrasound-guided core needle biopsy of a mass involving the LOWER OUTER QUADRANT of the RIGHT breast. EXAM: 2D and 3D DIAGNOSTIC RIGHT MAMMOGRAM POST ULTRASOUND BIOPSY COMPARISON:  Previous exam(s). FINDINGS: Tomosynthesis and synthesized full field CC and mediolateral images were obtained following ultrasound guided biopsy of a mass involving the LOWER OUTER QUADRANT of the RIGHT breast. The ribbon shaped tissue marking clip is appropriately positioned approximately 7 mm lateral to the biopsied mass. Post biopsy changes are present within the mass. Expected post biopsy changes are present without evidence hematoma. IMPRESSION: Appropriate positioning of the ribbon  shaped biopsy marking clip at the site of the biopsied mass in the LOWER OUTER QUADRANT of the RIGHT breast. Final Assessment: Post Procedure Mammograms for Marker Placement Electronically Signed   By: Evangeline Dakin M.D.   On: 04/19/2021 13:46  [place] LT BREAST BX W LOC DEV 1ST LESION IMG BX SPEC US GUIDE  Result Date: 04/27/2021 CLINICAL DATA:  Patient presents for ultrasound-guided core biopsy of LEFT breast mass. Recent biopsy of the RIGHT breast shows invasive ductal carcinoma in the 7 o'clock location. EXAM: ULTRASOUND GUIDED LEFT BREAST CORE NEEDLE BIOPSY COMPARISON:  Previous exam(s). PROCEDURE: I met with the patient and we discussed the procedure of ultrasound-guided biopsy, including benefits and alternatives. We discussed the high likelihood of a successful procedure. We discussed the risks of the procedure, including infection, bleeding, tissue injury, clip migration, and inadequate sampling. Informed written consent was given. The usual time-out protocol was performed immediately prior to the procedure. Lesion quadrant: LOWER OUTER QUADRANT LEFT breast Using sterile technique and 1% Lidocaine as local anesthetic, under direct ultrasound visualization, a 12 gauge spring-loaded device was used to perform biopsy of mass in the 4 o'clock location of the LEFT breast using a LATERAL to MEDIAL approach. At the conclusion of the procedure ribbon shaped tissue marker clip was deployed into the biopsy cavity. Follow up 2 view mammogram was performed and dictated separately. IMPRESSION: Ultrasound guided biopsy of LEFT breast mass. No apparent complications. Electronically Signed   By: Nolon Nations M.D.   On: 04/27/2021 12:17  [place] RT BREAST BX W LOC DEV 1ST LESION IMG BX SPEC US GUIDE  Addendum Date: 04/20/2021   ADDENDUM REPORT: 04/20/2021 15:12 ADDENDUM: Pathology revealed GRADE II-III INVASIVE DUCTAL CARCINOMA of the RIGHT breast, 7 o'clock 1 cmfn, lower outer quadrant, (ribbon clip). This was  found  to be concordant by Dr. Peggye Fothergill. Pathology results were discussed with the patient by telephone. The patient reported doing well after the biopsy with tenderness at the site. Post biopsy instructions and care were reviewed and questions were answered. The patient was encouraged to call [facility] for any additional concerns. My direct phone number was provided. The patient was referred to [facility] on April 28, 2021. The patient is scheduled for a LEFT breast ultrasound guided biopsy on April 27, 2021. Further recommendations will be guided by the results of this biopsy. Pathology results reported by Terie Purser, RN on 04/20/2021. Electronically Signed   By: Evangeline Dakin M.D.   On: 04/20/2021 15:12   Result Date: 04/20/2021 CLINICAL DATA:  Mammographically detected indeterminate new 7 mm mass with subtle distortion involving the LOWER OUTER QUADRANT of the RIGHT breast at 7 o'clock 1 cm from nipple. Strong family history of breast cancer in her mother at age 43 and in a sister at age 25. EXAM: ULTRASOUND GUIDED RIGHT BREAST CORE NEEDLE BIOPSY COMPARISON:  Previous exam(s). PROCEDURE: I met with the patient and we discussed the procedure of ultrasound-guided biopsy, including benefits and alternatives. We discussed the high likelihood of a successful procedure. We discussed the risks of the procedure, including infection, bleeding, tissue injury, clip migration, and inadequate sampling. Informed written consent was given. The usual time-out protocol was performed immediately prior to the procedure. Lesion quadrant: LOWER OUTER QUADRANT. Using sterile technique with chlorhexidine as skin antisepsis, 1% Lidocaine as local anesthetic, under direct ultrasound visualization, a 12 gauge Bard Marquee core needle device placed through an 11 gauge introducer needle was used to perform biopsy of the mass in  the LOWER OUTER QUADRANT of the RIGHT breast using an inferolateral approach. At the conclusion of the procedure a ribbon shaped tissue marker clip was deployed into the biopsy cavity. Follow up 2 view mammogram was performed and dictated separately. IMPRESSION: Ultrasound guided biopsy of an indeterminate 7 mm mass involving the LOWER OUTER QUADRANT of the RIGHT breast. No apparent complications. Electronically Signed: By: Evangeline Dakin M.D. On: 04/19/2021 13:47      IMPRESSION/PLAN: 1. Stage IA, [id number] grade 3, ER/PR positive invasive ductal carcinoma of the right breast. Dr. MLisbeth Renshawdiscusses the pathology findings and reviews the nature of right breast disease. The consensus from the breast conference includes  right breast conservation with lumpectomy with sentinel node biopsy. Dr. MJana Hakimanticipates Oncotype Dx score to determine a role for systemic therapy. Provided that chemotherapy is not indicated, the patient's course would then be followed by external radiotherapy to the breast  to reduce risks of local recurrence followed by antiestrogen therapy. We discussed the risks, benefits, short, and long term effects of radiotherapy, as well as the curative intent, and the patient is interested in proceeding. Dr. MLisbeth Renshawdiscusses the delivery and logistics of radiotherapy and anticipates a course of 4 or up to 6 1/2 weeks of radiotherapy to the right breast. We will see her back a few weeks after surgery to discuss the simulation process and anticipate we starting radiotherapy about 4-6 weeks after surgery.  2. Left breast mass. The pathology was benign and this will be followed closely during her surveillance. 3. Possible genetic predisposition to malignancy. The patient is a candidate for genetic testing given her personal and family history. She was offered referral and will meet with genetic testing today as  well.   In a visit lasting 60  minutes, greater than 50% of the time was spent face to  face reviewing her case, as well as in preparation of, discussing, and coordinating the patient's care.  The above documentation reflects my direct findings during this shared patient visit. Please see the separate note by Dr. Lisbeth Renshaw on this date for the remainder of the patient's plan of care.    Carola Rhine, [facility]    **Disclaimer: This note was dictated with voice recognition software. Similar sounding words can inadvertently be transcribed and this note may contain transcription errors which may not have been corrected upon publication of note.**

## 2021-04-29 ENCOUNTER — Encounter: Payer: Self-pay | Admitting: Genetic Counselor

## 2021-04-29 DIAGNOSIS — Z803 Family history of malignant neoplasm of breast: Secondary | ICD-10-CM | POA: Insufficient documentation

## 2021-04-29 DIAGNOSIS — Z8049 Family history of malignant neoplasm of other genital organs: Secondary | ICD-10-CM | POA: Insufficient documentation

## 2021-04-29 DIAGNOSIS — Z8042 Family history of malignant neoplasm of prostate: Secondary | ICD-10-CM | POA: Insufficient documentation

## 2021-04-29 DIAGNOSIS — C50511 Malignant neoplasm of lower-outer quadrant of right female breast: Secondary | ICD-10-CM | POA: Diagnosis not present

## 2021-04-29 NOTE — Progress Notes (Signed)
REFERRING PROVIDER: Lurline Del, MD [place], [place] [address]  PRIMARY PROVIDER:  [name], PA-C  PRIMARY REASON FOR VISIT:  1. Malignant neoplasm of lower-outer quadrant of right breast of female, estrogen receptor positive ([place])   2. Family history of breast cancer   3. Family history of uterine cancer   4. Family history of prostate cancer     HISTORY OF PRESENT ILLNESS:   Ms. Telleria, a 70 y.o. female, was seen for a [place] cancer genetics consultation during the breast multidisciplinary clinic at the request of Dr. Alden Hipp due to a personal and family history of cancer.  Ms. Kilmartin presents to clinic today to discuss the possibility of a hereditary predisposition to cancer, to discuss genetic testing, and to further clarify her future cancer risks, as well as potential cancer risks for family members.   In September, at the age of 46, Ms. Roca was diagnosed with IDC of the right breast. The tumor is describes as ER/PR positive and HER2 negative. The treatment plan is pending.   CANCER HISTORY:  Oncology History  Malignant neoplasm of lower-outer quadrant of right breast of female, estrogen receptor positive ([place])  04/23/2021 Initial Diagnosis   Malignant neoplasm of lower-outer quadrant of right breast of female, estrogen receptor positive ([place])   04/28/2021 Cancer Staging   Staging form: Breast, AJCC 8th Edition - Clinical stage from 04/28/2021: Stage IA (cT1b, cN0, cM0, G3, ER+, PR+, HER2-) - Signed by Chauncey Cruel, MD on 04/28/2021 Stage prefix: Initial diagnosis Histologic grading system: 3 grade system      RISK FACTORS:  Menarche was at age 89.  First live birth at age 87.  OCP use for approximately 12 years.  Ovaries intact: yes.  Uterus intact: yes.  Menopausal status: postmenopausal.  HRT use: 0 years. Colonoscopy: yes; in 2019 Mammogram within the last year: yes. Any excessive radiation exposure in the past:  yes  Past Medical History:  Diagnosis Date   Allergy 1960s, 1970s, 2000   Arthritis    Breast cancer ([place])    Cataract    no surgery yet   Hyperlipidemia     Past Surgical History:  Procedure Laterality Date   BREAST EXCISIONAL BIOPSY Left 1999   BREAST SURGERY  1999   Lumpectomy   CESAREAN SECTION  1982/1983   2 times   COLONOSCOPY  2008   EYE SURGERY  01/2021   Vitrectomy   LAPAROSCOPY     abd/tubal ligation   TUBAL LIGATION  1988   VITRECTOMY      Social History   Socioeconomic History   Marital status: Married    Spouse name: John   Number of children: 2   Years of education: 18   Highest education level: Master's degree (e.g., MA, MS, MEng, MEd, MSW, MBA)  Occupational History   Occupation: Pharmacist, hospital    Comment: retired  Tobacco Use   Smoking status: Former    Packs/day: 0.25    Years: 20.00    Pack years: 5.00    Types: Cigarettes    Quit date: 05/09/1989    Years since quitting: 31.9   Smokeless tobacco: Never  Vaping Use   Vaping Use: Never used  Substance and Sexual Activity   Alcohol use: Yes    Alcohol/week: 2.0 - 3.0 standard drinks    Types: 2 - 3 Glasses of wine per week    Comment: wine    Drug use: No   Sexual activity: Not  Currently    Birth control/protection: Post-menopausal  Other Topics Concern   Not on file  Social History Narrative   Walks when has time.   Caffeine- coffee in the morning   Social Determinants of Health   Financial Resource Strain: Not on file  Food Insecurity: Not on file  Transportation Needs: Not on file  Physical Activity: Not on file  Stress: Not on file  Social Connections: Not on file     FAMILY HISTORY:  We obtained a detailed, 4-generation family history.  Significant diagnoses are listed below: Family History  Problem Relation Age of Onset   Cancer Mother 47       Uterine   Breast cancer Mother 34   Cancer Father 15       Lung   Breast cancer Sister 15   Prostate cancer Brother 39   Lung  cancer Brother 81   Thyroid cancer Brother        dx. early 9s        Ms. Mabus's sister has a personal history of breast cancer diagnosed at 68 that metastasized, she died at 48. Her brother was diagnosed with prostate cancer at 14, he had radiation as his treatment. Her second brother was diagnosed with thyroid cancer in his early 77s and lung cancer at 71, he is deceased. Ms. Pokorny mother was diagnosed with breast cancer at 53 and uterine cancer at 109, she died at 57. Her father was diagnosed with lung cancer at 33, he smoked, died at 48.   Ms. Delamater is unaware of previous family history of genetic testing for hereditary cancer risks. She reports that she personally had negative BRCA1/2 genetic testing about 5-6 years ago. There no reported Ashkenazi Jewish ancestry.   GENETIC COUNSELING ASSESSMENT: Ms. Haffey is a 70 y.o. female with a personal and family history of cancer which is somewhat suggestive of a hereditary cancer syndrome and predisposition to cancer given the types of cancers. We, therefore, discussed and recommended the following at today's visit.   DISCUSSION: We discussed that 5 - 10% of cancer is hereditary, with most cases of hereditary breast cancer associated with mutations in BRCA1/2.  There are other genes that can be associated with hereditary breast cancer syndromes. Type of cancer risk and level of risk are gene-specific. We discussed that testing is beneficial for several reasons including knowing how to follow individuals after completing their treatment, identifying whether potential treatment options would be beneficial, and understanding if other family members could be at risk for cancer and allowing them to undergo genetic testing.   We reviewed the characteristics, features and inheritance patterns of hereditary cancer syndromes. We also discussed genetic testing, including the appropriate family members to test, the process of testing, insurance coverage  and turn-around-time for results. We discussed the implications of a negative, positive and/or variant of uncertain significant result. In order to get genetic test results in a timely manner so that Ms. Rossitto can use these genetic test results for surgical decisions, we recommended Ms. Sobieski pursue genetic testing for the BRCAplus. Once complete, we recommend Ms. Flinchum pursue reflex genetic testing to a more comprehensive gene panel.   Ms. Doell  was offered a common hereditary cancer panel (47 genes) and an expanded pan-cancer panel (77 genes). Ms. Escajeda was informed of the benefits and limitations of each panel, including that expanded pan-cancer panels contain genes that do not have clear management guidelines at this point in time.  We also discussed that  as the number of genes included on a panel increases, the chances of variants of uncertain significance increases.  After considering the benefits and limitations of each gene panel, Ms. Gillean  elected to have [facility] CustomNext+RNA (47 genes).   The CustomNext-Cancer+RNAinsight panel offered by [facility] includes sequencing and rearrangement analysis for the following 47 genes:  APC, ATM, AXIN2, BARD1, BMPR1A, BRCA1, BRCA2, BRIP1, CDH1, CDK4, CDKN2A, CHEK2, DICER1, EPCAM, GREM1, HOXB13, MEN1, MLH1, MSH2, MSH3, MSH6, MUTYH, NBN, NF1, NF2, NTHL1, PALB2, PMS2, POLD1, POLE, PTEN, RAD51C, RAD51D, RECQL, RET, SDHA, SDHAF2, SDHB, SDHC, SDHD, SMAD4, SMARCA4, STK11, TP53, TSC1, TSC2, and VHL.  RNA data is routinely analyzed for use in variant interpretation for all genes.   Based on Ms. Faul's personal and family history of cancer, she meets medical criteria for genetic testing. Despite that she meets criteria, she may still have an out of pocket cost. We discussed that if her out of pocket cost for testing is over $100, the laboratory should contact them to discuss self-pay prices, patient pay assistance programs, if applicable, and other billing  options.   PLAN: After considering the risks, benefits, and limitations, Ms. Ludwig provided informed consent to pursue genetic testing and the blood sample was sent to [facility] for analysis of the BRCAplus with reflex to CustomNext+RNA (47 genes). Results should be available within approximately 1-2 weeks' time, at which point they will be disclosed by telephone to Ms. Funez, as will any additional recommendations warranted by these results. Ms. Fano will receive a summary of her genetic counseling visit and a copy of her results once available. This information will also be available in Epic.   Ms. Chard questions were answered to her satisfaction today. Our contact information was provided should additional questions or concerns arise. Thank you for the referral and allowing [place] to share in the care of your patient.   Lucille Passy, MS, [facility] Genetic Counselor [email or web address] (P) [phone]  The patient was seen for a total of 15 minutes in face-to-face genetic counseling.  The patient brought her husband. Drs. Magrinat, Lindi Adie and/or Burr Medico were available to discuss this case as needed.  _______________________________________________________________________ For Office Staff:  Number of people involved in session: 2 Was an Intern/ student involved with case: no

## 2021-04-30 ENCOUNTER — Other Ambulatory Visit: Payer: Self-pay | Admitting: Surgery

## 2021-04-30 DIAGNOSIS — Z853 Personal history of malignant neoplasm of breast: Secondary | ICD-10-CM

## 2021-05-04 ENCOUNTER — Telehealth: Payer: Self-pay | Admitting: *Deleted

## 2021-05-04 ENCOUNTER — Encounter: Payer: Self-pay | Admitting: *Deleted

## 2021-05-04 DIAGNOSIS — C50511 Malignant neoplasm of lower-outer quadrant of right female breast: Secondary | ICD-10-CM

## 2021-05-04 NOTE — Telephone Encounter (Signed)
Spoke to pt concerning [place] from 10.5.22. Denies questions or concerns regarding dx or treatment care plan. Encourage pt to call with needs. Received verbal understanding.

## 2021-05-05 ENCOUNTER — Other Ambulatory Visit: Payer: Self-pay | Admitting: *Deleted

## 2021-05-05 DIAGNOSIS — Z170 Estrogen receptor positive status [ER+]: Secondary | ICD-10-CM

## 2021-05-05 DIAGNOSIS — C50511 Malignant neoplasm of lower-outer quadrant of right female breast: Secondary | ICD-10-CM

## 2021-05-06 ENCOUNTER — Encounter: Payer: Self-pay | Admitting: *Deleted

## 2021-05-10 ENCOUNTER — Telehealth: Payer: Self-pay | Admitting: Genetic Counselor

## 2021-05-10 ENCOUNTER — Encounter: Payer: Self-pay | Admitting: Genetic Counselor

## 2021-05-10 ENCOUNTER — Other Ambulatory Visit: Payer: Self-pay | Admitting: Oncology

## 2021-05-10 DIAGNOSIS — Z1379 Encounter for other screening for genetic and chromosomal anomalies: Secondary | ICD-10-CM | POA: Insufficient documentation

## 2021-05-10 HISTORY — DX: Encounter for other screening for genetic and chromosomal anomalies: Z13.79

## 2021-05-10 NOTE — Progress Notes (Unsigned)
[place]  Telephone:([phone]) [phone] Fax:(336) [phone]     ID: Janice Dudley DOB: 1951-06-29  MR#: [id number]  TJQ#:[id number]  Patient Care Team: Lavada Mesi as PCP - General (Family Medicine) Mauro Kaufmann, RN as Oncology Nurse Navigator Rockwell Germany, RN as Oncology Nurse Navigator Coralie Keens, MD as Consulting Physician (General Surgery) Lilibeth Opie, [name], MD as Consulting Physician (Oncology) [name], MD as Consulting Physician (Radiation Oncology) [name], MD as Consulting Physician (Obstetrics and Gynecology) Silverio Decamp, MD as Consulting Physician (Sports Medicine) [name], MD as Referring Physician (Specialist) Chauncey Cruel, MD OTHER MD:  CHIEF COMPLAINT: Estrogen receptor positive breast cancer  CURRENT TREATMENT: Awaiting definitive surgery   HISTORY OF CURRENT ILLNESS: Janice Dudley "Janice Dudley" was under every-six-month follow up for a likely-benign left breast mass. She underwent bilateral diagnostic mammography with tomography and bilateral breast ultrasonography at [facility] on 04/12/2021 showing: breast density category B; new indeterminate 7 mm mass in right breast at 7 o'clock; no evidence of right axillary adenopathy; the likely-benign left breast mass at 4 o'clock is stable.  Accordingly on 04/19/2021 she proceeded to biopsy of the right breast area in question. The pathology from this procedure ([id number]) showed: invasive ductal carcinoma, grade 2/3. Prognostic indicators significant for: estrogen receptor, >95% positive with strong staining intensity and progesterone receptor, 40% positive with moderate staining intensity. Proliferation marker Ki67 at 15%. HER2 equivocal by immunohistochemistry (2+), but negative by fluorescent in situ hybridization with a signals ratio 2.02 and 1.89 and number per cell 4.55 and 4.15}.  She also underwent biopsy of the left  breast mass on 04/27/2021. Pathology ([id number]) was benign, showing fibrocystic changes including apocrine metaplasia.  Cancer Staging Malignant neoplasm of lower-outer quadrant of right breast of female, estrogen receptor positive ([place]) Staging form: Breast, AJCC 8th Edition - Clinical stage from 04/28/2021: Stage IA (cT1b, cN0, cM0, G3, ER+, PR+, HER2-) - Signed by Chauncey Cruel, MD on 04/28/2021 Stage prefix: Initial diagnosis Histologic grading system: 3 grade system  The patient's subsequent history is as detailed below.   INTERVAL HISTORY: Janice "Janice Dudley" was evaluated in the multidisciplinary breast cancer clinic on 04/28/2021 accompanied by her husband Janice Dudley. Her case was also presented at the multidisciplinary breast cancer conference on the same day. At that time a preliminary plan was proposed: Breast conserving surgery with sentinel lymph node sampling, Oncotype, adjuvant radiation, antiestrogens and genetics testing   REVIEW OF SYSTEMS: On the provided questionnaire, Janice Dudley reports knee pain, new blurred vision, hearing loss (does not wear hearing aids), back pain, arthritis, and headaches. The patient denies nausea, vomiting, stiff neck, dizziness, or gait imbalance. There has been no cough, phlegm production, or pleurisy, no chest pain or pressure, and no change in bowel or bladder habits. The patient denies fever, rash, bleeding, unexplained fatigue or unexplained weight loss. A detailed review of systems was otherwise entirely negative.   COVID 19 VACCINATION STATUS: Pfizer x3 as of October 2022   PAST MEDICAL HISTORY: Past Medical History:  Diagnosis Date   Allergy 1960s, 1970s, 2000   Arthritis    Breast cancer ([place])    Cataract    no surgery yet   Hyperlipidemia     PAST SURGICAL HISTORY: Past Surgical History:  Procedure Laterality Date   BREAST EXCISIONAL BIOPSY Left 1999   BREAST SURGERY  1999   Lumpectomy   CESAREAN SECTION  1982/1983   2 times    COLONOSCOPY  2008  EYE SURGERY  01/2021   Vitrectomy   LAPAROSCOPY     abd/tubal ligation   TUBAL LIGATION  1988   VITRECTOMY      FAMILY HISTORY: Family History  Problem Relation Age of Onset   Stroke Mother 32   Heart attack Mother 31   Cancer Mother 70       Uterine   Breast cancer Mother 17   Heart attack Father 51   Cancer Father 26       Lung   Stroke Father 18   Hyperlipidemia Sister    Breast cancer Sister 62   Hyperlipidemia Sister    Thyroid nodules Brother    Hyperlipidemia Brother    Prostate cancer Brother 47   Hyperlipidemia Brother    Lung cancer Brother 18   Thyroid cancer Brother        dx. early 74s   Hyperlipidemia Brother    Anxiety disorder Daughter    Depression Daughter    Colon cancer Neg Hx    Esophageal cancer Neg Hx    Rectal cancer Neg Hx    Her father died at age 89 from [facility]. He had a history of lung cancer at age 28. Her mother died at age 58 from uterine cancer. She had a history of breast cancer at age 74 and apparently was treated with tamoxifen. Janice Dudley has three brothers and three sisters. One sister had breast and uterine cancer at age 33 (possibly also treated with tamoxifen), a brother had lung cancer at age 66, and another brother had prostate cancer at age 13.  There is no history of colon cancer in the family   GYNECOLOGIC HISTORY:  No LMP recorded. Patient is postmenopausal. Menarche: 70 years old Age at first live birth: 70 years old [place] P 2 LMP 2006 Contraceptive: used for 12 years HRT: never used  Hysterectomy? no BSO? no   SOCIAL HISTORY: (updated 04/2021)  [name] is currently retired from working as a Pharmacist, hospital for pre-K to [employer]. Husband Janice Dudley is a retired Government social research officer. Daughter [name], age 100, is a Secretary/administrator in [place], [place]. Son [name], age 51, is an MRI tech in [place]. Janice Dudley has three grandchildren. She attends a [employer] in [place].    ADVANCED DIRECTIVES: in place   HEALTH  MAINTENANCE: Social History   Tobacco Use   Smoking status: Former    Packs/day: 0.25    Years: 20.00    Pack years: 5.00    Types: Cigarettes    Quit date: 05/09/1989    Years since quitting: 32.0   Smokeless tobacco: Never  Vaping Use   Vaping Use: Never used  Substance Use Topics   Alcohol use: Yes    Alcohol/week: 2.0 - 3.0 standard drinks    Types: 2 - 3 Glasses of wine per week    Comment: wine    Drug use: No     Colonoscopy: 04/2018 (Dr. Ardis Hughs), recall 2029  PAP: 2021  Bone density: 2021   Allergies  Allergen Reactions   Peach [Prunus Persica]     Hives/itching   Sulfa Antibiotics Hives   Topamax [Topiramate]     Bilateral leg pain   Shellfish Allergy Hives    Current Outpatient Medications  Medication Sig Dispense Refill   albuterol (VENTOLIN HFA) 108 (90 Base) MCG/ACT inhaler INHALE 2 PUFFS INTO THE LUNGS EVERY 6 HOURS AS NEEDED FOR WHEEZING OR SHORTNESS OF BREATH 25.5 g 0   aspirin 81 MG tablet Take 81  mg by mouth daily.     atorvastatin (LIPITOR) 40 MG tablet Take 1 tablet (40 mg total) by mouth daily. 90 tablet 3   b complex vitamins tablet Take 1 tablet by mouth daily.     buPROPion (WELLBUTRIN SR) 150 MG 12 hr tablet Take 1 tablet (150 mg total) by mouth 2 (two) times daily. 180 tablet 3   calcium carbonate (TUMS EX) 750 MG chewable tablet Chew 1 tablet by mouth daily.     cholecalciferol (VITAMIN D) 1000 UNITS tablet Take 1,000 Units by mouth daily.     cyclopentolate (CYCLODRYL,CYCLOGYL) 1 % ophthalmic solution 1 drop once. As directed.     erythromycin ophthalmic ointment 1 application at bedtime. As directed     hydrochlorothiazide (HYDRODIURIL) 12.5 MG tablet Take 1 tablet (12.5 mg total) by mouth daily. 90 tablet 1   KRILL OIL PO Take by mouth.     Magnesium 250 MG TABS Take by mouth daily.      MELATONIN PO Take 10 mg by mouth at bedtime.      METRONIDAZOLE, TOPICAL, 0.75 % LOTN Apply 1 application topically in the morning and at bedtime. 59  mL 3   traZODone (DESYREL) 50 MG tablet TAKE 1 TO 2 TABLETS BY MOUTH EVERY NIGHT 1 HOUR BEFORE BEDTIME AS NEEDED. 180 tablet 3   No current facility-administered medications for this visit.    OBJECTIVE: White woman who appears stated age  There were no vitals filed for this visit.    There is no height or weight on file to calculate BMI.   Wt Readings from Last 3 Encounters:  04/28/21 183 lb 3.2 oz (83.1 kg)  12/25/20 189 lb (85.7 kg)  05/26/20 197 lb (89.4 kg)      ECOG FS:1 - Symptomatic but completely ambulatory  Ocular: Sclerae unicteric, pupils round and equal Ear-nose-throat: Wearing a mask Lymphatic: No cervical or supraclavicular adenopathy Lungs no rales or rhonchi Heart regular rate and rhythm Abd soft, nontender, positive bowel sounds MSK no focal spinal tenderness, no joint edema Neuro: non-focal, well-oriented, appropriate affect Breasts: The right breast is status post recent biopsy.  There is a moderate ecchymosis.  I do not palpate a mass.  The left breast is also status post recent biopsy.  There are no skin or nipple changes of concern on either side.  Both axillae are benign   LAB RESULTS:  CMP     Component Value Date/Time   NA 135 04/28/2021 1231   K 4.1 04/28/2021 1231   CL 99 04/28/2021 1231   CO2 26 04/28/2021 1231   GLUCOSE 96 04/28/2021 1231   BUN 13 04/28/2021 1231   CREATININE 0.90 04/28/2021 1231   CREATININE 0.79 12/25/2020 1000   CALCIUM 9.9 04/28/2021 1231   PROT 7.1 04/28/2021 1231   ALBUMIN 4.1 04/28/2021 1231   AST 20 04/28/2021 1231   ALT 16 04/28/2021 1231   ALKPHOS 101 04/28/2021 1231   BILITOT 0.6 04/28/2021 1231   GFRNONAA >60 04/28/2021 1231   GFRNONAA 77 12/25/2020 1000   GFRAA 89 12/25/2020 1000    No results found for: TOTALPROTELP, ALBUMINELP, A1GS, A2GS, BETS, BETA2SER, GAMS, MSPIKE, SPEI  Lab Results  Component Value Date   WBC 6.4 04/28/2021   NEUTROABS 4.4 04/28/2021   HGB 12.6 04/28/2021   HCT 37.3  04/28/2021   MCV 93.3 04/28/2021   PLT 291 04/28/2021    No results found for: LABCA2  No components found for: INOMVE720  No results for  input(s): INR in the last 168 hours.  No results found for: LABCA2  No results found for: YDX412  No results found for: INO676  No results found for: HMC947  No results found for: CA2729  No components found for: HGQUANT  No results found for: CEA1 / No results found for: CEA1   No results found for: AFPTUMOR  No results found for: CHROMOGRNA  No results found for: KPAFRELGTCHN, LAMBDASER, KAPLAMBRATIO (kappa/lambda light chains)  No results found for: HGBA, HGBA2QUANT, HGBFQUANT, HGBSQUAN (Hemoglobinopathy evaluation)   No results found for: LDH  No results found for: IRON, TIBC, IRONPCTSAT (Iron and TIBC)  No results found for: FERRITIN  Urinalysis    Component Value Date/Time   BILIRUBINUR negative 03/28/2017 1622   KETONESUR negative 03/20/2014 1447   PROTEINUR negative 03/28/2017 1622   UROBILINOGEN 0.2 03/28/2017 1622   NITRITE positive 03/28/2017 1622   LEUKOCYTESUR Trace (A) 03/28/2017 1622     STUDIES: US BREAST LTD UNI LEFT INC AXILLA  Result Date: 04/12/2021 CLINICAL DATA:  70 year old female presenting for follow-up of a likely benign left breast mass. The patient has family history of breast cancer in her mother at age 40 and her sister at age 32. EXAM: DIGITAL DIAGNOSTIC BILATERAL MAMMOGRAM WITH TOMOSYNTHESIS AND CAD; ULTRASOUND LEFT BREAST LIMITED; ULTRASOUND RIGHT BREAST LIMITED TECHNIQUE: Bilateral digital diagnostic mammography and breast tomosynthesis was performed. The images were evaluated with computer-aided detection.; Targeted ultrasound examination of the left breast was performed.; Targeted ultrasound examination of the right breast was performed COMPARISON:  Previous exam(s). ACR Breast Density Category b: There are scattered areas of fibroglandular density. FINDINGS: The mass in the slightly lower  outer left breast appears mammographically stable. There is a new asymmetry in the central right breast, mid to anterior depth, which on spot compression tomosynthesis imaging appears to be associated with some distortion and few calcifications. No other suspicious calcifications, masses or areas of distortion are seen in the bilateral breasts. Ultrasound targeted to the right breast at 7 o'clock, 1 cm from the nipple demonstrates a bilobed irregular hypoechoic mass measuring 7 mm in an oblique plane. A benign cyst is incidentally noted at 7 o'clock, 5 cm from the nipple measuring 7 mm. Ultrasound of the right axilla demonstrates multiple normal-appearing lymph nodes. Ultrasound targeted to the left breast at 4 o'clock, 2 cm from the nipple demonstrates a stable oval hypoechoic mass measuring 6 x 4 x 6 mm, previously measuring 6 x 4 x 5 mm. IMPRESSION: 1. There is a new indeterminate mass in the right breast at 7 o'clock. 2.  No evidence of right axillary lymphadenopathy. 3.  The likely benign left breast mass at 4 o'clock is stable. RECOMMENDATION: 1. Ultrasound guided biopsy is recommended for the right breast mass at 7 o'clock. This has been scheduled for 04/19/2021 at 12:45 p.m. 2. If this right breast mass is malignant, ultrasound-guided biopsy of the likely benign left breast mass at 4 o'clock will be recommended prior to surgery. I have discussed the findings and recommendations with the patient. If applicable, a reminder letter will be sent to the patient regarding the next appointment. BI-RADS CATEGORY  4: Suspicious. Electronically Signed   By: Ammie Ferrier M.D.   On: 04/12/2021 11:26  US BREAST LTD UNI RIGHT INC AXILLA  Result Date: 04/12/2021 CLINICAL DATA:  70 year old female presenting for follow-up of a likely benign left breast mass. The patient has family history of breast cancer in her mother at age 21 and her sister  at age 89. EXAM: DIGITAL DIAGNOSTIC BILATERAL MAMMOGRAM WITH  TOMOSYNTHESIS AND CAD; ULTRASOUND LEFT BREAST LIMITED; ULTRASOUND RIGHT BREAST LIMITED TECHNIQUE: Bilateral digital diagnostic mammography and breast tomosynthesis was performed. The images were evaluated with computer-aided detection.; Targeted ultrasound examination of the left breast was performed.; Targeted ultrasound examination of the right breast was performed COMPARISON:  Previous exam(s). ACR Breast Density Category b: There are scattered areas of fibroglandular density. FINDINGS: The mass in the slightly lower outer left breast appears mammographically stable. There is a new asymmetry in the central right breast, mid to anterior depth, which on spot compression tomosynthesis imaging appears to be associated with some distortion and few calcifications. No other suspicious calcifications, masses or areas of distortion are seen in the bilateral breasts. Ultrasound targeted to the right breast at 7 o'clock, 1 cm from the nipple demonstrates a bilobed irregular hypoechoic mass measuring 7 mm in an oblique plane. A benign cyst is incidentally noted at 7 o'clock, 5 cm from the nipple measuring 7 mm. Ultrasound of the right axilla demonstrates multiple normal-appearing lymph nodes. Ultrasound targeted to the left breast at 4 o'clock, 2 cm from the nipple demonstrates a stable oval hypoechoic mass measuring 6 x 4 x 6 mm, previously measuring 6 x 4 x 5 mm. IMPRESSION: 1. There is a new indeterminate mass in the right breast at 7 o'clock. 2.  No evidence of right axillary lymphadenopathy. 3.  The likely benign left breast mass at 4 o'clock is stable. RECOMMENDATION: 1. Ultrasound guided biopsy is recommended for the right breast mass at 7 o'clock. This has been scheduled for 04/19/2021 at 12:45 p.m. 2. If this right breast mass is malignant, ultrasound-guided biopsy of the likely benign left breast mass at 4 o'clock will be recommended prior to surgery. I have discussed the findings and recommendations with the  patient. If applicable, a reminder letter will be sent to the patient regarding the next appointment. BI-RADS CATEGORY  4: Suspicious. Electronically Signed   By: Ammie Ferrier M.D.   On: 04/12/2021 11:26  MM DIAG BREAST TOMO BILATERAL  Result Date: 04/12/2021 CLINICAL DATA:  70 year old female presenting for follow-up of a likely benign left breast mass. The patient has family history of breast cancer in her mother at age 29 and her sister at age 69. EXAM: DIGITAL DIAGNOSTIC BILATERAL MAMMOGRAM WITH TOMOSYNTHESIS AND CAD; ULTRASOUND LEFT BREAST LIMITED; ULTRASOUND RIGHT BREAST LIMITED TECHNIQUE: Bilateral digital diagnostic mammography and breast tomosynthesis was performed. The images were evaluated with computer-aided detection.; Targeted ultrasound examination of the left breast was performed.; Targeted ultrasound examination of the right breast was performed COMPARISON:  Previous exam(s). ACR Breast Density Category b: There are scattered areas of fibroglandular density. FINDINGS: The mass in the slightly lower outer left breast appears mammographically stable. There is a new asymmetry in the central right breast, mid to anterior depth, which on spot compression tomosynthesis imaging appears to be associated with some distortion and few calcifications. No other suspicious calcifications, masses or areas of distortion are seen in the bilateral breasts. Ultrasound targeted to the right breast at 7 o'clock, 1 cm from the nipple demonstrates a bilobed irregular hypoechoic mass measuring 7 mm in an oblique plane. A benign cyst is incidentally noted at 7 o'clock, 5 cm from the nipple measuring 7 mm. Ultrasound of the right axilla demonstrates multiple normal-appearing lymph nodes. Ultrasound targeted to the left breast at 4 o'clock, 2 cm from the nipple demonstrates a stable oval hypoechoic mass measuring 6 x 4 x  6 mm, previously measuring 6 x 4 x 5 mm. IMPRESSION: 1. There is a new indeterminate mass in the  right breast at 7 o'clock. 2.  No evidence of right axillary lymphadenopathy. 3.  The likely benign left breast mass at 4 o'clock is stable. RECOMMENDATION: 1. Ultrasound guided biopsy is recommended for the right breast mass at 7 o'clock. This has been scheduled for 04/19/2021 at 12:45 p.m. 2. If this right breast mass is malignant, ultrasound-guided biopsy of the likely benign left breast mass at 4 o'clock will be recommended prior to surgery. I have discussed the findings and recommendations with the patient. If applicable, a reminder letter will be sent to the patient regarding the next appointment. BI-RADS CATEGORY  4: Suspicious. Electronically Signed   By: Ammie Ferrier M.D.   On: 04/12/2021 11:26  MM CLIP PLACEMENT LEFT  Result Date: 04/27/2021 CLINICAL DATA:  Status post ultrasound-guided core biopsy of LEFT breast mass. EXAM: 3D DIAGNOSTIC LEFT MAMMOGRAM POST ULTRASOUND BIOPSY COMPARISON:  Previous exam(s). FINDINGS: 3D Mammographic images were obtained following ultrasound guided biopsy of mass in the 4 o'clock location of the LEFT breast and placement a ribbon shaped clip. The biopsy marking clip is in expected position at the site of biopsy. IMPRESSION: Appropriate positioning of the ribbon shaped biopsy marking clip at the site of biopsy in the LOWER OUTER QUADRANT of the LEFT breast. Final Assessment: Post Procedure Mammograms for Marker Placement Electronically Signed   By: Nolon Nations M.D.   On: 04/27/2021 12:18  MM CLIP PLACEMENT RIGHT  Result Date: 04/19/2021 CLINICAL DATA:  Confirmation of clip placement after ultrasound-guided core needle biopsy of a mass involving the LOWER OUTER QUADRANT of the RIGHT breast. EXAM: 2D and 3D DIAGNOSTIC RIGHT MAMMOGRAM POST ULTRASOUND BIOPSY COMPARISON:  Previous exam(s). FINDINGS: Tomosynthesis and synthesized full field CC and mediolateral images were obtained following ultrasound guided biopsy of a mass involving the LOWER OUTER QUADRANT of  the RIGHT breast. The ribbon shaped tissue marking clip is appropriately positioned approximately 7 mm lateral to the biopsied mass. Post biopsy changes are present within the mass. Expected post biopsy changes are present without evidence hematoma. IMPRESSION: Appropriate positioning of the ribbon shaped biopsy marking clip at the site of the biopsied mass in the LOWER OUTER QUADRANT of the RIGHT breast. Final Assessment: Post Procedure Mammograms for Marker Placement Electronically Signed   By: Evangeline Dakin M.D.   On: 04/19/2021 13:46  [place] LT BREAST BX W LOC DEV 1ST LESION IMG BX SPEC US GUIDE  Addendum Date: 04/28/2021   ADDENDUM REPORT: 04/28/2021 12:23 ADDENDUM: Pathology revealed FIBROCYSTIC CHANGES INCLUDING APOCRINE METAPLASIA of the LEFT breast, lower outer, 4 o'clock, (ribbon clip). This was found to be concordant by Dr. Nolon Nations. Pathology results were discussed with the patient by telephone. The patient reported doing well after the biopsy with tenderness at the site. Post biopsy instructions and care were reviewed and questions were answered. The patient was encouraged to call [facility] for any additional concerns. My direct phone number was provided. The patient has a recent diagnosis of RIGHT breast cancer and was referred to [facility] on April 28, 2021. Pathology results reported by Terie Purser, RN on 04/28/2021. Electronically Signed   By: Nolon Nations M.D.   On: 04/28/2021 12:23   Result Date: 04/28/2021 CLINICAL DATA:  Patient presents for ultrasound-guided core biopsy of LEFT breast mass. Recent biopsy of  the RIGHT breast shows invasive ductal carcinoma in the 7 o'clock location. EXAM: ULTRASOUND GUIDED LEFT BREAST CORE NEEDLE BIOPSY COMPARISON:  Previous exam(s). PROCEDURE: I met with the patient and we discussed the procedure of ultrasound-guided biopsy, including  benefits and alternatives. We discussed the high likelihood of a successful procedure. We discussed the risks of the procedure, including infection, bleeding, tissue injury, clip migration, and inadequate sampling. Informed written consent was given. The usual time-out protocol was performed immediately prior to the procedure. Lesion quadrant: LOWER OUTER QUADRANT LEFT breast Using sterile technique and 1% Lidocaine as local anesthetic, under direct ultrasound visualization, a 12 gauge spring-loaded device was used to perform biopsy of mass in the 4 o'clock location of the LEFT breast using a LATERAL to MEDIAL approach. At the conclusion of the procedure ribbon shaped tissue marker clip was deployed into the biopsy cavity. Follow up 2 view mammogram was performed and dictated separately. IMPRESSION: Ultrasound guided biopsy of LEFT breast mass. No apparent complications. Electronically Signed: By: Nolon Nations M.D. On: 04/27/2021 12:17  [place] RT BREAST BX W LOC DEV 1ST LESION IMG BX SPEC US GUIDE  Addendum Date: 04/20/2021   ADDENDUM REPORT: 04/20/2021 15:12 ADDENDUM: Pathology revealed GRADE II-III INVASIVE DUCTAL CARCINOMA of the RIGHT breast, 7 o'clock 1 cmfn, lower outer quadrant, (ribbon clip). This was found to be concordant by Dr. Peggye Fothergill. Pathology results were discussed with the patient by telephone. The patient reported doing well after the biopsy with tenderness at the site. Post biopsy instructions and care were reviewed and questions were answered. The patient was encouraged to call [facility] for any additional concerns. My direct phone number was provided. The patient was referred to [facility] on April 28, 2021. The patient is scheduled for a LEFT breast ultrasound guided biopsy on April 27, 2021. Further recommendations will be guided by the results of this biopsy. Pathology  results reported by Terie Purser, RN on 04/20/2021. Electronically Signed   By: Evangeline Dakin M.D.   On: 04/20/2021 15:12   Result Date: 04/20/2021 CLINICAL DATA:  Mammographically detected indeterminate new 7 mm mass with subtle distortion involving the LOWER OUTER QUADRANT of the RIGHT breast at 7 o'clock 1 cm from nipple. Strong family history of breast cancer in her mother at age 37 and in a sister at age 65. EXAM: ULTRASOUND GUIDED RIGHT BREAST CORE NEEDLE BIOPSY COMPARISON:  Previous exam(s). PROCEDURE: I met with the patient and we discussed the procedure of ultrasound-guided biopsy, including benefits and alternatives. We discussed the high likelihood of a successful procedure. We discussed the risks of the procedure, including infection, bleeding, tissue injury, clip migration, and inadequate sampling. Informed written consent was given. The usual time-out protocol was performed immediately prior to the procedure. Lesion quadrant: LOWER OUTER QUADRANT. Using sterile technique with chlorhexidine as skin antisepsis, 1% Lidocaine as local anesthetic, under direct ultrasound visualization, a 12 gauge Bard Marquee core needle device placed through an 11 gauge introducer needle was used to perform biopsy of the mass in the LOWER OUTER QUADRANT of the RIGHT breast using an inferolateral approach. At the conclusion of the procedure a ribbon shaped tissue marker clip was deployed into the biopsy cavity. Follow up 2 view mammogram was performed and dictated separately. IMPRESSION: Ultrasound guided biopsy of an indeterminate 7 mm mass involving the LOWER OUTER QUADRANT of the RIGHT breast. No apparent complications. Electronically Signed: By: Marcello Moores  Lawrence M.D. On: 04/19/2021 13:47    ELIGIBLE FOR AVAILABLE RESEARCH PROTOCOL: no  ASSESSMENT: 70 y.o. [place], [place] woman status post right breast lower outer quadrant biopsy 04/19/2021 for a clinical T1b N0, stage IA invasive ductal carcinoma, grade 2/3,  estrogen and progesterone receptor strongly positive, HER2 not amplified, with an MIB-1 of 15%  (1) genetics testing  (2) definitive surgery pending  (3) Oncotype to be obtained from the definitive surgical suckable  (4) adjuvant radiation  (5) antiestrogens  PLAN: I met today with Janice Dudley to review her new diagnosis. Specifically we discussed the biology of her breast cancer, its diagnosis, staging, treatment  options and prognosis. We first reviewed the fact that cancer is not one disease but more than 100 different diseases and that it is important to keep them separate-- otherwise when friends and relatives discuss their own cancer experiences with Janice Dudley confusion can result. Similarly we explained that if breast cancer spreads to the bone or liver, the patient would not have bone cancer or liver cancer, but breast cancer in the bone and breast cancer in the liver: one cancer in three places-- not 3 different cancers which otherwise would have to be treated in 3 different ways.  We discussed the difference between local and systemic therapy. In terms of loco-regional treatment, lumpectomy plus radiation is equivalent to mastectomy as far as survival is concerned. For this reason, and because the cosmetic results are generally superior, we recommend breast conserving surgery.   We then discussed the rationale for systemic therapy. There is some risk that this cancer may have already spread to other parts of her body. Patients frequently ask at this point about bone scans, CAT scans and PET scans to find out if they have occult breast cancer somewhere else. The problem is that in early stage disease we are much more likely to find false positives then true cancers and this would expose the patient to unnecessary procedures as well as unnecessary radiation. Scans cannot answer the question the patient really would like to know, which is whether she has microscopic disease elsewhere in her body. For  those reasons we do not recommend them.  Of course we would proceed to aggressive evaluation of any symptoms that might suggest metastatic disease, but that is not the case here.  Next we went over the options for systemic therapy which are anti-estrogens, anti-HER-2 immunotherapy, and chemotherapy. Janice Dudley does not meet criteria for anti-HER-2 immunotherapy. She is a good candidate for anti-estrogens.  The question of chemotherapy is more complicated. Chemotherapy is most effective in rapidly growing, aggressive tumors. It is much less effective in low-grade, slow growing cancers. Janice Dudley's tumor is intermediate.  For that reason we are going to request an Oncotype from the definitive surgical sample, as suggested by NCCN guidelines.  She is aware those results, which will become available about 2 weeks postop, will determine whether or not she needs chemotherapy  Janice Dudley also meets criteria for genetics testing. In patients who carry a deleterious mutation [for example in a  BRCA gene], the risk of a new breast cancer developing in the future may be sufficiently great that the patient may choose bilateral mastectomies. However if she wishes to keep her breasts in that situation it is safe to do so. That would require intensified screening, which generally means not only yearly mammography but a yearly breast MRI as well.   Batoul has a good understanding of the overall plan. She agrees with it. She knows the goal  of treatment in her case is cure. She will call with any problems that may develop before her next visit here.  Total encounter time 65 minutes.Sarajane Jews C. Shelvy Heckert, MD 05/10/2021 7:17 AM Medical Oncology and Hematology [facility] [place], [place] [address] Tel. [phone]    Fax. [phone]   This document serves as a record of services personally performed by Lurline Del, MD. It was created on his behalf by Wilburn Mylar, a trained medical  scribe. The creation of this record is based on the scribe's personal observations and the provider's statements to them.   I, Lurline Del MD, have reviewed the above documentation for accuracy and completeness, and I agree with the above.    *Total Encounter Time as defined by the Centers for Medicare and Medicaid Services includes, in addition to the face-to-face time of a patient visit (documented in the note above) non-face-to-face time: obtaining and reviewing outside history, ordering and reviewing medications, tests or procedures, care coordination (communications with other health care professionals or caregivers) and documentation in the medical record.

## 2021-05-10 NOTE — Telephone Encounter (Signed)
I contacted [name] to discuss her genetic testing results. No pathogenic variants were identified in the first 8 genes analyzed. We are still waiting on the results for an additional 39 genes and will contact [name] once they are available.    The test report has been scanned into EPIC and is located under the Molecular Pathology section of the Results Review tab.  A portion of the result report is included below for reference. Detailed clinic note to follow.  Janice Passy, MS, [facility] Genetic Counselor [email or web address] (P) [phone]

## 2021-05-10 NOTE — Telephone Encounter (Signed)
I contacted Ms. Gains to discuss her genetic testing results. No pathogenic variants were identified in the 47 genes analyzed.   The test report has been scanned into EPIC and is located under the Molecular Pathology section of the Results Review tab.  A portion of the result report is included below for reference. Detailed clinic note to follow.  Lucille Passy, MS, [facility] Genetic Counselor [email or web address] (P) [phone]

## 2021-05-20 ENCOUNTER — Encounter (HOSPITAL_BASED_OUTPATIENT_CLINIC_OR_DEPARTMENT_OTHER): Payer: Self-pay | Admitting: Surgery

## 2021-05-20 ENCOUNTER — Encounter: Payer: Self-pay | Admitting: Rehabilitation

## 2021-05-20 ENCOUNTER — Other Ambulatory Visit: Payer: Self-pay

## 2021-05-20 ENCOUNTER — Ambulatory Visit: Payer: Medicare PPO | Attending: Oncology | Admitting: Rehabilitation

## 2021-05-20 DIAGNOSIS — Z170 Estrogen receptor positive status [ER+]: Secondary | ICD-10-CM | POA: Insufficient documentation

## 2021-05-20 DIAGNOSIS — C50511 Malignant neoplasm of lower-outer quadrant of right female breast: Secondary | ICD-10-CM | POA: Diagnosis not present

## 2021-05-20 DIAGNOSIS — R293 Abnormal posture: Secondary | ICD-10-CM | POA: Insufficient documentation

## 2021-05-20 NOTE — Therapy (Signed)
[place] @ [place], [place], [address] Phone: [phone]   Fax:  [phone]  Physical Therapy Treatment  Patient Details  Name: Janice Dudley MRN: [id number] Date of Birth: 1951/05/24 Referring Provider (PT): Dr. Ninfa Linden   Encounter Date: 05/20/2021   PT End of Session - 05/20/21 1138     Visit Number 1    Number of Visits 2    Date for PT Re-Evaluation 07/01/21    Authorization Type Needs Auth for additional visits    PT Start Time 1100    PT Stop Time 1140    PT Time Calculation (min) 40 min    Activity Tolerance Patient tolerated treatment well    Behavior During Therapy [facility] for tasks assessed/performed             Past Medical History:  Diagnosis Date   Allergy 1960s, 1970s, 2000   Arthritis    Breast cancer ([place])    Cataract    no surgery yet   Hyperlipidemia     Past Surgical History:  Procedure Laterality Date   BREAST EXCISIONAL BIOPSY Left 1999   BREAST SURGERY  1999   Lumpectomy   CESAREAN SECTION  1982/1983   2 times   COLONOSCOPY  2008   EYE SURGERY  01/2021   Vitrectomy   LAPAROSCOPY     abd/tubal ligation   TUBAL LIGATION  1988   VITRECTOMY      There were no vitals filed for this visit.   Subjective Assessment - 05/20/21 1107     Subjective Nothing to report    Pertinent History Rt lumpectomy with SLNB will be on 05/31/21 with Dr. Ninfa Linden.  Hx of lumpectomy on the left in 1999 but no lymph node removal. Knee OA    Currently in Pain? No/denies                [facility] PT Assessment - 05/20/21 0001       Assessment   Medical Diagnosis Rt breast cancer    Referring Provider (PT) Dr. Ninfa Linden    Onset Date/Surgical Date 05/20/21    Hand Dominance Right    Prior Therapy no      Precautions   Precaution Comments active caner      Balance Screen   Has the patient fallen in the past 6 months No    Has the patient had a decrease in activity  level because of a fear of falling?  No    Is the patient reluctant to leave their home because of a fear of falling?  No      Home Environment   Living Environment Private residence    Living Arrangements Spouse/significant other    Type of [place] to enter      Prior Function   Level of Independence Independent    Vocation Retired    Leisure daughter and I walk every day around 1 mile      Cognition   Overall Cognitive Status Within Functional Limits for tasks assessed      Posture/Postural Control   Posture/Postural Control Postural limitations    Postural Limitations Rounded Shoulders;Increased thoracic kyphosis      ROM / Strength   AROM / PROM / Strength AROM      AROM   AROM Assessment Site Shoulder    Right/Left Shoulder Right;Left    Right Shoulder Extension 55 Degrees    Right  Shoulder Flexion 165 Degrees    Right Shoulder ABduction 165 Degrees    Right Shoulder Internal Rotation 65 Degrees    Right Shoulder External Rotation 90 Degrees               LYMPHEDEMA/ONCOLOGY QUESTIONNAIRE - 05/20/21 0001       Lymphedema Assessments   Lymphedema Assessments Upper extremities      Right Upper Extremity Lymphedema   10 cm Proximal to Olecranon Process 22.5 cm    Olecranon Process 28 cm    10 cm Proximal to Ulnar Styloid Process 22 cm    Just Proximal to Ulnar Styloid Process 17 cm    Across Hand at [employer] 19.4 cm    At [place] of 2nd Digit 7.3 cm      Left Upper Extremity Lymphedema   10 cm Proximal to Olecranon Process 23.3 cm    Olecranon Process 29 cm    10 cm Proximal to Ulnar Styloid Process 22 cm    Just Proximal to Ulnar Styloid Process 17 cm    Across Hand at [employer] 18.8 cm    At [place] of 2nd Digit 7 cm             L-DEX FLOWSHEETS - 05/20/21 1100       L-DEX LYMPHEDEMA SCREENING   Measurement Type Unilateral    L-DEX MEASUREMENT EXTREMITY Upper Extremity    POSITION  Standing    DOMINANT SIDE  Right    At Risk Side Right    BASELINE SCORE (UNILATERAL) -9.2    Comment low to medium quality plot lines possible due to LE edema                                PT Education - 05/20/21 1138     Education Details post op exercises, walking, SOZO and lymphedema    Person(s) Educated Patient    Methods Explanation;Handout    Comprehension Verbalized understanding                 PT Long Term Goals - 05/20/21 1143       PT LONG TERM GOAL #1   Title Pt will return to baseline AROM    Time 6    Period Weeks    Status New      PT LONG TERM GOAL #2   Title Pt will be educated on lymphedema risk reduction and SOZO surveillance    Time 6    Period Weeks    Status New      PT LONG TERM GOAL #3   Title NA      PT LONG TERM GOAL #4   Title NA      PT LONG TERM GOAL #5   Title NA                   Plan - 05/20/21 1141     Clinical Impression Statement Pt presents for baseline assessment prior to Rt lumpectomy and SLNB.  Radiation is planned but hopefully not chemotherapy.  Pt has history of Lt lumpectomy but no SLNB.  Pt ambulates with SPC due to knee OA and for stability.  No limitations at baseline.  Pt will return post surgery for reassessment.    Stability/Clinical Decision Making Stable/Uncomplicated    Clinical Decision Making Low    Rehab Potential Excellent    PT Frequency --  2 visits   PT Duration 6 weeks    PT Treatment/Interventions ADLs/Self Care Home Management;Therapeutic exercise;Patient/family education;Manual techniques    PT Next Visit Plan post op, sign up for ABC, schedule SOZO    PT Home Exercise Plan post op    Consulted and Agree with Plan of Care Patient             Patient will benefit from skilled therapeutic intervention in order to improve the following deficits and impairments:  Decreased knowledge of precautions, Postural dysfunction  Visit Diagnosis: Abnormal posture  Malignant neoplasm of  lower-outer quadrant of right breast of female, estrogen receptor positive ([place])     Problem List Patient Active Problem List   Diagnosis Date Noted   Genetic testing 05/10/2021   Family history of breast cancer 04/29/2021   Family history of uterine cancer 04/29/2021   Family history of prostate cancer 04/29/2021   Malignant neoplasm of lower-outer quadrant of right breast of female, estrogen receptor positive ([place]) 04/23/2021   Elevated liver enzymes 12/28/2020   Primary osteoarthritis of both knees 05/27/2020   Mixed hyperlipidemia 05/26/2020   Perceived hearing changes 05/26/2020   Left breast mass 01/14/2019   Hypotension 01/14/2019   Laceration of right lower extremity 01/14/2019   H/O osteopenia 11/29/2017   Strain of abdominal muscle 09/28/2017   No energy 06/22/2017   Trouble in sleeping 06/22/2017   Atypical pigmented skin lesion 03/02/2016   Post-menopausal 03/02/2016   Abnormal weight gain 05/16/2014   Hypertriglyceridemia 04/15/2014   Obesity 04/15/2014   Fibrocystic breast 03/25/2014   Hyperlipidemia     Stark Bray, PT 05/20/2021, 11:44 AM  [place] @ [place], [place], [address] Phone: [phone]   Fax:  [phone]  Name: Janice Dudley MRN: [id number] Date of Birth: [birth year]

## 2021-05-20 NOTE — Patient Instructions (Signed)
Physical Therapy Information for After Breast Cancer Surgery/Treatment:  Lymphedema is a swelling condition that you may be at risk for in your arm if you have lymph nodes removed from the armpit area.  After a sentinel node biopsy, the risk is approximately 5-9% and is higher after an axillary node dissection.  There is treatment available for this condition and it is not life-threatening.  Contact your physician or physical therapist with concerns. You may begin the 4 shoulder/posture exercises (see additional sheet) when permitted by your physician (typically a week after surgery).  If you have drains, you may need to wait until those are removed before beginning range of motion exercises.  A general recommendation is to not lift your arms above shoulder height until drains are removed.  These exercises should be done to your tolerance and gently.  This is not a "no pain/no gain" type of recovery so listen to your body and stretch into the range of motion that you can tolerate, stopping if you have pain.  If you are having immediate reconstruction, ask your plastic surgeon about doing exercises as he or she may want you to wait. We encourage you to attend the free one time ABC (After Breast Cancer) class offered by [facility].  You will learn information related to lymphedema risk, prevention and treatment and additional exercises to regain mobility following surgery.  You can call [phone] for more information.  This is offered the 1st and 3rd Monday of each month.  You only attend the class one time. While undergoing any medical procedure or treatment, try to avoid blood pressure being taken or needle sticks from occurring on the arm on the side of cancer.   This recommendation begins after surgery and continues for the rest of your life.  This may help reduce your risk of getting lymphedema (swelling in your arm). An excellent resource for those seeking information on  lymphedema is the National Lymphedema Network's web site. It can be accessed at [place].org If you notice swelling in your hand, arm or breast at any time following surgery (even if it is many years from now), please contact your doctor or physical therapist to discuss this.  Lymphedema can be treated at any time but it is easier for you if it is treated early on.  If you feel like your shoulder motion is not returning to normal in a reasonable amount of time, please contact your surgeon or physical therapist.   ABC CLASS After Breast Cancer Class  After Breast Cancer Class is a specially designed exercise class to assist you in a safe recover after having breast cancer surgery.  In this class you will learn how to get back to full function whether your drains were just removed or if you had surgery a month ago.  This class is FREE and space is limited. For more information or to register for the next available class, call [phone].  Class Goals  Understand specific stretches to improve the flexibility of you chest and shoulder. Learn ways to safely strengthen your upper body and improve your posture. Understand the warning signs of infection and why you may be at risk for an arm infection. Learn about Lymphedema and prevention.  ** You do not attend this class until after surgery.  Drains must be removed to participate  Patient was instructed today in a home exercise program today for post op shoulder range of motion. These included active assist shoulder flexion in  sitting, scapular retraction, wall walking with shoulder abduction, and hands behind head external rotation.  She was encouraged to do these twice a day, holding 3 seconds and repeating 5 times when permitted by her physician.

## 2021-05-24 ENCOUNTER — Ambulatory Visit: Payer: Self-pay | Admitting: Genetic Counselor

## 2021-05-24 DIAGNOSIS — C50511 Malignant neoplasm of lower-outer quadrant of right female breast: Secondary | ICD-10-CM

## 2021-05-24 DIAGNOSIS — Z170 Estrogen receptor positive status [ER+]: Secondary | ICD-10-CM

## 2021-05-24 NOTE — Progress Notes (Signed)
HPI:   Ms. Lucero was previously seen in the [place] clinic due to a personal and family history of cancer and concerns regarding a hereditary predisposition to cancer. Please refer to our prior cancer genetics clinic note for more information regarding our discussion, assessment and recommendations, at the time. Ms. Slatten recent genetic test results were disclosed to her, as were recommendations warranted by these results. These results and recommendations are discussed in more detail below.  CANCER HISTORY:  Oncology History  Malignant neoplasm of lower-outer quadrant of right breast of female, estrogen receptor positive ([place])  04/23/2021 Initial Diagnosis   Malignant neoplasm of lower-outer quadrant of right breast of female, estrogen receptor positive ([place])   04/28/2021 Cancer Staging   Staging form: Breast, AJCC 8th Edition - Clinical stage from 04/28/2021: Stage IA (cT1b, cN0, cM0, G3, ER+, PR+, HER2-) - Signed by Chauncey Cruel, MD on 04/28/2021 Stage prefix: Initial diagnosis Histologic grading system: 3 grade system    05/05/2021 Genetic Testing   [facility] CustomNext Panel was negative. No pathogenic variants were identified in the 47 genes analyzed. Report date is 05/10/2021.   The CustomNext-Cancer+RNAinsight panel offered by [facility] includes sequencing and rearrangement analysis for the following 47 genes:  APC, ATM, AXIN2, BARD1, BMPR1A, BRCA1, BRCA2, BRIP1, CDH1, CDK4, CDKN2A, CHEK2, DICER1, EPCAM, GREM1, HOXB13, MEN1, MLH1, MSH2, MSH3, MSH6, MUTYH, NBN, NF1, NF2, NTHL1, PALB2, PMS2, POLD1, POLE, PTEN, RAD51C, RAD51D, RECQL, RET, SDHA, SDHAF2, SDHB, SDHC, SDHD, SMAD4, SMARCA4, STK11, TP53, TSC1, TSC2, and VHL.  RNA data is routinely analyzed for use in variant interpretation for all genes.      FAMILY HISTORY:  We obtained a detailed, 4-generation family history.  Significant diagnoses are listed below:      Family History  Problem Relation Age of  Onset   Cancer Mother 13        Uterine   Breast cancer Mother 52   Cancer Father 5        Lung   Breast cancer Sister 13   Prostate cancer Brother 48   Lung cancer Brother 48   Thyroid cancer Brother          dx. early 28s            Ms. Lawal's sister has a personal history of breast cancer diagnosed at 37 that metastasized, she died at 90. Her brother was diagnosed with prostate cancer at 8, he had radiation as his treatment. Her second brother was diagnosed with thyroid cancer in his early 41s and lung cancer at 45, he is deceased. Ms. Bickley mother was diagnosed with breast cancer at 68 and uterine cancer at 65, she died at 18. Her father was diagnosed with lung cancer at 66, he smoked, died at 15.    Ms. Stotler is unaware of previous family history of genetic testing for hereditary cancer risks. She reports that she personally had negative BRCA1/2 genetic testing about 5-6 years ago. There no reported Ashkenazi Jewish ancestry.   GENETIC TEST RESULTS:  The [facility] CustomNext Panel found no pathogenic mutations.   The CustomNext-Cancer+RNAinsight panel offered by [facility] includes sequencing and rearrangement analysis for the following 47 genes:  APC, ATM, AXIN2, BARD1, BMPR1A, BRCA1, BRCA2, BRIP1, CDH1, CDK4, CDKN2A, CHEK2, DICER1, EPCAM, GREM1, HOXB13, MEN1, MLH1, MSH2, MSH3, MSH6, MUTYH, NBN, NF1, NF2, NTHL1, PALB2, PMS2, POLD1, POLE, PTEN, RAD51C, RAD51D, RECQL, RET, SDHA, SDHAF2, SDHB, SDHC, SDHD, SMAD4, SMARCA4, STK11, TP53, TSC1, TSC2, and VHL.  RNA data is  routinely analyzed for use in variant interpretation for all genes.    The test report has been scanned into EPIC and is located under the Molecular Pathology section of the Results Review tab.  A portion of the result report is included below for reference. Genetic testing reported out on 05/10/2021.        Even though a pathogenic variant was not identified, possible explanations for the cancer in the family  may include: There may be no hereditary risk for cancer in the family. The cancers in Ms. Metzer and/or her family may be due to other genetic or environmental factors. There may be a gene mutation in one of these genes that current testing methods cannot detect, but that chance is small. There could be another gene that has not yet been discovered, or that we have not yet tested, that is responsible for the cancer diagnoses in the family.  It is also possible there is a hereditary cause for the cancer in the family that Ms. Casstevens did not inherit.  Therefore, it is important to remain in touch with cancer genetics in the future so that we can continue to offer Ms. Wiegert the most up to date genetic testing.   ADDITIONAL GENETIC TESTING:  We discussed with Ms. Dower that her genetic testing was fairly extensive.  If there are genes identified to increase cancer risk that can be analyzed in the future, we would be happy to discuss and coordinate this testing at that time.    CANCER SCREENING RECOMMENDATIONS:  Ms. Maron test result is considered negative (normal).  This means that we have not identified a hereditary cause for her personal and family history of cancer at this time.  An individual's cancer risk and medical management are not determined by genetic test results alone. Overall cancer risk assessment incorporates additional factors, including personal medical history, family history, and any available genetic information that may result in a personalized plan for cancer prevention and surveillance. Therefore, it is recommended she continue to follow the cancer management and screening guidelines provided by her oncology and primary healthcare provider.  RECOMMENDATIONS FOR FAMILY MEMBERS:   Since she did not inherit a mutation in a cancer predisposition gene included on this panel, her children could not have inherited a mutation from her in one of these genes. Individuals in this  family might be at some increased risk of developing cancer, over the general population risk, due to the family history of cancer. We recommend women in this family have a yearly mammogram beginning at age 78, or 19 years younger than the earliest onset of cancer, an annual clinical breast exam, and perform monthly breast self-exams.  Other members of the family may still carry a pathogenic variant in one of these genes that Ms. Kunka did not inherit. Based on the family history, we recommend her siblings consider genetic counseling and testing.   FOLLOW-UP:  Cancer genetics is a rapidly advancing field and it is possible that new genetic tests will be appropriate for her and/or her family members in the future. We encouraged her to remain in contact with cancer genetics on an annual basis so we can update her personal and family histories and let her know of advances in cancer genetics that may benefit this family.   Our contact number was provided. Ms. Burack questions were answered to her satisfaction, and she knows she is welcome to call us at anytime with additional questions or concerns.   Mel Almond Lee Kalt,  MS, [facility] Genetic Counselor [email or web address] (P) [phone]

## 2021-05-27 ENCOUNTER — Other Ambulatory Visit: Payer: Self-pay

## 2021-05-27 ENCOUNTER — Ambulatory Visit
Admission: RE | Admit: 2021-05-27 | Discharge: 2021-05-27 | Disposition: A | Discharge status: Home / Self Care | Payer: Medicare PPO | Source: Ambulatory Visit | Attending: Surgery | Admitting: Surgery

## 2021-05-27 DIAGNOSIS — Z853 Personal history of malignant neoplasm of breast: Secondary | ICD-10-CM

## 2021-05-27 DIAGNOSIS — C50911 Malignant neoplasm of unspecified site of right female breast: Secondary | ICD-10-CM | POA: Diagnosis not present

## 2021-05-30 NOTE — H&P (Signed)
PROVIDER:  Beverlee Nims, MD   MRN: [id number] DOB: 02/11/1951  Subjective  Chief Complaint: Breast Cancer     History of Present Illness: Janice Dudley is a 70 y.o. female who is seen  as an office consultation for evaluation of Breast Cancer  .     This is a 70 year old female who was found on recent screening mammography to have a 7 mm mass in the right breast at the 7 o'clock position.  She underwent a biopsy showing invasive ductal carcinoma.  It was greater than 95% ER positive.  It was moderately PR positive, HER2 negative, Ki-67 15%.  She had a benign biopsy of the left breast yesterday.  She is otherwise without complaints.  Ultrasound of the axilla was negative for adenopathy.  She does have a family history of breast cancer and uterine cancer.  She has had no previous events.  She has no cardiopulmonary issues   Review of Systems: A complete review of systems was obtained from the patient.  I have reviewed this information and discussed as appropriate with the patient.  See HPI as well for other ROS.   Review of Systems  All other systems reviewed and are negative.     Medical History: Past Medical History      Past Medical History:  Diagnosis Date   Arthritis             Patient Active Problem List  Diagnosis   Arthritis   Malignant neoplasm of lower-outer quadrant of right breast of female, estrogen receptor positive (CMS-HCC)      Past Surgical History       Past Surgical History:  Procedure Laterality Date   CESAREAN SECTION N/A      1982/1983   Left Eye Surgery Left      2022        Allergies       Allergies  Allergen Reactions   Topiramate Other (See Comments)      Bilateral leg pain Bilateral leg pain                Current Outpatient Medications on File Prior to Visit  Medication Sig Dispense Refill   buPROPion (WELLBUTRIN SR) 150 MG SR tablet bupropion HCl SR 150 mg tablet,12 hr sustained-release       hydroCHLOROthiazide  (HYDRODIURIL) 12.5 MG tablet hydrochlorothiazide 12.5 mg tablet       traZODone (DESYREL) 50 MG tablet trazodone 50 mg tablet  TAKE 1 TO 2 TABLETS BY MOUTH EVERY NIGHT 1 HOUR BEFORE BEDTIME AS NEEDED        No current facility-administered medications on file prior to visit.      Family History       Family History  Problem Relation Age of Onset   Prostate cancer Brother          Social History        Tobacco Use  Smoking Status Former Smoker   Types: Cigarettes  Smokeless Tobacco Never Used  Tobacco Comment    quit smoking in the 1980's      Social History  Social History         Socioeconomic History   Marital status: Unknown  Tobacco Use   Smoking status: Former Smoker      Types: Cigarettes   Smokeless tobacco: Never Used   Tobacco comment: quit smoking in the 1980's  Vaping Use   Vaping Use: Never used  Substance and Sexual Activity   Alcohol use:  Yes      Comment: 2 x weekly   Drug use: Never        Objective:   Vitals:      BP: 114/62  Pulse: 70  Resp: 18  Temp: 97.9 F (36.6 C)  SpO2: 100%     Body mass index is 34.62 kg/m.      Wt Readings from Last 3 Encounters:  04/28/21 183 lb 3.2 oz (83.1 kg)  12/25/20 189 lb (85.7 kg)  05/26/20 197 lb (89.4 kg)     Physical Exam    She appears well on exam   There are no palpable breast masses.  The nipple areolar complex is normal.  There is no axillary adenopathy  Lungs clear CV RRR Abdomen soft, NT Neuro grossly intact   Labs, Imaging and Diagnostic Testing: I have reviewed her mammograms, ultrasound, and pathology results   Assessment and Plan:  Diagnoses and all orders for this visit:   Invasive ductal carcinoma of breast, right (CMS-HCC)       We have discussed her this morning in a multidisciplinary breast cancer conference.  He has a right breast invasive ductal carcinoma.  We discussed options including breast conservation with lumpectomy versus mastectomy.  We also discussed  sentinel node biopsy.  She is interested in breast conservation. Discussed proceeding with a radioactive seed guided right breast lumpectomy and sentinel node biopsy.  We discussed the procedure in detail.  We discussed the risks which includes but is not limited to bleeding, infection, the need for further procedures if the margins or nodes are positive, cardiopulmonary issues, postoperative swelling, postoperative recovery, etc.  They understand and wish to proceed with surgery which will be scheduled

## 2021-05-31 ENCOUNTER — Ambulatory Visit
Admission: RE | Admit: 2021-05-31 | Discharge: 2021-05-31 | Disposition: A | Discharge status: Home / Self Care | Payer: Medicare PPO | Source: Ambulatory Visit | Attending: Surgery | Admitting: Surgery

## 2021-05-31 ENCOUNTER — Encounter (HOSPITAL_BASED_OUTPATIENT_CLINIC_OR_DEPARTMENT_OTHER): Payer: Self-pay | Admitting: Surgery

## 2021-05-31 ENCOUNTER — Encounter (HOSPITAL_BASED_OUTPATIENT_CLINIC_OR_DEPARTMENT_OTHER)
Admission: RE | Disposition: A | Discharge status: Home / Self Care | Payer: Self-pay | Source: Home / Self Care | Attending: Surgery

## 2021-05-31 ENCOUNTER — Ambulatory Visit (HOSPITAL_BASED_OUTPATIENT_CLINIC_OR_DEPARTMENT_OTHER)
Admission: RE | Admit: 2021-05-31 | Discharge: 2021-05-31 | Disposition: A | Discharge status: Home / Self Care | Payer: Medicare PPO | Attending: Surgery | Admitting: Surgery

## 2021-05-31 ENCOUNTER — Other Ambulatory Visit: Payer: Self-pay

## 2021-05-31 ENCOUNTER — Ambulatory Visit (HOSPITAL_BASED_OUTPATIENT_CLINIC_OR_DEPARTMENT_OTHER): Payer: Medicare PPO | Admitting: Anesthesiology

## 2021-05-31 DIAGNOSIS — Z170 Estrogen receptor positive status [ER+]: Secondary | ICD-10-CM | POA: Diagnosis not present

## 2021-05-31 DIAGNOSIS — C50511 Malignant neoplasm of lower-outer quadrant of right female breast: Secondary | ICD-10-CM | POA: Diagnosis not present

## 2021-05-31 DIAGNOSIS — Z853 Personal history of malignant neoplasm of breast: Secondary | ICD-10-CM

## 2021-05-31 DIAGNOSIS — Z87891 Personal history of nicotine dependence: Secondary | ICD-10-CM | POA: Insufficient documentation

## 2021-05-31 DIAGNOSIS — Z803 Family history of malignant neoplasm of breast: Secondary | ICD-10-CM | POA: Insufficient documentation

## 2021-05-31 DIAGNOSIS — G8918 Other acute postprocedural pain: Secondary | ICD-10-CM | POA: Diagnosis not present

## 2021-05-31 DIAGNOSIS — E782 Mixed hyperlipidemia: Secondary | ICD-10-CM | POA: Diagnosis not present

## 2021-05-31 DIAGNOSIS — R600 Localized edema: Secondary | ICD-10-CM

## 2021-05-31 DIAGNOSIS — Z8049 Family history of malignant neoplasm of other genital organs: Secondary | ICD-10-CM | POA: Diagnosis not present

## 2021-05-31 DIAGNOSIS — R928 Other abnormal and inconclusive findings on diagnostic imaging of breast: Secondary | ICD-10-CM | POA: Diagnosis not present

## 2021-05-31 HISTORY — PX: BREAST LUMPECTOMY WITH RADIOACTIVE SEED AND SENTINEL LYMPH NODE BIOPSY: SHX6550

## 2021-05-31 HISTORY — PX: BREAST LUMPECTOMY: SHX2

## 2021-05-31 SURGERY — BREAST LUMPECTOMY WITH RADIOACTIVE SEED AND SENTINEL LYMPH NODE BIOPSY
Anesthesia: General | Site: Breast | Laterality: Right

## 2021-05-31 MED ORDER — ACETAMINOPHEN 500 MG PO TABS
ORAL_TABLET | ORAL | Status: AC
  Filled 2021-05-31: qty 2

## 2021-05-31 MED ORDER — FENTANYL CITRATE (PF) 100 MCG/2ML IJ SOLN
INTRAMUSCULAR | Status: AC
  Filled 2021-05-31: qty 2

## 2021-05-31 MED ORDER — DEXAMETHASONE SODIUM PHOSPHATE 4 MG/ML IJ SOLN
INTRAMUSCULAR | Status: DC | PRN
  Administered 2021-05-31: 4 mg via INTRAVENOUS

## 2021-05-31 MED ORDER — CHLORHEXIDINE GLUCONATE CLOTH 2 % EX PADS: 6.0000 | MEDICATED_PAD | Freq: Once | CUTANEOUS | Status: DC

## 2021-05-31 MED ORDER — LIDOCAINE HCL 1 % IJ SOLN
INTRAMUSCULAR | Status: DC | PRN
  Administered 2021-05-31: 60 mg via INTRADERMAL

## 2021-05-31 MED ORDER — ACETAMINOPHEN 500 MG PO TABS
1000.0000 mg | ORAL_TABLET | ORAL | Status: AC
  Administered 2021-05-31: 1000 mg via ORAL

## 2021-05-31 MED ORDER — PROPOFOL 10 MG/ML IV BOLUS
INTRAVENOUS | Status: AC
  Filled 2021-05-31: qty 20

## 2021-05-31 MED ORDER — MIDAZOLAM HCL 2 MG/2ML IJ SOLN
2.0000 mg | Freq: Once | INTRAMUSCULAR | Status: AC
  Administered 2021-05-31: 1 mg via INTRAVENOUS

## 2021-05-31 MED ORDER — MAGTRACE LYMPHATIC TRACER
INTRAMUSCULAR | Status: DC | PRN
  Administered 2021-05-31: 2 mL via INTRAMUSCULAR

## 2021-05-31 MED ORDER — FENTANYL CITRATE (PF) 100 MCG/2ML IJ SOLN
100.0000 ug | Freq: Once | INTRAMUSCULAR | Status: AC
  Administered 2021-05-31: 50 ug via INTRAVENOUS

## 2021-05-31 MED ORDER — MIDAZOLAM HCL 2 MG/2ML IJ SOLN
INTRAMUSCULAR | Status: AC
  Filled 2021-05-31: qty 2

## 2021-05-31 MED ORDER — PROPOFOL 10 MG/ML IV BOLUS
INTRAVENOUS | Status: DC | PRN
  Administered 2021-05-31: 200 mg via INTRAVENOUS

## 2021-05-31 MED ORDER — OXYCODONE HCL 5 MG/5ML PO SOLN: 5.0000 mg | Freq: Once | ORAL | Status: DC | PRN

## 2021-05-31 MED ORDER — ONDANSETRON HCL 4 MG/2ML IJ SOLN: 4.0000 mg | Freq: Four times a day (QID) | INTRAMUSCULAR | Status: DC | PRN

## 2021-05-31 MED ORDER — CEFAZOLIN SODIUM-DEXTROSE 2-4 GM/100ML-% IV SOLN
INTRAVENOUS | Status: AC
  Filled 2021-05-31: qty 100

## 2021-05-31 MED ORDER — FENTANYL CITRATE (PF) 100 MCG/2ML IJ SOLN: 25.0000 ug | INTRAMUSCULAR | Status: DC | PRN

## 2021-05-31 MED ORDER — ONDANSETRON HCL 4 MG/2ML IJ SOLN
INTRAMUSCULAR | Status: AC
  Filled 2021-05-31: qty 2

## 2021-05-31 MED ORDER — CEFAZOLIN SODIUM-DEXTROSE 2-4 GM/100ML-% IV SOLN
2.0000 g | INTRAVENOUS | Status: AC
  Administered 2021-05-31: 2 g via INTRAVENOUS

## 2021-05-31 MED ORDER — OXYCODONE HCL 5 MG PO TABS: 5.0000 mg | ORAL_TABLET | Freq: Once | ORAL | Status: DC | PRN

## 2021-05-31 MED ORDER — LACTATED RINGERS IV SOLN: INTRAVENOUS | Status: DC

## 2021-05-31 MED ORDER — TRAMADOL HCL 50 MG PO TABS: 50.0000 mg | ORAL_TABLET | Freq: Four times a day (QID) | ORAL | 0 refills | Status: DC | PRN

## 2021-05-31 MED ORDER — BUPIVACAINE-EPINEPHRINE (PF) 0.5% -1:200000 IJ SOLN
INTRAMUSCULAR | Status: DC | PRN
  Administered 2021-05-31: 20 mL via PERINEURAL

## 2021-05-31 MED ORDER — EPHEDRINE 5 MG/ML INJ
INTRAVENOUS | Status: AC
  Filled 2021-05-31: qty 5

## 2021-05-31 MED ORDER — FENTANYL CITRATE (PF) 100 MCG/2ML IJ SOLN
INTRAMUSCULAR | Status: DC | PRN
  Administered 2021-05-31: 25 ug via INTRAVENOUS
  Administered 2021-05-31: 50 ug via INTRAVENOUS

## 2021-05-31 MED ORDER — ONDANSETRON HCL 4 MG/2ML IJ SOLN
INTRAMUSCULAR | Status: DC | PRN
  Administered 2021-05-31: 4 mg via INTRAVENOUS

## 2021-05-31 MED ORDER — BUPIVACAINE-EPINEPHRINE 0.5% -1:200000 IJ SOLN
INTRAMUSCULAR | Status: DC | PRN
  Administered 2021-05-31: 12 mL

## 2021-05-31 SURGICAL SUPPLY — 49 items
ADH SKN CLS APL DERMABOND .7 (GAUZE/BANDAGES/DRESSINGS) ×1
APL PRP STRL LF DISP 70% ISPRP (MISCELLANEOUS) ×1
APPLIER CLIP 9.375 MED OPEN (MISCELLANEOUS) ×2
APR CLP MED 9.3 20 MLT OPN (MISCELLANEOUS) ×1
BINDER BREAST XLRG (GAUZE/BANDAGES/DRESSINGS) ×1 IMPLANT
BLADE SURG 15 STRL LF DISP TIS (BLADE) ×1 IMPLANT
BLADE SURG 15 STRL SS (BLADE) ×2
CANISTER SUCT 1200ML W/VALVE (MISCELLANEOUS) IMPLANT
CHLORAPREP W/TINT 26 (MISCELLANEOUS) ×2 IMPLANT
CLIP APPLIE 9.375 MED OPEN (MISCELLANEOUS) ×1 IMPLANT
COVER BACK TABLE 60X90IN (DRAPES) ×2 IMPLANT
COVER MAYO STAND STRL (DRAPES) ×2 IMPLANT
COVER PROBE W GEL 5X96 (DRAPES) ×3 IMPLANT
DECANTER SPIKE VIAL GLASS SM (MISCELLANEOUS) IMPLANT
DERMABOND ADVANCED (GAUZE/BANDAGES/DRESSINGS) ×1
DERMABOND ADVANCED .7 DNX12 (GAUZE/BANDAGES/DRESSINGS) ×1 IMPLANT
DRAPE LAPAROSCOPIC ABDOMINAL (DRAPES) ×2 IMPLANT
DRAPE UTILITY XL STRL (DRAPES) ×2 IMPLANT
ELECT REM PT RETURN 9FT ADLT (ELECTROSURGICAL) ×2
ELECTRODE REM PT RTRN 9FT ADLT (ELECTROSURGICAL) ×1 IMPLANT
GAUZE SPONGE 4X4 12PLY STRL LF (GAUZE/BANDAGES/DRESSINGS) IMPLANT
GLOVE SURG POLYISO LF SZ6.5 (GLOVE) ×1 IMPLANT
GLOVE SURG SIGNA 7.5 PF LTX (GLOVE) ×2 IMPLANT
GLOVE SURG UNDER POLY LF SZ6.5 (GLOVE) ×1 IMPLANT
GLOVE SURG UNDER POLY LF SZ7 (GLOVE) ×1 IMPLANT
GOWN STRL REUS W/ TWL LRG LVL3 (GOWN DISPOSABLE) ×1 IMPLANT
GOWN STRL REUS W/ TWL XL LVL3 (GOWN DISPOSABLE) ×1 IMPLANT
GOWN STRL REUS W/TWL LRG LVL3 (GOWN DISPOSABLE) ×2
GOWN STRL REUS W/TWL XL LVL3 (GOWN DISPOSABLE) ×2
KIT MARKER MARGIN INK (KITS) ×2 IMPLANT
NDL HYPO 25X1 1.5 SAFETY (NEEDLE) ×1 IMPLANT
NDL SAFETY ECLIPSE 18X1.5 (NEEDLE) ×1 IMPLANT
NEEDLE HYPO 18GX1.5 SHARP (NEEDLE) ×2
NEEDLE HYPO 25X1 1.5 SAFETY (NEEDLE) ×2 IMPLANT
NS IRRIG 1000ML POUR BTL (IV SOLUTION) ×2 IMPLANT
PACK BASIN DAY SURGERY FS (CUSTOM PROCEDURE TRAY) ×2 IMPLANT
PENCIL SMOKE EVACUATOR (MISCELLANEOUS) ×2 IMPLANT
SLEEVE SCD COMPRESS KNEE MED (STOCKING) ×2 IMPLANT
SPONGE T-LAP 4X18 ~~LOC~~+RFID (SPONGE) ×2 IMPLANT
SUT MNCRL AB 4-0 PS2 18 (SUTURE) ×3 IMPLANT
SUT SILK 2 0 SH (SUTURE) IMPLANT
SUT VIC AB 3-0 SH 27 (SUTURE) ×4
SUT VIC AB 3-0 SH 27X BRD (SUTURE) ×1 IMPLANT
SYR CONTROL 10ML LL (SYRINGE) ×2 IMPLANT
TOWEL GREEN STERILE FF (TOWEL DISPOSABLE) ×2 IMPLANT
TRACER MAGTRACE VIAL (MISCELLANEOUS) ×1 IMPLANT
TRAY FAXITRON CT DISP (TRAY / TRAY PROCEDURE) ×2 IMPLANT
TUBE CONNECTING 20X1/4 (TUBING) IMPLANT
YANKAUER SUCT BULB TIP NO VENT (SUCTIONS) IMPLANT

## 2021-05-31 NOTE — Progress Notes (Signed)
  Assisted Dr. Hodierne with right, ultrasound guided, pectoralis block. Side rails up, monitors on throughout procedure. See vital signs in flow sheet. Tolerated Procedure well. 

## 2021-05-31 NOTE — Anesthesia Postprocedure Evaluation (Signed)
Anesthesia Post Note  Patient: Jezabel Lecker  Procedure(s) Performed: RIGHT BREAST LUMPECTOMY WITH RADIOACTIVE SEED AND SENTINEL LYMPH NODE BIOPSY (Right: Breast)     Patient location during evaluation: PACU Anesthesia Type: General Level of consciousness: awake and alert Pain management: pain level controlled Vital Signs Assessment: post-procedure vital signs reviewed and stable Respiratory status: spontaneous breathing, nonlabored ventilation, respiratory function stable and patient connected to nasal cannula oxygen Cardiovascular status: blood pressure returned to baseline and stable Postop Assessment: no apparent nausea or vomiting Anesthetic complications: no   No notable events documented.  Last Vitals:  Vitals:   05/31/21 1000 05/31/21 1034  BP: 121/69 134/66  Pulse: 68 69  Resp: 12 18  Temp:  (!) 36.4 C  SpO2: 95% 95%    Last Pain:  Vitals:   05/31/21 1034  TempSrc: Oral  PainSc: 0-No pain                 Lexa Coronado S

## 2021-05-31 NOTE — Transfer of Care (Signed)
Immediate Anesthesia Transfer of Care Note  Patient: Janice Dudley  Procedure(s) Performed: RIGHT BREAST LUMPECTOMY WITH RADIOACTIVE SEED AND SENTINEL LYMPH NODE BIOPSY (Right: Breast)  Patient Location: PACU  Anesthesia Type:General  Level of Consciousness: awake, alert , oriented and patient cooperative  Airway & Oxygen Therapy: Patient Spontanous Breathing and Patient connected to face mask oxygen  Post-op Assessment: Report given to RN and Post -op Vital signs reviewed and stable  Post vital signs: Reviewed and stable  Last Vitals:  Vitals Value Taken Time  BP 115/78 05/31/21 0932  Temp    Pulse 69 05/31/21 0939  Resp 15 05/31/21 0939  SpO2 100 % 05/31/21 0939  Vitals shown include unvalidated device data.  Last Pain:  Vitals:   05/31/21 0719  TempSrc: Oral  PainSc: 0-No pain      Patients Stated Pain Goal: 3 (01/74/94 4967)  Complications: No notable events documented.

## 2021-05-31 NOTE — Op Note (Signed)
   Janice Dudley 05/31/2021   Pre-op Diagnosis: RIGHT BREAST CANCER     Post-op Diagnosis: same  Procedure(s): RIGHT BREAST LUMPECTOMY WITH RADIOACTIVE SEED AND DEEP RIGHT AXILLARY SENTINEL LYMPH NODE BIOPSY  Surgeon(s): Coralie Keens, MD Carlena Hurl, PA-C  Anesthesia: General  Staff:  Circulator: Izora Ribas, RN Scrub Person: Lorenza Burton, CST  Estimated Blood Loss: Minimal               Specimens: sent to path  Indications: This is a 70 year old female was recently found to have a small mass in the right breast on screening mammography.  Biopsy performed showed invasive ductal carcinoma.  The decision was made to proceed with a radioactive seed guided right breast lumpectomy and right axillary sentinel lymph node biopsy.  Procedure: The patient was identified in the preoperative holding area.  After obtaining consent under sterile technique I injected lidocaine underneath the nipple areolar complex and then injected mag trace.  The patient was then taken to the operating room.  She is placed upon the operating table general anesthesia was induced.  Her right breast was massaged.  Her right breast and axilla were then prepped and draped in usual sterile fashion.  Using the neoprobe the radioactive seed was identified at the 7 o'clock position of the right breast below the nipple areolar complex.  I anesthetized the lateral edge and lower edge of the areola with Marcaine and then made a circumareolar incision with a scalpel.  With the aid of neoprobe I then dissected into the breast tissue staying widely around the radioactive seed.  I took this to near the chest wall and then undermined the skin in order to remove the specimen.  Once it was removed I marked all margins with marker paint.  An x-ray was performed confirming the radioactive seed and tissue marker were in the specimen.  The lumpectomy specimen was then sent to pathology for evaluation.  I achieved  hemostasis with the cautery.  I placed surgical clips around the periphery of the biopsy cavity. Next, with the aid of the detector for the mag trace, identified an area in the right axilla.  I anesthetized skin with Marcaine made incision with a scalpel.  I then dissected down to the deep axillary tissue with electrocautery.  With the aid of the mag trace probe identified a group of lymph nodes and fatty tissue which I removed with the cautery.  I did clip to small vessels with surgical clips.  I again with the the probe I identified another area that appeared consistent with a small lymph node as well and sent his pathology.  No other increased uptake was identified.  I achieved hemostasis with cautery.  Both incisions were then closed with interrupted 3-0 Vicryl sutures and running 4-0 Monocryl sutures.  Dermabond was then applied.  The patient tolerated the procedure well.  All the counts were correct at the end of the procedure.  The patient was then extubated in the operating room and taken in a stable condition to the recovery room          Coralie Keens   Date: 05/31/2021  Time: 9:21 AM

## 2021-05-31 NOTE — Anesthesia Procedure Notes (Signed)
Procedure Name: LMA Insertion Date/Time: 05/31/2021 8:29 AM Performed by: Garrel Ridgel, CRNA Pre-anesthesia Checklist: Patient identified, Emergency Drugs available, Suction available, Patient being monitored and Timeout performed Patient Re-evaluated:Patient Re-evaluated prior to induction Oxygen Delivery Method: Circle system utilized Preoxygenation: Pre-oxygenation with 100% oxygen Induction Type: IV induction Ventilation: Mask ventilation without difficulty LMA: LMA inserted LMA Size: 4.0 Number of attempts: 1 Tube secured with: Tape Dental Injury: Teeth and Oropharynx as per pre-operative assessment

## 2021-05-31 NOTE — Discharge Instructions (Addendum)
Next dose of Tylenol 1:30 today if needed  [employer] Phone Number [phone]  BREAST BIOPSY/ PARTIAL MASTECTOMY: POST OP INSTRUCTIONS  Always review your discharge instruction sheet given to you by the facility where your surgery was performed.  IF YOU HAVE DISABILITY OR FAMILY LEAVE FORMS, YOU MUST BRING THEM TO THE OFFICE FOR PROCESSING.  DO NOT GIVE THEM TO YOUR DOCTOR.  A prescription for pain medication may be given to you upon discharge.  Take your pain medication as prescribed, if needed.  If narcotic pain medicine is not needed, then you may take acetaminophen (Tylenol) or ibuprofen (Advil) as needed. Take your usually prescribed medications unless otherwise directed If you need a refill on your pain medication, please contact your pharmacy.  They will contact our office to request authorization.  Prescriptions will not be filled after 5pm or on week-ends. You should eat very light the first 24 hours after surgery, such as soup, crackers, pudding, etc.  Resume your normal diet the day after surgery. Most patients will experience some swelling and bruising in the breast.  Ice packs and a good support bra will help.  Swelling and bruising can take several days to resolve.  It is common to experience some constipation if taking pain medication after surgery.  Increasing fluid intake and taking a stool softener will usually help or prevent this problem from occurring.  A mild laxative (Milk of Magnesia or Miralax) should be taken according to package directions if there are no bowel movements after 48 hours. Unless discharge instructions indicate otherwise, you may remove your bandages 24-48 hours after surgery, and you may shower at that time.  You may have steri-strips (small skin tapes) in place directly over the incision.  These strips should be left on the skin for 7-10 days.  If your surgeon used skin glue on the incision, you may shower in 24 hours.  The glue  will flake off over the next 2-3 weeks.  Any sutures or staples will be removed at the office during your follow-up visit. ACTIVITIES:  You may resume regular daily activities (gradually increasing) beginning the next day.  Wearing a good support bra or sports bra minimizes pain and swelling.  You may have sexual intercourse when it is comfortable. You may drive when you no longer are taking prescription pain medication, you can comfortably wear a seatbelt, and you can safely maneuver your car and apply brakes. RETURN TO WORK:  ______________________________________________________________________________________ Dennis Bast should see your doctor in the office for a follow-up appointment approximately two weeks after your surgery.  Your doctor's nurse will typically make your follow-up appointment when she calls you with your pathology report.  Expect your pathology report 2-3 business days after your surgery.  You may call to check if you do not hear from [place] after three days. OTHER INSTRUCTIONS: OK TO SHOWER STARTING TOMORROW ICE PACK, TYLENOL (next dose not until 1:30 today if needed) , AND IBUPROFEN ALSO FOR PAIN NO VIGOROUS ACTIVITY FOR ONE WEEK _______________________________________________________________________________________________ _____________________________________________________________________________________________________________________________________ _____________________________________________________________________________________________________________________________________ _____________________________________________________________________________________________________________________________________  WHEN TO CALL YOUR DOCTOR: Fever over 101.0 Nausea and/or vomiting. Extreme swelling or bruising. Continued bleeding from incision. Increased pain, redness, or drainage from the incision.  The clinic staff is available to answer your questions during regular business  hours.  Please don't hesitate to call and ask to speak to one of the nurses for clinical concerns.  If you have a medical emergency, go to the nearest emergency room or call 911.  A  surgeon from [facility] is always on call at the hospital.  For further questions, please visit [email or web address]

## 2021-05-31 NOTE — Interval H&P Note (Signed)
History and Physical Interval Note: no change in H and P  05/31/2021 7:12 AM  Janice Dudley  has presented today for surgery, with the diagnosis of RIGHT BREAST CANCER.  The various methods of treatment have been discussed with the patient and family. After consideration of risks, benefits and other options for treatment, the patient has consented to  Procedure(s): RIGHT BREAST LUMPECTOMY WITH RADIOACTIVE SEED AND SENTINEL LYMPH NODE BIOPSY (Right) as a surgical intervention.  The patient's history has been reviewed, patient examined, no change in status, stable for surgery.  I have reviewed the patient's chart and labs.  Questions were answered to the patient's satisfaction.     Coralie Keens

## 2021-05-31 NOTE — Anesthesia Procedure Notes (Signed)
Anesthesia Regional Block: Pectoralis block   Pre-Anesthetic Checklist: , timeout performed,  Correct Patient, Correct Site, Correct Laterality,  Correct Procedure, Correct Position, site marked,  Risks and benefits discussed,  Surgical consent,  Pre-op evaluation,  At surgeon's request and post-op pain management  Laterality: Right  Prep: chloraprep       Needles:  Injection technique: Single-shot  Needle Type: Echogenic Needle     Needle Length: 9cm  Needle Gauge: 21     Additional Needles:   Narrative:  Start time: 05/31/2021 8:10 AM End time: 05/31/2021 8:18 AM Injection made incrementally with aspirations every 5 mL.  Performed by: Personally  Anesthesiologist: Albertha Ghee, MD  Additional Notes: Pt tolerated the procedure well.

## 2021-05-31 NOTE — Anesthesia Preprocedure Evaluation (Signed)
Anesthesia Evaluation  Patient identified by MRN, date of birth, ID band Patient awake    Reviewed: Allergy & Precautions, H&P , NPO status , Patient's Chart, lab work & pertinent test results  Airway Mallampati: II   Neck ROM: full    Dental   Pulmonary former smoker,    breath sounds clear to auscultation       Cardiovascular negative cardio ROS   Rhythm:regular Rate:Normal     Neuro/Psych    GI/Hepatic   Endo/Other    Renal/GU      Musculoskeletal  (+) Arthritis ,   Abdominal   Peds  Hematology   Anesthesia Other Findings   Reproductive/Obstetrics                             Anesthesia Physical Anesthesia Plan  ASA: 2  Anesthesia Plan: General   Post-op Pain Management:  Regional for Post-op pain   Induction: Intravenous  PONV Risk Score and Plan: 3 and Ondansetron, Dexamethasone, Midazolam and Treatment may vary due to age or medical condition  Airway Management Planned: LMA  Additional Equipment:   Intra-op Plan:   Post-operative Plan: Extubation in OR  Informed Consent: I have reviewed the patients History and Physical, chart, labs and discussed the procedure including the risks, benefits and alternatives for the proposed anesthesia with the patient or authorized representative who has indicated his/her understanding and acceptance.     Dental advisory given  Plan Discussed with: CRNA, Anesthesiologist and Surgeon  Anesthesia Plan Comments:         Anesthesia Quick Evaluation

## 2021-06-04 LAB — SURGICAL PATHOLOGY

## 2021-06-07 ENCOUNTER — Encounter: Payer: Self-pay | Admitting: *Deleted

## 2021-06-14 ENCOUNTER — Encounter: Payer: Self-pay | Admitting: Physician Assistant

## 2021-06-14 ENCOUNTER — Inpatient Hospital Stay: Payer: Medicare PPO | Attending: Oncology | Admitting: Oncology

## 2021-06-14 ENCOUNTER — Other Ambulatory Visit: Payer: Self-pay

## 2021-06-14 VITALS — BP 140/69 | HR 76 | Temp 97.7°F | Resp 18 | Ht 61.0 in | Wt 186.0 lb

## 2021-06-14 DIAGNOSIS — Z79899 Other long term (current) drug therapy: Secondary | ICD-10-CM | POA: Diagnosis not present

## 2021-06-14 DIAGNOSIS — Z79811 Long term (current) use of aromatase inhibitors: Secondary | ICD-10-CM | POA: Insufficient documentation

## 2021-06-14 DIAGNOSIS — Z7982 Long term (current) use of aspirin: Secondary | ICD-10-CM | POA: Insufficient documentation

## 2021-06-14 DIAGNOSIS — C50511 Malignant neoplasm of lower-outer quadrant of right female breast: Secondary | ICD-10-CM | POA: Diagnosis not present

## 2021-06-14 DIAGNOSIS — Z87891 Personal history of nicotine dependence: Secondary | ICD-10-CM | POA: Diagnosis not present

## 2021-06-14 DIAGNOSIS — Z170 Estrogen receptor positive status [ER+]: Secondary | ICD-10-CM | POA: Insufficient documentation

## 2021-06-14 MED ORDER — ANASTROZOLE 1 MG PO TABS: 1.0000 mg | ORAL_TABLET | Freq: Every day | ORAL | 4 refills | Status: DC

## 2021-06-14 NOTE — Progress Notes (Signed)
[facility]  [phone] [phone]    ID: Janice Dudley DOB: Mar 18, 1951  MR#: [id number]  YHC#:[id number]  Patient Care Team: Lavada Mesi as PCP - General (Family Medicine) Mauro Kaufmann, RN as Oncology Nurse Navigator Rockwell Germany, RN as Oncology Nurse Navigator Coralie Keens, MD as Consulting Physician (General Surgery) Grantley Savage, [name], MD as Consulting Physician (Oncology) [name], MD as Consulting Physician (Radiation Oncology) [name], MD as Consulting Physician (Obstetrics and Gynecology) Silverio Decamp, MD as Consulting Physician (Sports Medicine) [name], MD as Referring Physician (Specialist) Chauncey Cruel, MD OTHER MD:  CHIEF COMPLAINT: Estrogen receptor positive breast cancer  CURRENT TREATMENT: Anastrozole; adjuvant radiation pending   INTERVAL HISTORY: Janice Dudley returns today for follow up of her estrogen receptor positive breast cancer. She was evaluated in the multidisciplinary breast cancer clinic on 04/28/2021.  She is accompanied by her husband  She underwent genetic testing during clinic. Results were negative.  She proceeded to right lumpectomy on 05/31/2021 under Dr. Ninfa Linden. Pathology from the procedure [id number]) showed: invasive ductal carcinoma, 0.5 cm, grade 2; margins negative for carcinoma.  All three lymph nodes were negative for metastatic carcinoma.  She is already scheduled to meet with Dr. Lisbeth Renshaw but is concerned that they have a month-long trip to [place] planned in January and radiation may not actually start until February.   REVIEW OF SYSTEMS: Janice Dudley did very well with her surgery, with no significant pain bleeding fever or other complications.  She has had an itchy scalp and a visible rash at the base of her neck since the day after surgery.  She does not have a rash elsewhere.  A detailed review of systems today was otherwise  benign   COVID 19 VACCINATION STATUS: Pfizer x3 as of October 2022   HISTORY OF CURRENT ILLNESS: From the original intake note:  [employer] "Janice Dudley" was under every-six-month follow up for a likely-benign left breast mass. She underwent bilateral diagnostic mammography with tomography and bilateral breast ultrasonography at [facility] on 04/12/2021 showing: breast density category B; new indeterminate 7 mm mass in right breast at 7 o'clock; no evidence of right axillary adenopathy; the likely-benign left breast mass at 4 o'clock is stable.  Accordingly on 04/19/2021 she proceeded to biopsy of the right breast area in question. The pathology from this procedure ([id number]) showed: invasive ductal carcinoma, grade 2/3. Prognostic indicators significant for: estrogen receptor, >95% positive with strong staining intensity and progesterone receptor, 40% positive with moderate staining intensity. Proliferation marker Ki67 at 15%. HER2 equivocal by immunohistochemistry (2+), but negative by fluorescent in situ hybridization with a signals ratio 2.02 and 1.89 and number per cell 4.55 and 4.15}.  She also underwent biopsy of the left breast mass on 04/27/2021. Pathology ([id number]) was benign, showing fibrocystic changes including apocrine metaplasia.   Cancer Staging  Malignant neoplasm of lower-outer quadrant of right breast of female, estrogen receptor positive ([place]) Staging form: Breast, AJCC 8th Edition - Clinical stage from 04/28/2021: Stage IA (cT1b, cN0, cM0, G3, ER+, PR+, HER2-) - Signed by Chauncey Cruel, MD on 04/28/2021 Stage prefix: Initial diagnosis Histologic grading system: 3 grade system  The patient's subsequent history is as detailed below.   PAST MEDICAL HISTORY: Past Medical History:  Diagnosis Date   Allergy 1960s, 1970s, 2000   Arthritis    Breast cancer ([place])    Cataract    no surgery yet   Hyperlipidemia  PAST SURGICAL HISTORY: Past  Surgical History:  Procedure Laterality Date   BREAST EXCISIONAL BIOPSY Left 1999   BREAST LUMPECTOMY WITH RADIOACTIVE SEED AND SENTINEL LYMPH NODE BIOPSY Right 05/31/2021   Procedure: RIGHT BREAST LUMPECTOMY WITH RADIOACTIVE SEED AND SENTINEL LYMPH NODE BIOPSY;  Surgeon: Coralie Keens, MD;  Location: [facility];  Service: General;  Laterality: Right;   BREAST SURGERY  1999   Lumpectomy   CESAREAN SECTION  1982/1983   2 times   COLONOSCOPY  2008   EYE SURGERY  01/2021   Vitrectomy   LAPAROSCOPY     abd/tubal ligation   TUBAL LIGATION  1988   VITRECTOMY      FAMILY HISTORY: Family History  Problem Relation Age of Onset   Stroke Mother 33   Heart attack Mother 62   Cancer Mother 66       Uterine   Breast cancer Mother 60   Heart attack Father 15   Cancer Father 61       Lung   Stroke Father 40   Hyperlipidemia Sister    Breast cancer Sister 42   Hyperlipidemia Sister    Thyroid nodules Brother    Hyperlipidemia Brother    Prostate cancer Brother 65   Hyperlipidemia Brother    Lung cancer Brother 50   Thyroid cancer Brother        dx. early 106s   Hyperlipidemia Brother    Anxiety disorder Daughter    Depression Daughter    Colon cancer Neg Hx    Esophageal cancer Neg Hx    Rectal cancer Neg Hx    Her father died at age 56 from [place]. He had a history of lung cancer at age 18. Her mother died at age 84 from uterine cancer. She had a history of breast cancer at age 38 and apparently was treated with tamoxifen. Janice Dudley has three brothers and three sisters. One sister had breast and uterine cancer at age 68 (possibly also treated with tamoxifen), a brother had lung cancer at age 20, and another brother had prostate cancer at age 32.  There is no history of colon cancer in the family   GYNECOLOGIC HISTORY:  No LMP recorded. Patient is postmenopausal. Menarche: 70 years old Age at first live birth: 70 years old Bazine P 2 LMP 2006 Contraceptive: used for 12  years HRT: never used  Hysterectomy? no BSO? no   SOCIAL HISTORY: (updated 04/2021)  Otila "Janice Dudley" is currently retired from working as a Pharmacist, hospital for pre-K to [employer]. Husband [name] is a retired Government social research officer. Daughter [name], age 67, is a Secretary/administrator in [place], [place]. Son [name], age 1, is an MRI tech in [place]. Janice Dudley has three grandchildren. She attends a [employer] in [place].    ADVANCED DIRECTIVES: in place   HEALTH MAINTENANCE: Social History   Tobacco Use   Smoking status: Former    Packs/day: 0.25    Years: 20.00    Pack years: 5.00    Types: Cigarettes    Quit date: 05/09/1989    Years since quitting: 32.1   Smokeless tobacco: Never  Vaping Use   Vaping Use: Never used  Substance Use Topics   Alcohol use: Yes    Alcohol/week: 2.0 - 3.0 standard drinks    Types: 2 - 3 Glasses of wine per week    Comment: wine    Drug use: No     Colonoscopy: 04/2018 (Dr. Ardis Hughs), recall 2029  PAP: 2021  Bone density: 2021   Allergies  Allergen Reactions   Peach [Prunus Persica]     Hives/itching   Sulfa Antibiotics Hives   Topamax [Topiramate]     Bilateral leg pain   Shellfish Allergy Hives    Current Outpatient Medications  Medication Sig Dispense Refill   anastrozole (ARIMIDEX) 1 MG tablet Take 1 tablet (1 mg total) by mouth daily. 90 tablet 4   albuterol (VENTOLIN HFA) 108 (90 Base) MCG/ACT inhaler INHALE 2 PUFFS INTO THE LUNGS EVERY 6 HOURS AS NEEDED FOR WHEEZING OR SHORTNESS OF BREATH 25.5 g 0   aspirin 81 MG tablet Take 81 mg by mouth daily.     atorvastatin (LIPITOR) 40 MG tablet Take 1 tablet (40 mg total) by mouth daily. 90 tablet 3   b complex vitamins tablet Take 1 tablet by mouth daily.     buPROPion (WELLBUTRIN SR) 150 MG 12 hr tablet Take 1 tablet (150 mg total) by mouth 2 (two) times daily. 180 tablet 3   calcium carbonate (TUMS EX) 750 MG chewable tablet Chew 1 tablet by mouth daily.     cholecalciferol (VITAMIN D) 1000 UNITS  tablet Take 1,000 Units by mouth daily.     hydrochlorothiazide (HYDRODIURIL) 12.5 MG tablet Take 1 tablet (12.5 mg total) by mouth daily. 90 tablet 1   KRILL OIL PO Take by mouth.     Magnesium 250 MG TABS Take by mouth daily.      MELATONIN PO Take 10 mg by mouth at bedtime.      METRONIDAZOLE, TOPICAL, 0.75 % LOTN Apply 1 application topically in the morning and at bedtime. 59 mL 3   traMADol (ULTRAM) 50 MG tablet Take 1 tablet (50 mg total) by mouth every 6 (six) hours as needed for moderate pain or severe pain. 25 tablet 0   traZODone (DESYREL) 50 MG tablet TAKE 1 TO 2 TABLETS BY MOUTH EVERY NIGHT 1 HOUR BEFORE BEDTIME AS NEEDED. 180 tablet 3   No current facility-administered medications for this visit.    OBJECTIVE: White woman in no acute distress  Vitals:   06/14/21 1644  BP: 140/69  Pulse: 76  Resp: 18  Temp: 97.7 F (36.5 C)  SpO2: 98%     Body mass index is 35.14 kg/m.   Wt Readings from Last 3 Encounters:  06/14/21 186 lb (84.4 kg)  05/31/21 182 lb 8.7 oz (82.8 kg)  04/28/21 183 lb 3.2 oz (83.1 kg)      ECOG FS:1 - Symptomatic but completely ambulatory  There is a rash at the base of the neck which is erythematous, noncontinuous, palpable and itchy, consistent with reaction possibly to some chemical or soap used intraoperatively Ocular: Sclerae unicteric, pupils round and equal Ear-nose-throat: Wearing a mask Lymphatic: No cervical or supraclavicular adenopathy Lungs no rales or rhonchi Heart regular rate and rhythm Abd soft, nontender, positive bowel sounds MSK no focal spinal tenderness, no joint edema Neuro: non-focal, well-oriented, appropriate affect Breasts: The right breast is status postlumpectomy.  The incisions are healing very nicely.  There is mild discomfort to palpation in the right axilla.  There is no erythema or dehiscence.  The left breast and left axilla are benign   LAB RESULTS:  CMP     Component Value Date/Time   NA 135 04/28/2021  1231   K 4.1 04/28/2021 1231   CL 99 04/28/2021 1231   CO2 26 04/28/2021 1231   GLUCOSE 96 04/28/2021 1231   BUN 13 04/28/2021 1231  CREATININE 0.90 04/28/2021 1231   CREATININE 0.79 12/25/2020 1000   CALCIUM 9.9 04/28/2021 1231   PROT 7.1 04/28/2021 1231   ALBUMIN 4.1 04/28/2021 1231   AST 20 04/28/2021 1231   ALT 16 04/28/2021 1231   ALKPHOS 101 04/28/2021 1231   BILITOT 0.6 04/28/2021 1231   GFRNONAA >60 04/28/2021 1231   GFRNONAA 77 12/25/2020 1000   GFRAA 89 12/25/2020 1000    No results found for: TOTALPROTELP, ALBUMINELP, A1GS, A2GS, BETS, BETA2SER, GAMS, MSPIKE, SPEI  Lab Results  Component Value Date   WBC 6.4 04/28/2021   NEUTROABS 4.4 04/28/2021   HGB 12.6 04/28/2021   HCT 37.3 04/28/2021   MCV 93.3 04/28/2021   PLT 291 04/28/2021    No results found for: LABCA2  No components found for: [id number]  No results for input(s): INR in the last 168 hours.  No results found for: LABCA2  No results found for: EXH371  No results found for: IRC789  No results found for: FYB017  No results found for: CA2729  No components found for: HGQUANT  No results found for: CEA1 / No results found for: CEA1   No results found for: AFPTUMOR  No results found for: CHROMOGRNA  No results found for: KPAFRELGTCHN, LAMBDASER, KAPLAMBRATIO (kappa/lambda light chains)  No results found for: HGBA, HGBA2QUANT, HGBFQUANT, HGBSQUAN (Hemoglobinopathy evaluation)   No results found for: LDH  No results found for: IRON, TIBC, IRONPCTSAT (Iron and TIBC)  No results found for: FERRITIN  Urinalysis    Component Value Date/Time   BILIRUBINUR negative 03/28/2017 1622   KETONESUR negative 03/20/2014 1447   PROTEINUR negative 03/28/2017 1622   UROBILINOGEN 0.2 03/28/2017 1622   NITRITE positive 03/28/2017 1622   LEUKOCYTESUR Trace (A) 03/28/2017 1622     STUDIES: MM Breast Surgical Specimen  Result Date: 05/31/2021 CLINICAL DATA:  Status post lumpectomy today after  earlier radioactive seed localization. EXAM: SPECIMEN RADIOGRAPH OF THE RIGHT BREAST COMPARISON:  Previous exam(s). FINDINGS: Status post excision of the right breast. The radioactive seed and biopsy marker clip are present and completely intact within the periphery of the specimen. Findings discussed with the OR staff during the procedure. IMPRESSION: Specimen radiograph of the right breast. Electronically Signed   By: Franki Cabot M.D.   On: 05/31/2021 09:01  MM RT RADIOACTIVE SEED LOC MAMMO GUIDE  Result Date: 05/27/2021 CLINICAL DATA:  Pre lumpectomy localization of a recently diagnosed right breast invasive ductal carcinoma, marked with a ribbon shaped biopsy marker clip. EXAM: MAMMOGRAPHIC GUIDED RADIOACTIVE SEED LOCALIZATION OF THE RIGHT BREAST COMPARISON:  Previous exam(s). FINDINGS: Patient presents for radioactive seed localization prior to right lumpectomy. I met with the patient and we discussed the procedure of seed localization including benefits and alternatives. We discussed the high likelihood of a successful procedure. We discussed the risks of the procedure including infection, bleeding, tissue injury and further surgery. We discussed the low dose of radioactivity involved in the procedure. Informed, written consent was given. The usual time-out protocol was performed immediately prior to the procedure. Using mammographic guidance, sterile technique, 1% lidocaine and an I-125 radioactive seed, the right breast ribbon shaped clip was localized using a lateral approach. The follow-up mammogram images confirm the seed in the expected location and were marked for Dr. Ninfa Linden. Follow-up survey of the patient confirms presence of the radioactive seed. Order number of I-125 seed:  [id number]. Total activity:  0.246 mCi reference Date: 01/11/2021 The patient tolerated the procedure well and was released from the [facility]. She was given instructions regarding seed removal. IMPRESSION: Radioactive  seed localization right breast. No apparent complications. Electronically Signed   By: Claudie Revering M.D.   On: 05/27/2021 14:21    ELIGIBLE FOR AVAILABLE RESEARCH PROTOCOL: no  ASSESSMENT: 70 y.o. [place], [place] woman status post right breast lower outer quadrant biopsy 04/19/2021 for a clinical T1b N0, stage IA invasive ductal carcinoma, grade 2/3, estrogen and progesterone receptor strongly positive, HER2 equivocal with a borderline ratio by FISH (2.02/4.55, repeat 1.89/4.15)., with an MIB-1 of 15% (A) largest tumor dimension was 0.4 cm  (1) genetics testing 05/10/2021 through the [employer]-Cancer+RNAinsight panel offered by [facility] found no deleterious mutations in  APC, ATM, AXIN2, BARD1, BMPR1A, BRCA1, BRCA2, BRIP1, CDH1, CDK4, CDKN2A, CHEK2, DICER1, EPCAM, GREM1, HOXB13, MEN1, MLH1, MSH2, MSH3, MSH6, MUTYH, NBN, NF1, NF2, NTHL1, PALB2, PMS2, POLD1, POLE, PTEN, RAD51C, RAD51D, RECQL, RET, SDHA, SDHAF2, SDHB, SDHC, SDHD, SMAD4, SMARCA4, STK11, TP53, TSC1, TSC2, and VHL.  RNA data is routinely analyzed for use in variant interpretation for all genes.  (2) right lumpectomy with sentinel lymph node sampling 05/31/2021 showed a pT1a pN0, stage IA invasive ductal carcinoma, grade 2, with negative margins (a) a total of 3 right axillary lymph nodes were removed.  (3) Oncotype not indicated  (4) adjuvant radiation pending  (5) anastrozole started 06/14/2021   PLAN: I reviewed the final pathology results with Janice Dudley  and her husband.  He understands that patients with T1 a tumors have a very good prognosis even if they do not take systemic treatment.  That is why we did not obtain an Oncotype.  If she has surgery followed by radiation her chance of cure is very high, certainly greater than 90% and I quoted her a risk of recurrence in the a 6 to 8% after local treatment.  Certainly antiestrogens can cut that risk in half but that is a marginal benefit.  I think a better reason to consider  antiestrogens is prevention.  5 years of an antiestrogen will prevent a second breast cancer to develop (cuts that risk in half).  We then discussed the possible toxicities side effects and complications of anastrozole.  She is interested in starting that now because she feels radiation will need to be postponed until February--to have it family trip that has been long planned and will take the entire month of January.  Accordingly she will start anastrozole now.  I asked her to give [place] a call the third week in December to let us know how she is tolerating it.  If she tolerates it well it certainly would make sense to continue for 5 years and follow bone density closely.  If she does not tolerate then she has the option of switching to tamoxifen or observation.   She may continue anastrozole through radiation if she wishes.  She will see [place] in the med onc clinic late March, about a month after completing her radiation treatments.    Total encounter time 35 minutes.Sarajane Jews C. Polina Burmaster, MD 06/14/2021 5:36 PM Medical Oncology and Hematology [facility] [place], [place] [address] Tel. [phone]    Fax. [phone]   This document serves as a record of services personally performed by Lurline Del, MD. It was created on his behalf by Wilburn Mylar, a trained medical scribe. The creation of this record is based on the scribe's personal observations and the provider's statements to them.   Lindie Spruce MD, have reviewed  the above documentation for accuracy and completeness, and I agree with the above.   *Total Encounter Time as defined by the Centers for Medicare and Medicaid Services includes, in addition to the face-to-face time of a patient visit (documented in the note above) non-face-to-face time: obtaining and reviewing outside history, ordering and reviewing medications, tests or procedures, care coordination (communications with other health  care professionals or caregivers) and documentation in the medical record.

## 2021-06-15 ENCOUNTER — Telehealth: Payer: Self-pay | Admitting: Oncology

## 2021-06-15 NOTE — Telephone Encounter (Signed)
Scheduled appointment per 11/21 los. Patient is aware. 

## 2021-06-22 ENCOUNTER — Other Ambulatory Visit: Payer: Self-pay

## 2021-06-22 ENCOUNTER — Encounter: Payer: Self-pay | Admitting: Rehabilitation

## 2021-06-22 ENCOUNTER — Ambulatory Visit: Payer: Medicare PPO | Attending: Oncology | Admitting: Rehabilitation

## 2021-06-22 DIAGNOSIS — G8929 Other chronic pain: Secondary | ICD-10-CM | POA: Insufficient documentation

## 2021-06-22 DIAGNOSIS — R293 Abnormal posture: Secondary | ICD-10-CM | POA: Insufficient documentation

## 2021-06-22 DIAGNOSIS — C50511 Malignant neoplasm of lower-outer quadrant of right female breast: Secondary | ICD-10-CM | POA: Insufficient documentation

## 2021-06-22 DIAGNOSIS — M25561 Pain in right knee: Secondary | ICD-10-CM | POA: Insufficient documentation

## 2021-06-22 DIAGNOSIS — M25562 Pain in left knee: Secondary | ICD-10-CM | POA: Insufficient documentation

## 2021-06-22 DIAGNOSIS — Z170 Estrogen receptor positive status [ER+]: Secondary | ICD-10-CM | POA: Insufficient documentation

## 2021-06-22 NOTE — Therapy (Signed)
[facility] @ [facility] [place], [place], [address] Phone: [phone]   Fax:  [phone]  Physical Therapy Treatment  Patient Details  Name: Janice Dudley MRN: [id number] Date of Birth: 1951-02-15 Referring Provider (PT): Dr. Ninfa Linden   Encounter Date: 06/22/2021   PT End of Session - 06/22/21 0931     Visit Number 2    Number of Visits 2    PT Start Time 0900    PT Stop Time 0930    PT Time Calculation (min) 30 min    Activity Tolerance Patient tolerated treatment well    Behavior During Therapy [facility] for tasks assessed/performed             Past Medical History:  Diagnosis Date   Allergy 1960s, 1970s, 2000   Arthritis    Breast cancer ([place])    Cataract    no surgery yet   Hyperlipidemia     Past Surgical History:  Procedure Laterality Date   BREAST EXCISIONAL BIOPSY Left 1999   BREAST LUMPECTOMY WITH RADIOACTIVE SEED AND SENTINEL LYMPH NODE BIOPSY Right 05/31/2021   Procedure: RIGHT BREAST LUMPECTOMY WITH RADIOACTIVE SEED AND SENTINEL LYMPH NODE BIOPSY;  Surgeon: Coralie Keens, MD;  Location: [facility];  Service: General;  Laterality: Right;   BREAST SURGERY  1999   Lumpectomy   CESAREAN SECTION  1982/1983   2 times   COLONOSCOPY  2008   EYE SURGERY  01/2021   Vitrectomy   LAPAROSCOPY     abd/tubal ligation   TUBAL LIGATION  1988   VITRECTOMY      There were no vitals filed for this visit.   Subjective Assessment - 06/22/21 0903     Subjective Doing well.  Some twinges but okay. wearing a regular bra    Pertinent History Rt lumpectomy with SLNB 05/31/21 with Dr. Ninfa Linden 0/3 lymph nodes removed.  Starting radiation hopefully in February after a trip to [place].  Hx of lumpectomy on the left in 1999 but no lymph node removal. Knee OA    Currently in Pain? No/denies   random twinges every now and then               [facility] PT Assessment - 06/22/21 0001        AROM   Right Shoulder Extension 55 Degrees    Right Shoulder Flexion 161 Degrees   pull in the axilla   Right Shoulder ABduction 165 Degrees    Right Shoulder External Rotation 90 Degrees               LYMPHEDEMA/ONCOLOGY QUESTIONNAIRE - 06/22/21 0001       Right Upper Extremity Lymphedema   10 cm Proximal to Olecranon Process 32.5 cm   error from last measure   Olecranon Process 28 cm    10 cm Proximal to Ulnar Styloid Process 22.2 cm    Just Proximal to Ulnar Styloid Process 18 cm    Across Hand at [employer] 20 cm    At [place] of 2nd Digit 7.3 cm                                 PT Education - 06/22/21 0931     Education Details SOZO surveillance, scar massage, HEP additions, where to get bras    Person(s) Educated Patient    Methods Explanation;Demonstration;Verbal cues;Handout    Comprehension  Verbalized understanding                 PT Long Term Goals - 06/22/21 0933       PT LONG TERM GOAL #1   Title Pt will return to baseline AROM    Status Achieved      PT LONG TERM GOAL #2   Title Pt will be educated on lymphedema risk reduction and SOZO surveillance    Status Achieved                   Plan - 06/22/21 0931     Clinical Impression Statement Pt has returned to within 5 degrees of all AROM and has minimal pain and pulling.  Advanced HEP to include supine flexion and behind the head and flexion wall walking.  pt is signed up for ABC class and next SOZO visit and has no more PT needs at this time .    PT Next Visit Plan SOZO    Consulted and Agree with Plan of Care Patient             Patient will benefit from skilled therapeutic intervention in order to improve the following deficits and impairments:     Visit Diagnosis: Abnormal posture  Malignant neoplasm of lower-outer quadrant of right breast of female, estrogen receptor positive ([facility])  Chronic pain of left knee  Chronic pain of right  knee     Problem List Patient Active Problem List   Diagnosis Date Noted   Genetic testing 05/10/2021   Family history of breast cancer 04/29/2021   Family history of uterine cancer 04/29/2021   Family history of prostate cancer 04/29/2021   Malignant neoplasm of lower-outer quadrant of right breast of female, estrogen receptor positive ([place]) 04/23/2021   Elevated liver enzymes 12/28/2020   Primary osteoarthritis of both knees 05/27/2020   Mixed hyperlipidemia 05/26/2020   Perceived hearing changes 05/26/2020   Left breast mass 01/14/2019   Hypotension 01/14/2019   Laceration of right lower extremity 01/14/2019   H/O osteopenia 11/29/2017   Strain of abdominal muscle 09/28/2017   No energy 06/22/2017   Trouble in sleeping 06/22/2017   Atypical pigmented skin lesion 03/02/2016   Post-menopausal 03/02/2016   Abnormal weight gain 05/16/2014   Hypertriglyceridemia 04/15/2014   Obesity 04/15/2014   Fibrocystic breast 03/25/2014   Hyperlipidemia     Stark Bray, PT 06/22/2021, 9:33 AM  [facility] @ [address], [place], [address] Phone: [phone]   Fax:  [phone]  Name: Janice Dudley MRN: [id number] Date of Birth: 1951/07/21

## 2021-06-22 NOTE — Patient Instructions (Signed)
[facility]  [address], [address]  [place] [address]  [phone]  After Breast Cancer Class It is recommended you attend the ABC class to be educated on lymphedema risk reduction. This class is free of charge and lasts for 1 hour. It is a 1-time class. You will need to download the Webex app either on your phone or computer. We will send you a link the night before or the morning of the class. You should be able to click on that link to join the class. This is not a confidential class. You don't have to turn your camera on, but other participants may be able to see your email address.  Scar massage You can begin gentle scar massage to you incision sites. Gently place one hand on the incision and move the skin (without sliding on the skin) in various directions. Do this for a few minutes and then you can gently massage either coconut oil or vitamin E cream into the scars.  Compression garment You should continue wearing your compression bra until you feel like you no longer have swelling.  Home exercise Program Continue doing the exercises you were given until you feel like you can do them without feeling any tightness at the end.   Walking Program Studies show that 30 minutes of walking per day (fast enough to elevate your heart rate) can significantly reduce the risk of a cancer recurrence. If you can't walk due to other medical reasons, we encourage you to find another activity you could do (like a stationary bike or water exercise).  Posture After breast cancer surgery, people frequently sit with rounded shoulders posture because it puts their incisions on slack and feels better. If you sit like this and scar tissue forms in that position, you can become very tight and have pain sitting or standing with good posture. Try to be aware of your posture and sit and stand up tall to heal properly.  Follow up PT: It is recommended you return every 3 months for the  first 3 years following surgery to be assessed on the SOZO machine for an L-Dex score. This helps prevent clinically significant lymphedema in 95% of patients. These follow up screens are 10 minute appointments that you are not billed for. 

## 2021-06-28 ENCOUNTER — Other Ambulatory Visit: Payer: Self-pay

## 2021-06-28 DIAGNOSIS — G479 Sleep disorder, unspecified: Secondary | ICD-10-CM

## 2021-06-28 MED ORDER — TRAZODONE HCL 50 MG PO TABS: 50.0000 mg | ORAL_TABLET | Freq: Every day | ORAL | 0 refills | Status: DC

## 2021-06-29 ENCOUNTER — Other Ambulatory Visit: Payer: Self-pay

## 2021-06-29 ENCOUNTER — Encounter: Payer: Self-pay | Admitting: Radiation Oncology

## 2021-06-29 ENCOUNTER — Ambulatory Visit
Admission: RE | Admit: 2021-06-29 | Discharge: 2021-06-29 | Disposition: A | Discharge status: Home / Self Care | Payer: Medicare PPO | Source: Ambulatory Visit | Attending: Radiation Oncology | Admitting: Radiation Oncology

## 2021-06-29 ENCOUNTER — Ambulatory Visit: Admission: RE | Admit: 2021-06-29 | Payer: Medicare PPO | Source: Ambulatory Visit | Admitting: Radiation Oncology

## 2021-06-29 VITALS — BP 118/69 | HR 82 | Temp 96.8°F | Resp 18 | Ht 61.0 in | Wt 188.0 lb

## 2021-06-29 DIAGNOSIS — N6320 Unspecified lump in the left breast, unspecified quadrant: Secondary | ICD-10-CM | POA: Diagnosis not present

## 2021-06-29 DIAGNOSIS — M129 Arthropathy, unspecified: Secondary | ICD-10-CM | POA: Diagnosis not present

## 2021-06-29 DIAGNOSIS — C50511 Malignant neoplasm of lower-outer quadrant of right female breast: Secondary | ICD-10-CM | POA: Insufficient documentation

## 2021-06-29 DIAGNOSIS — E785 Hyperlipidemia, unspecified: Secondary | ICD-10-CM | POA: Insufficient documentation

## 2021-06-29 DIAGNOSIS — Z170 Estrogen receptor positive status [ER+]: Secondary | ICD-10-CM | POA: Insufficient documentation

## 2021-06-29 DIAGNOSIS — Z801 Family history of malignant neoplasm of trachea, bronchus and lung: Secondary | ICD-10-CM | POA: Insufficient documentation

## 2021-06-29 DIAGNOSIS — Z510 Encounter for antineoplastic radiation therapy: Secondary | ICD-10-CM | POA: Diagnosis not present

## 2021-06-29 DIAGNOSIS — Z803 Family history of malignant neoplasm of breast: Secondary | ICD-10-CM | POA: Insufficient documentation

## 2021-06-29 DIAGNOSIS — Z87891 Personal history of nicotine dependence: Secondary | ICD-10-CM | POA: Insufficient documentation

## 2021-06-29 DIAGNOSIS — Z7982 Long term (current) use of aspirin: Secondary | ICD-10-CM | POA: Insufficient documentation

## 2021-06-29 NOTE — Progress Notes (Signed)
Radiation Oncology         [phone]) [phone] ________________________________  Name: Janice Dudley        MRN: [id number]  Date of Service: 06/29/2021 DOB: 15-Jan-1951  RV:UYEBXIDH, Royetta Car, PA-C  Magrinat, [name], MD     REFERRING PHYSICIAN: Magrinat, [name], MD   DIAGNOSIS: The encounter diagnosis was Malignant neoplasm of lower-outer quadrant of right breast of female, estrogen receptor positive ([place]).   HISTORY OF PRESENT ILLNESS: Janice Dudley is a 70 y.o. female originally seen in the multidisciplinary breast clinic for a new diagnosis of right breast cancer. The patient was noted to have a diagnostic mammogram for follow up of a mass in the left breast. This persisted and is in the 4:00 position measuring 6 mm. She will have a biopsy of this soon, but incidentally in the right breast a mass was seen in the 7:00 position measuring 7 mm. Biopsy of this right sided finding on 04/19/21 showed a grade 2 invasive ductal carcinoma that was ER/PR positive, HER2 negative, Ki-67 was 15%. A biopsy of the left breast was benign.   Since her last visit, the patient has undergone right lumpectomy on 05/31/21 with sentinel lymph node biopsy with Dr. Ninfa Linden.  Final pathology reveals grade 2 invasive ductal carcinoma measuring a total of 5 mm.  Her margins were clear, 3 sampled lymph nodes were negative for metastatic disease.  Dr. Jana Hakim does not recommend Oncotype.  She is ready to proceed with adjuvant radiotherapy and is seen today to discuss this.  She also started anastrozole on 06/14/2021.    PREVIOUS RADIATION THERAPY: No   PAST MEDICAL HISTORY:  Past Medical History:  Diagnosis Date   Allergy 1960s, 38s, 2000   Arthritis    Breast cancer ([place])    Cataract    no surgery yet   Hyperlipidemia        PAST SURGICAL HISTORY: Past Surgical History:  Procedure Laterality Date   BREAST EXCISIONAL BIOPSY Left 1999   BREAST LUMPECTOMY WITH RADIOACTIVE SEED AND  SENTINEL LYMPH NODE BIOPSY Right 05/31/2021   Procedure: RIGHT BREAST LUMPECTOMY WITH RADIOACTIVE SEED AND SENTINEL LYMPH NODE BIOPSY;  Surgeon: Coralie Keens, MD;  Location: [facility];  Service: General;  Laterality: Right;   BREAST SURGERY  1999   Lumpectomy   CESAREAN SECTION  1982/1983   2 times   COLONOSCOPY  2008   EYE SURGERY  01/2021   Vitrectomy   LAPAROSCOPY     abd/tubal ligation   TUBAL LIGATION  1988   VITRECTOMY       FAMILY HISTORY:  Family History  Problem Relation Age of Onset   Stroke Mother 18   Heart attack Mother 58   Cancer Mother 64       Uterine   Breast cancer Mother 72   Heart attack Father 87   Cancer Father 35       Lung   Stroke Father 51   Hyperlipidemia Sister    Breast cancer Sister 46   Hyperlipidemia Sister    Thyroid nodules Brother    Hyperlipidemia Brother    Prostate cancer Brother 49   Hyperlipidemia Brother    Lung cancer Brother 68   Thyroid cancer Brother        dx. early 41s   Hyperlipidemia Brother    Anxiety disorder Daughter    Depression Daughter    Colon cancer Neg Hx    Esophageal cancer Neg Hx  Rectal cancer Neg Hx      SOCIAL HISTORY:  reports that she quit smoking about 32 years ago. Her smoking use included cigarettes. She has a 5.00 pack-year smoking history. She has never used smokeless tobacco. She reports current alcohol use of about 2.0 - 3.0 standard drinks per week. She reports that she does not use drugs. The patient is married and lives in [place]. 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 She has two adult children and young grandchildren. Her 30 year old granddaughter and daughter live with she and her husband. She's looking forward to a trip to [place] in January 2023.  ALLERGIES: Peach [prunus persica], Sulfa antibiotics, Topamax [topiramate], and Shellfish allergy   MEDICATIONS:  Current Outpatient Medications  Medication Sig Dispense Refill    albuterol (VENTOLIN HFA) 108 (90 Base) MCG/ACT inhaler INHALE 2 PUFFS INTO THE LUNGS EVERY 6 HOURS AS NEEDED FOR WHEEZING OR SHORTNESS OF BREATH 25.5 g 0   anastrozole (ARIMIDEX) 1 MG tablet Take 1 tablet (1 mg total) by mouth daily. 90 tablet 4   aspirin 81 MG tablet Take 81 mg by mouth daily.     atorvastatin (LIPITOR) 40 MG tablet Take 1 tablet (40 mg total) by mouth daily. 90 tablet 3   b complex vitamins tablet Take 1 tablet by mouth daily.     buPROPion (WELLBUTRIN SR) 150 MG 12 hr tablet Take 1 tablet (150 mg total) by mouth 2 (two) times daily. 180 tablet 3   calcium carbonate (TUMS EX) 750 MG chewable tablet Chew 1 tablet by mouth daily.     cholecalciferol (VITAMIN D) 1000 UNITS tablet Take 1,000 Units by mouth daily.     hydrochlorothiazide (HYDRODIURIL) 12.5 MG tablet Take 1 tablet (12.5 mg total) by mouth daily. 90 tablet 1   KRILL OIL PO Take by mouth.     Magnesium 250 MG TABS Take by mouth daily.      MELATONIN PO Take 10 mg by mouth at bedtime.      METRONIDAZOLE, TOPICAL, 0.75 % LOTN Apply 1 application topically in the morning and at bedtime. 59 mL 3   traMADol (ULTRAM) 50 MG tablet Take 1 tablet (50 mg total) by mouth every 6 (six) hours as needed for moderate pain or severe pain. 25 tablet 0   traZODone (DESYREL) 50 MG tablet Take 1-2 tablets (50-100 mg total) by mouth at bedtime. 1 HOUR BEFORE BEDTIME AS NEEDED  **PATIENT NEEDS OFFICE VISIT FOR ADDITIONAL REFILLS** 60 tablet 0   No current facility-administered medications for this encounter.     REVIEW OF SYSTEMS: On review of systems, the patient reports that she is doing well since surgery. She has had some limited range of motion in her RUE but has been working with PT regularly to improve this. She has had some soreness along her breast incision but no other concerns about redness or drainage.   PHYSICAL EXAM:  Wt Readings from Last 3 Encounters:  06/14/21 186 lb (84.4 kg)  05/31/21 182 lb 8.7 oz (82.8 kg)   04/28/21 183 lb 3.2 oz (83.1 kg)   Temp Readings from Last 3 Encounters:  06/14/21 97.7 F (36.5 C) (Temporal)  05/31/21 (!) 97.5 F (36.4 C) (Oral)  04/28/21 97.9 F (36.6 C) (Tympanic)   BP Readings from Last 3 Encounters:  06/14/21 140/69  05/31/21 134/66  04/28/21 114/62   Pulse Readings from Last 3 Encounters:  06/14/21 76  05/31/21 69  04/28/21  30    In general this is a well appearing caucasian female in no acute distress. She's alert and oriented x4 and appropriate throughout the examination. Cardiopulmonary assessment is negative for acute distress and she exhibits normal effort. The right breast reveals a well healed incision site with mild ecchymosis. No abnormalities are noted of the axillary incision site.    ECOG = 1  0 - Asymptomatic (Fully active, able to carry on all predisease activities without restriction)  1 - Symptomatic but completely ambulatory (Restricted in physically strenuous activity but ambulatory and able to carry out work of a light or sedentary nature. For example, light housework, office work)  2 - Symptomatic, <50% in bed during the day (Ambulatory and capable of all self care but unable to carry out any work activities. Up and about more than 50% of waking hours)  3 - Symptomatic, >50% in bed, but not bedbound (Capable of only limited self-care, confined to bed or chair 50% or more of waking hours)  4 - Bedbound (Completely disabled. Cannot carry on any self-care. Totally confined to bed or chair)  5 - Death   Eustace Pen MM, Creech RH, Tormey DC, et al. [phone]). "Toxicity and response criteria of the [facility]". [place] Oncol. 5 (6): 649-55    LABORATORY DATA:  Lab Results  Component Value Date   WBC 6.4 04/28/2021   HGB 12.6 04/28/2021   HCT 37.3 04/28/2021   MCV 93.3 04/28/2021   PLT 291 04/28/2021   Lab Results  Component Value Date   NA 135 04/28/2021   K 4.1 04/28/2021   CL 99 04/28/2021   CO2  26 04/28/2021   Lab Results  Component Value Date   ALT 16 04/28/2021   AST 20 04/28/2021   ALKPHOS 101 04/28/2021   BILITOT 0.6 04/28/2021      RADIOGRAPHY: MM Breast Surgical Specimen  Result Date: 05/31/2021 CLINICAL DATA:  Status post lumpectomy today after earlier radioactive seed localization. EXAM: SPECIMEN RADIOGRAPH OF THE RIGHT BREAST COMPARISON:  Previous exam(s). FINDINGS: Status post excision of the right breast. The radioactive seed and biopsy marker clip are present and completely intact within the periphery of the specimen. Findings discussed with the OR staff during the procedure. IMPRESSION: Specimen radiograph of the right breast. Electronically Signed   By: Franki Cabot M.D.   On: 05/31/2021 09:01      IMPRESSION/PLAN: 1. Stage IA, pT1aN0M0 grade 2, ER/PR positive invasive ductal carcinoma of the right breast. Dr. Lisbeth Renshaw has reviewed her case and final pathology. Today I also reviewed her  pathology findings and reviews the nature of right breast disease. She has done well since surgery and does not need systemic therapy. Dr. Lisbeth Renshaw does recommend external radiotherapy to the breast  to reduce risks of local recurrence followed by antiestrogen therapy. We discussed the risks, benefits, short, and long term effects of radiotherapy, as well as the curative intent, and the patient is interested in proceeding. Dr. Lisbeth Renshaw discusses the delivery and logistics of radiotherapy and recommends 4 weeks of radiotherapy to the right breast. Written consent is obtained and placed in the chart, a copy was provided to the patient. She will simulate today and start treatment next week so she can finish radiation prior to her upcoming trip to [place] in January 2023. 2. Left breast mass. The pathology was benign during her work up, and this will be followed closely during her surveillance.    In a visit lasting  45 minutes, greater than 50% of the time was spent face to face reviewing her  case, as well as in preparation of, discussing, and coordinating the patient's care.       Carola Rhine, [facility]    **Disclaimer: This note was dictated with voice recognition software. Similar sounding words can inadvertently be transcribed and this note may contain transcription errors which may not have been corrected upon publication of note.**

## 2021-06-29 NOTE — Progress Notes (Addendum)
Patient reports RT axilla pain, w/ limited range of motion RT arm. No other symptoms reported at this time. Meaningful use complete.  Postmenopausal-No chances of pregnancy.  BP 118/69 (BP Location: Left Arm, Patient Position: Sitting, Cuff Size: Normal)   Pulse 82   Temp (!) 96.8 F (36 C) (Temporal)   Resp 18   Ht 5\' 1"  (1.549 m)   Wt 188 lb (85.3 kg)   SpO2 99%   BMI 35.52 kg/m

## 2021-06-30 ENCOUNTER — Ambulatory Visit: Payer: Medicare PPO | Admitting: Physician Assistant

## 2021-06-30 ENCOUNTER — Encounter: Payer: Self-pay | Admitting: Physician Assistant

## 2021-06-30 VITALS — BP 122/75 | HR 74 | Ht 61.0 in | Wt 186.0 lb

## 2021-06-30 DIAGNOSIS — E782 Mixed hyperlipidemia: Secondary | ICD-10-CM

## 2021-06-30 DIAGNOSIS — G479 Sleep disorder, unspecified: Secondary | ICD-10-CM

## 2021-06-30 DIAGNOSIS — L219 Seborrheic dermatitis, unspecified: Secondary | ICD-10-CM | POA: Diagnosis not present

## 2021-06-30 DIAGNOSIS — E781 Pure hyperglyceridemia: Secondary | ICD-10-CM | POA: Diagnosis not present

## 2021-06-30 DIAGNOSIS — R600 Localized edema: Secondary | ICD-10-CM

## 2021-06-30 MED ORDER — HYDROCHLOROTHIAZIDE 12.5 MG PO TABS: 12.5000 mg | ORAL_TABLET | Freq: Every day | ORAL | 3 refills | Status: DC

## 2021-06-30 MED ORDER — TRAZODONE HCL 50 MG PO TABS: 50.0000 mg | ORAL_TABLET | Freq: Every day | ORAL | 3 refills | Status: DC

## 2021-06-30 MED ORDER — CLOBETASOL PROPIONATE 0.05 % EX FOAM: Freq: Two times a day (BID) | CUTANEOUS | 1 refills | Status: DC

## 2021-06-30 NOTE — Progress Notes (Signed)
Subjective:    Patient ID: Janice Dudley, female    DOB: October 18, 1950, 70 y.o.   MRN: [id number]  HPI Pt is a 70 yo female with right breast cancer, HLD, insomnia, itchy scalp rash who presents to the clinic for medication follow up.   Trazodone is helping with sleep but she does need refills.   She is done with chemo for [facility] but will start radiation soon.   BP is looking good. Pt lower leg edema is controlled.   She continues to have an itchy scalp rash. Hydrocortisone helps some.   .. Active Ambulatory Problems    Diagnosis Date Noted   Hyperlipidemia    Fibrocystic breast 03/25/2014   Hypertriglyceridemia 04/15/2014   Obesity 04/15/2014   Abnormal weight gain 05/16/2014   Atypical pigmented skin lesion 03/02/2016   Post-menopausal 03/02/2016   No energy 06/22/2017   Trouble in sleeping 06/22/2017   Strain of abdominal muscle 09/28/2017   H/O osteopenia 11/29/2017   Left breast mass 01/14/2019   Hypotension 01/14/2019   Laceration of right lower extremity 01/14/2019   Mixed hyperlipidemia 05/26/2020   Perceived hearing changes 05/26/2020   Primary osteoarthritis of both knees 05/27/2020   Elevated liver enzymes 12/28/2020   Malignant neoplasm of lower-outer quadrant of right breast of female, estrogen receptor positive ([place]) 04/23/2021   Family history of breast cancer 04/29/2021   Family history of uterine cancer 04/29/2021   Family history of prostate cancer 04/29/2021   Genetic testing 05/10/2021   Lower extremity edema 07/02/2021   Seborrheic dermatitis 07/02/2021   Resolved Ambulatory Problems    Diagnosis Date Noted   Sprain of ankle 05/16/2014   Obesity (BMI 30.0-34.9) 06/22/2017   Past Medical History:  Diagnosis Date   Allergy 1960s, 1970s, 2000   Arthritis    Breast cancer ([place])    Cataract       Review of Systems See HPI.     Objective:   Physical Exam Vitals reviewed.  Constitutional:      Appearance: Normal appearance.  HENT:      Head: Normocephalic.  Cardiovascular:     Rate and Rhythm: Normal rate and regular rhythm.     Heart sounds: Normal heart sounds.  Pulmonary:     Effort: Pulmonary effort is normal.     Breath sounds: Normal breath sounds.  Musculoskeletal:     Right lower leg: No edema.     Left lower leg: No edema.  Skin:    Comments: Occipital erythematous rash of skin and scalp line with some fine scales noted.   Neurological:     General: No focal deficit present.     Mental Status: She is alert and oriented to person, place, and time.  Psychiatric:        Mood and Affect: Mood normal.          Assessment & Plan:  Marland KitchenMarland KitchenLeana was seen today for follow-up.  Diagnoses and all orders for this visit:  Mixed hyperlipidemia -     Lipid Panel w/reflex Direct LDL  Hypertriglyceridemia -     Lipid Panel w/reflex Direct LDL  Trouble in sleeping -     traZODone (DESYREL) 50 MG tablet; Take 1-2 tablets (50-100 mg total) by mouth at bedtime. 1 HOUR BEFORE BEDTIME AS NEEDED  Lower extremity edema -     hydrochlorothiazide (HYDRODIURIL) 12.5 MG tablet; Take 1 tablet (12.5 mg total) by mouth daily.  Seborrheic dermatitis -     clobetasol (OLUX) 0.05 %  topical foam; Apply topically 2 (two) times daily.  Refilled medications.  Vitals look great.  Fasting labs ordered.  Clobetasol given for itchy red scalp rash.  Follow up as needed or if symptoms persist.  Pt has first shingles vaccine.

## 2021-07-02 ENCOUNTER — Encounter: Payer: Self-pay | Admitting: Physician Assistant

## 2021-07-02 DIAGNOSIS — L219 Seborrheic dermatitis, unspecified: Secondary | ICD-10-CM | POA: Insufficient documentation

## 2021-07-02 DIAGNOSIS — R600 Localized edema: Secondary | ICD-10-CM | POA: Insufficient documentation

## 2021-07-06 ENCOUNTER — Encounter: Payer: Self-pay | Admitting: *Deleted

## 2021-07-07 ENCOUNTER — Ambulatory Visit
Admission: RE | Admit: 2021-07-07 | Discharge: 2021-07-07 | Disposition: A | Discharge status: Home / Self Care | Payer: Medicare PPO | Source: Ambulatory Visit | Attending: Radiation Oncology | Admitting: Radiation Oncology

## 2021-07-07 ENCOUNTER — Other Ambulatory Visit: Payer: Self-pay

## 2021-07-07 DIAGNOSIS — Z510 Encounter for antineoplastic radiation therapy: Secondary | ICD-10-CM | POA: Diagnosis not present

## 2021-07-07 DIAGNOSIS — Z170 Estrogen receptor positive status [ER+]: Secondary | ICD-10-CM | POA: Diagnosis not present

## 2021-07-07 DIAGNOSIS — C50511 Malignant neoplasm of lower-outer quadrant of right female breast: Secondary | ICD-10-CM | POA: Diagnosis not present

## 2021-07-08 ENCOUNTER — Ambulatory Visit
Admission: RE | Admit: 2021-07-08 | Discharge: 2021-07-08 | Disposition: A | Discharge status: Home / Self Care | Payer: Medicare PPO | Source: Ambulatory Visit | Attending: Radiation Oncology | Admitting: Radiation Oncology

## 2021-07-08 DIAGNOSIS — Z170 Estrogen receptor positive status [ER+]: Secondary | ICD-10-CM | POA: Diagnosis not present

## 2021-07-08 DIAGNOSIS — Z510 Encounter for antineoplastic radiation therapy: Secondary | ICD-10-CM | POA: Diagnosis not present

## 2021-07-08 DIAGNOSIS — C50511 Malignant neoplasm of lower-outer quadrant of right female breast: Secondary | ICD-10-CM | POA: Diagnosis not present

## 2021-07-08 NOTE — Progress Notes (Signed)
Pt here for patient teaching.  Pt given Radiation and You booklet, skin care instructions, Alra deodorant, and Radiaplex gel.  Reviewed areas of pertinence such as fatigue, hair loss, skin changes, breast tenderness, and breast swelling . Pt able to give teach back of to pat skin and use unscented/gentle soap,apply Radiaplex bid, avoid applying anything to skin within 4 hours of treatment, avoid wearing an under wire bra, and to use an electric razor if they must shave. Pt verbalizes understanding of information given and will contact nursing with any questions or concerns.    Chiquita Heckert M. Chalet Kerwin RN, BSN      

## 2021-07-09 ENCOUNTER — Other Ambulatory Visit: Payer: Self-pay

## 2021-07-09 ENCOUNTER — Ambulatory Visit
Admission: RE | Admit: 2021-07-09 | Discharge: 2021-07-09 | Disposition: A | Discharge status: Home / Self Care | Payer: Medicare PPO | Source: Ambulatory Visit | Attending: Radiation Oncology | Admitting: Radiation Oncology

## 2021-07-09 DIAGNOSIS — C50511 Malignant neoplasm of lower-outer quadrant of right female breast: Secondary | ICD-10-CM | POA: Diagnosis not present

## 2021-07-09 DIAGNOSIS — Z170 Estrogen receptor positive status [ER+]: Secondary | ICD-10-CM

## 2021-07-09 DIAGNOSIS — Z510 Encounter for antineoplastic radiation therapy: Secondary | ICD-10-CM | POA: Diagnosis not present

## 2021-07-09 MED ORDER — RADIAPLEXRX EX GEL: Freq: Once | CUTANEOUS | Status: AC

## 2021-07-09 MED ORDER — ALRA NON-METALLIC DEODORANT (RAD-ONC)
1.0000 "application " | Freq: Once | TOPICAL | Status: AC
  Administered 2021-07-09: 1 via TOPICAL

## 2021-07-12 ENCOUNTER — Ambulatory Visit
Admission: RE | Admit: 2021-07-12 | Discharge: 2021-07-12 | Disposition: A | Discharge status: Home / Self Care | Payer: Medicare PPO | Source: Ambulatory Visit | Attending: Radiation Oncology | Admitting: Radiation Oncology

## 2021-07-12 ENCOUNTER — Other Ambulatory Visit: Payer: Self-pay

## 2021-07-12 ENCOUNTER — Ambulatory Visit: Payer: Medicare PPO

## 2021-07-12 DIAGNOSIS — Z170 Estrogen receptor positive status [ER+]: Secondary | ICD-10-CM | POA: Diagnosis not present

## 2021-07-12 DIAGNOSIS — Z510 Encounter for antineoplastic radiation therapy: Secondary | ICD-10-CM | POA: Diagnosis not present

## 2021-07-12 DIAGNOSIS — C50511 Malignant neoplasm of lower-outer quadrant of right female breast: Secondary | ICD-10-CM | POA: Diagnosis not present

## 2021-07-13 ENCOUNTER — Ambulatory Visit
Admission: RE | Admit: 2021-07-13 | Discharge: 2021-07-13 | Disposition: A | Discharge status: Home / Self Care | Payer: Medicare PPO | Source: Ambulatory Visit | Attending: Radiation Oncology | Admitting: Radiation Oncology

## 2021-07-13 DIAGNOSIS — Z170 Estrogen receptor positive status [ER+]: Secondary | ICD-10-CM | POA: Diagnosis not present

## 2021-07-13 DIAGNOSIS — C50511 Malignant neoplasm of lower-outer quadrant of right female breast: Secondary | ICD-10-CM | POA: Diagnosis not present

## 2021-07-13 DIAGNOSIS — Z510 Encounter for antineoplastic radiation therapy: Secondary | ICD-10-CM | POA: Diagnosis not present

## 2021-07-14 ENCOUNTER — Other Ambulatory Visit: Payer: Self-pay

## 2021-07-14 ENCOUNTER — Ambulatory Visit
Admission: RE | Admit: 2021-07-14 | Discharge: 2021-07-14 | Disposition: A | Discharge status: Home / Self Care | Payer: Medicare PPO | Source: Ambulatory Visit | Attending: Radiation Oncology | Admitting: Radiation Oncology

## 2021-07-14 DIAGNOSIS — Z510 Encounter for antineoplastic radiation therapy: Secondary | ICD-10-CM | POA: Diagnosis not present

## 2021-07-14 DIAGNOSIS — Z170 Estrogen receptor positive status [ER+]: Secondary | ICD-10-CM | POA: Diagnosis not present

## 2021-07-14 DIAGNOSIS — C50511 Malignant neoplasm of lower-outer quadrant of right female breast: Secondary | ICD-10-CM | POA: Diagnosis not present

## 2021-07-15 ENCOUNTER — Other Ambulatory Visit: Payer: Self-pay

## 2021-07-15 ENCOUNTER — Ambulatory Visit
Admission: RE | Admit: 2021-07-15 | Discharge: 2021-07-15 | Disposition: A | Discharge status: Home / Self Care | Payer: Medicare PPO | Source: Ambulatory Visit | Attending: Radiation Oncology | Admitting: Radiation Oncology

## 2021-07-15 DIAGNOSIS — Z510 Encounter for antineoplastic radiation therapy: Secondary | ICD-10-CM | POA: Diagnosis not present

## 2021-07-15 DIAGNOSIS — C50511 Malignant neoplasm of lower-outer quadrant of right female breast: Secondary | ICD-10-CM | POA: Diagnosis not present

## 2021-07-15 DIAGNOSIS — Z170 Estrogen receptor positive status [ER+]: Secondary | ICD-10-CM | POA: Diagnosis not present

## 2021-07-16 ENCOUNTER — Ambulatory Visit
Admission: RE | Admit: 2021-07-16 | Discharge: 2021-07-16 | Disposition: A | Discharge status: Home / Self Care | Payer: Medicare PPO | Source: Ambulatory Visit | Attending: Radiation Oncology | Admitting: Radiation Oncology

## 2021-07-16 DIAGNOSIS — Z510 Encounter for antineoplastic radiation therapy: Secondary | ICD-10-CM | POA: Diagnosis not present

## 2021-07-16 DIAGNOSIS — Z170 Estrogen receptor positive status [ER+]: Secondary | ICD-10-CM | POA: Diagnosis not present

## 2021-07-16 DIAGNOSIS — C50511 Malignant neoplasm of lower-outer quadrant of right female breast: Secondary | ICD-10-CM | POA: Diagnosis not present

## 2021-07-20 ENCOUNTER — Ambulatory Visit
Admission: RE | Admit: 2021-07-20 | Discharge: 2021-07-20 | Disposition: A | Discharge status: Home / Self Care | Payer: Medicare PPO | Source: Ambulatory Visit | Attending: Radiation Oncology | Admitting: Radiation Oncology

## 2021-07-20 DIAGNOSIS — Z170 Estrogen receptor positive status [ER+]: Secondary | ICD-10-CM | POA: Diagnosis not present

## 2021-07-20 DIAGNOSIS — Z510 Encounter for antineoplastic radiation therapy: Secondary | ICD-10-CM | POA: Diagnosis not present

## 2021-07-20 DIAGNOSIS — C50511 Malignant neoplasm of lower-outer quadrant of right female breast: Secondary | ICD-10-CM | POA: Diagnosis not present

## 2021-07-21 ENCOUNTER — Ambulatory Visit
Admission: RE | Admit: 2021-07-21 | Discharge: 2021-07-21 | Disposition: A | Discharge status: Home / Self Care | Payer: Medicare PPO | Source: Ambulatory Visit | Attending: Radiation Oncology | Admitting: Radiation Oncology

## 2021-07-21 ENCOUNTER — Other Ambulatory Visit: Payer: Self-pay

## 2021-07-21 DIAGNOSIS — Z170 Estrogen receptor positive status [ER+]: Secondary | ICD-10-CM | POA: Diagnosis not present

## 2021-07-21 DIAGNOSIS — Z510 Encounter for antineoplastic radiation therapy: Secondary | ICD-10-CM | POA: Diagnosis not present

## 2021-07-21 DIAGNOSIS — C50511 Malignant neoplasm of lower-outer quadrant of right female breast: Secondary | ICD-10-CM | POA: Diagnosis not present

## 2021-07-22 ENCOUNTER — Ambulatory Visit
Admission: RE | Admit: 2021-07-22 | Discharge: 2021-07-22 | Disposition: A | Discharge status: Home / Self Care | Payer: Medicare PPO | Source: Ambulatory Visit | Attending: Radiation Oncology | Admitting: Radiation Oncology

## 2021-07-22 ENCOUNTER — Other Ambulatory Visit: Payer: Self-pay

## 2021-07-22 DIAGNOSIS — Z170 Estrogen receptor positive status [ER+]: Secondary | ICD-10-CM | POA: Diagnosis not present

## 2021-07-22 DIAGNOSIS — C50511 Malignant neoplasm of lower-outer quadrant of right female breast: Secondary | ICD-10-CM | POA: Diagnosis not present

## 2021-07-22 DIAGNOSIS — Z510 Encounter for antineoplastic radiation therapy: Secondary | ICD-10-CM | POA: Diagnosis not present

## 2021-07-23 ENCOUNTER — Ambulatory Visit
Admission: RE | Admit: 2021-07-23 | Discharge: 2021-07-23 | Disposition: A | Discharge status: Home / Self Care | Payer: Medicare PPO | Source: Ambulatory Visit | Attending: Radiation Oncology | Admitting: Radiation Oncology

## 2021-07-23 ENCOUNTER — Ambulatory Visit: Admission: RE | Admit: 2021-07-23 | Payer: Medicare PPO | Source: Ambulatory Visit | Admitting: Radiation Oncology

## 2021-07-23 ENCOUNTER — Other Ambulatory Visit: Payer: Self-pay

## 2021-07-23 DIAGNOSIS — Z510 Encounter for antineoplastic radiation therapy: Secondary | ICD-10-CM | POA: Diagnosis not present

## 2021-07-23 DIAGNOSIS — C50511 Malignant neoplasm of lower-outer quadrant of right female breast: Secondary | ICD-10-CM | POA: Diagnosis not present

## 2021-07-23 DIAGNOSIS — Z170 Estrogen receptor positive status [ER+]: Secondary | ICD-10-CM | POA: Diagnosis not present

## 2021-07-27 ENCOUNTER — Other Ambulatory Visit: Payer: Self-pay

## 2021-07-27 ENCOUNTER — Ambulatory Visit
Admission: RE | Admit: 2021-07-27 | Discharge: 2021-07-27 | Disposition: A | Discharge status: Home / Self Care | Payer: Medicare PPO | Source: Ambulatory Visit | Attending: Radiation Oncology | Admitting: Radiation Oncology

## 2021-07-27 DIAGNOSIS — C50511 Malignant neoplasm of lower-outer quadrant of right female breast: Secondary | ICD-10-CM | POA: Diagnosis not present

## 2021-07-27 DIAGNOSIS — Z510 Encounter for antineoplastic radiation therapy: Secondary | ICD-10-CM | POA: Diagnosis not present

## 2021-07-27 DIAGNOSIS — Z170 Estrogen receptor positive status [ER+]: Secondary | ICD-10-CM | POA: Insufficient documentation

## 2021-07-28 ENCOUNTER — Ambulatory Visit
Admission: RE | Admit: 2021-07-28 | Discharge: 2021-07-28 | Disposition: A | Discharge status: Home / Self Care | Payer: Medicare PPO | Source: Ambulatory Visit | Attending: Radiation Oncology | Admitting: Radiation Oncology

## 2021-07-28 DIAGNOSIS — C50511 Malignant neoplasm of lower-outer quadrant of right female breast: Secondary | ICD-10-CM | POA: Diagnosis not present

## 2021-07-28 DIAGNOSIS — Z170 Estrogen receptor positive status [ER+]: Secondary | ICD-10-CM | POA: Diagnosis not present

## 2021-07-28 DIAGNOSIS — Z510 Encounter for antineoplastic radiation therapy: Secondary | ICD-10-CM | POA: Diagnosis not present

## 2021-07-29 ENCOUNTER — Ambulatory Visit
Admission: RE | Admit: 2021-07-29 | Discharge: 2021-07-29 | Disposition: A | Discharge status: Home / Self Care | Payer: Medicare PPO | Source: Ambulatory Visit | Attending: Radiation Oncology | Admitting: Radiation Oncology

## 2021-07-29 ENCOUNTER — Other Ambulatory Visit: Payer: Self-pay

## 2021-07-29 DIAGNOSIS — Z510 Encounter for antineoplastic radiation therapy: Secondary | ICD-10-CM | POA: Diagnosis not present

## 2021-07-29 DIAGNOSIS — Z170 Estrogen receptor positive status [ER+]: Secondary | ICD-10-CM | POA: Diagnosis not present

## 2021-07-29 DIAGNOSIS — C50511 Malignant neoplasm of lower-outer quadrant of right female breast: Secondary | ICD-10-CM | POA: Diagnosis not present

## 2021-07-30 ENCOUNTER — Ambulatory Visit
Admission: RE | Admit: 2021-07-30 | Discharge: 2021-07-30 | Disposition: A | Discharge status: Home / Self Care | Payer: Medicare PPO | Source: Ambulatory Visit | Attending: Radiation Oncology | Admitting: Radiation Oncology

## 2021-07-30 ENCOUNTER — Ambulatory Visit: Payer: Medicare PPO | Admitting: Radiation Oncology

## 2021-07-30 ENCOUNTER — Ambulatory Visit: Payer: Medicare PPO

## 2021-07-30 DIAGNOSIS — Z510 Encounter for antineoplastic radiation therapy: Secondary | ICD-10-CM | POA: Diagnosis not present

## 2021-07-30 DIAGNOSIS — Z170 Estrogen receptor positive status [ER+]: Secondary | ICD-10-CM | POA: Diagnosis not present

## 2021-07-30 DIAGNOSIS — C50511 Malignant neoplasm of lower-outer quadrant of right female breast: Secondary | ICD-10-CM | POA: Diagnosis not present

## 2021-08-02 ENCOUNTER — Other Ambulatory Visit: Payer: Self-pay

## 2021-08-02 ENCOUNTER — Ambulatory Visit
Admission: RE | Admit: 2021-08-02 | Discharge: 2021-08-02 | Disposition: A | Discharge status: Home / Self Care | Payer: Medicare PPO | Source: Ambulatory Visit | Attending: Radiation Oncology | Admitting: Radiation Oncology

## 2021-08-02 DIAGNOSIS — Z170 Estrogen receptor positive status [ER+]: Secondary | ICD-10-CM | POA: Diagnosis not present

## 2021-08-02 DIAGNOSIS — C50511 Malignant neoplasm of lower-outer quadrant of right female breast: Secondary | ICD-10-CM | POA: Diagnosis not present

## 2021-08-02 DIAGNOSIS — Z510 Encounter for antineoplastic radiation therapy: Secondary | ICD-10-CM | POA: Diagnosis not present

## 2021-08-03 ENCOUNTER — Ambulatory Visit
Admission: RE | Admit: 2021-08-03 | Discharge: 2021-08-03 | Disposition: A | Discharge status: Home / Self Care | Payer: Medicare PPO | Source: Ambulatory Visit | Attending: Radiation Oncology | Admitting: Radiation Oncology

## 2021-08-03 DIAGNOSIS — C50511 Malignant neoplasm of lower-outer quadrant of right female breast: Secondary | ICD-10-CM | POA: Diagnosis not present

## 2021-08-03 DIAGNOSIS — Z170 Estrogen receptor positive status [ER+]: Secondary | ICD-10-CM | POA: Diagnosis not present

## 2021-08-03 DIAGNOSIS — Z510 Encounter for antineoplastic radiation therapy: Secondary | ICD-10-CM | POA: Diagnosis not present

## 2021-08-04 ENCOUNTER — Ambulatory Visit
Admission: RE | Admit: 2021-08-04 | Discharge: 2021-08-04 | Disposition: A | Discharge status: Home / Self Care | Payer: Medicare PPO | Source: Ambulatory Visit | Attending: Radiation Oncology | Admitting: Radiation Oncology

## 2021-08-04 ENCOUNTER — Other Ambulatory Visit: Payer: Self-pay

## 2021-08-04 ENCOUNTER — Ambulatory Visit: Payer: Medicare PPO

## 2021-08-04 DIAGNOSIS — C50511 Malignant neoplasm of lower-outer quadrant of right female breast: Secondary | ICD-10-CM | POA: Diagnosis not present

## 2021-08-04 DIAGNOSIS — Z170 Estrogen receptor positive status [ER+]: Secondary | ICD-10-CM | POA: Diagnosis not present

## 2021-08-04 DIAGNOSIS — Z510 Encounter for antineoplastic radiation therapy: Secondary | ICD-10-CM | POA: Diagnosis not present

## 2021-08-05 ENCOUNTER — Encounter: Payer: Self-pay | Admitting: Physician Assistant

## 2021-08-05 ENCOUNTER — Encounter: Payer: Self-pay | Admitting: Radiation Oncology

## 2021-08-05 ENCOUNTER — Ambulatory Visit: Payer: Medicare PPO

## 2021-08-05 ENCOUNTER — Ambulatory Visit
Admission: RE | Admit: 2021-08-05 | Discharge: 2021-08-05 | Disposition: A | Discharge status: Home / Self Care | Payer: Medicare PPO | Source: Ambulatory Visit | Attending: Radiation Oncology | Admitting: Radiation Oncology

## 2021-08-05 DIAGNOSIS — Z510 Encounter for antineoplastic radiation therapy: Secondary | ICD-10-CM | POA: Diagnosis not present

## 2021-08-05 DIAGNOSIS — Z170 Estrogen receptor positive status [ER+]: Secondary | ICD-10-CM | POA: Diagnosis not present

## 2021-08-05 DIAGNOSIS — C50511 Malignant neoplasm of lower-outer quadrant of right female breast: Secondary | ICD-10-CM | POA: Diagnosis not present

## 2021-08-06 ENCOUNTER — Ambulatory Visit: Payer: Medicare PPO

## 2021-08-09 ENCOUNTER — Ambulatory Visit: Payer: Medicare PPO

## 2021-08-09 NOTE — Progress Notes (Signed)
° °                                                                                                                                                          °  Patient Name: Janice Dudley MRN: [id number] DOB: December 25, 1950 Referring Physician: Iran Planas (Profile Not Attached) Date of Service: 08/05/2021 [facility], [place]                                                        End Of Treatment Note  Diagnoses: C50.511-Malignant neoplasm of lower-outer quadrant of right female breast  Cancer Staging: Stage IA, pT1aN0M0 grade 2, ER/PR positive invasive ductal carcinoma of the right breast.  Intent: Curative  Radiation Treatment Dates: 07/07/2021 through 08/05/2021 Site Technique Total Dose (Gy) Dose per Fx (Gy) Completed Fx Beam Energies  Breast, Right: Breast_R 3D 42.56/42.56 2.66 16/16 6X, 10X  Breast, Right: Breast_R_Bst 3D 8/8 2 4/4 10X   Narrative: The patient tolerated radiation therapy relatively well. She developed fatigue and anticipated skin changes in the treatment field.   Plan: The patient will receive a call in about one month from the radiation oncology department. She will continue follow up with Dr. Chryl Heck as well.   ________________________________________________    Carola Rhine, [facility]

## 2021-08-10 ENCOUNTER — Ambulatory Visit: Payer: Medicare PPO

## 2021-08-11 ENCOUNTER — Ambulatory Visit: Payer: Medicare PPO

## 2021-08-12 ENCOUNTER — Ambulatory Visit: Payer: Medicare PPO

## 2021-08-13 ENCOUNTER — Ambulatory Visit: Payer: Medicare PPO

## 2021-08-16 ENCOUNTER — Ambulatory Visit: Payer: Medicare PPO

## 2021-08-17 ENCOUNTER — Ambulatory Visit: Payer: Medicare PPO

## 2021-08-18 ENCOUNTER — Ambulatory Visit: Payer: Medicare PPO

## 2021-08-19 ENCOUNTER — Ambulatory Visit: Payer: Medicare PPO

## 2021-08-20 ENCOUNTER — Ambulatory Visit: Payer: Medicare PPO

## 2021-08-20 ENCOUNTER — Ambulatory Visit: Payer: Medicare PPO | Admitting: Radiation Oncology

## 2021-08-23 ENCOUNTER — Ambulatory Visit
Admission: RE | Admit: 2021-08-23 | Discharge: 2021-08-23 | Disposition: A | Discharge status: Home / Self Care | Payer: Medicare PPO | Source: Ambulatory Visit | Attending: Radiation Oncology | Admitting: Radiation Oncology

## 2021-08-23 ENCOUNTER — Ambulatory Visit: Payer: Medicare PPO

## 2021-08-23 DIAGNOSIS — C50511 Malignant neoplasm of lower-outer quadrant of right female breast: Secondary | ICD-10-CM

## 2021-08-23 DIAGNOSIS — Z170 Estrogen receptor positive status [ER+]: Secondary | ICD-10-CM

## 2021-08-23 NOTE — Progress Notes (Signed)
°  Radiation Oncology         [phone]) [phone] ________________________________  Name: Janice Dudley MRN: [id number]  Date of Service: 08/23/2021  DOB: [birth year]  Post Treatment Telephone Note  Diagnosis:   Stage IA, pT1aN0M0 grade 2, ER/PR positive invasive ductal carcinoma of the right breast.  Intent: Curative  Radiation Treatment Dates: 07/07/2021 through 08/05/2021 Site Technique Total Dose (Gy) Dose per Fx (Gy) Completed Fx Beam Energies  Breast, Right: Breast_R 3D 42.56/42.56 2.66 16/16 6X, 10X  Breast, Right: Breast_R_Bst 3D 8/8 2 4/4 10X   Narrative: The patient tolerated radiation therapy relatively well. She developed fatigue and anticipated skin changes in the treatment field. She reports her skin is doing well and she was able to travel to [place] and avoid direct sun exposure with her clothing choices. She was very tired from the long days at [facility], but is starting to get her energy back.    Impression/Plan: 1. Stage IA, pT1aN0M0 grade 2, ER/PR positive invasive ductal carcinoma of the right breast.. The patient has been doing well since completion of radiotherapy. We discussed that we would be happy to continue to follow her as needed, but she will also continue to follow up with Dr. Chryl Heck in medical oncology. She was counseled on skin care as well as measures to avoid sun exposure to this area.  2. Survivorship. We discussed the importance of survivorship evaluation and encouraged her to attend her upcoming visit with that clinic.     Carola Rhine, PAC

## 2021-08-24 ENCOUNTER — Ambulatory Visit: Payer: Medicare PPO

## 2021-08-25 ENCOUNTER — Ambulatory Visit: Payer: Medicare PPO

## 2021-08-26 ENCOUNTER — Encounter: Payer: Self-pay | Admitting: *Deleted

## 2021-08-26 ENCOUNTER — Ambulatory Visit: Payer: Medicare PPO

## 2021-08-26 DIAGNOSIS — Z170 Estrogen receptor positive status [ER+]: Secondary | ICD-10-CM

## 2021-08-26 DIAGNOSIS — C50511 Malignant neoplasm of lower-outer quadrant of right female breast: Secondary | ICD-10-CM

## 2021-08-27 ENCOUNTER — Other Ambulatory Visit: Payer: Self-pay

## 2021-08-27 ENCOUNTER — Ambulatory Visit: Payer: Medicare PPO

## 2021-08-27 ENCOUNTER — Ambulatory Visit (INDEPENDENT_AMBULATORY_CARE_PROVIDER_SITE_OTHER): Payer: Medicare PPO | Admitting: Physician Assistant

## 2021-08-27 DIAGNOSIS — Z0000 Encounter for general adult medical examination without abnormal findings: Secondary | ICD-10-CM | POA: Diagnosis not present

## 2021-08-27 NOTE — Patient Instructions (Addendum)
[place] Maintenance Summary and Written Plan of Care  Janice Dudley ,  Thank you for allowing me to perform your Medicare Annual Wellness Visit and for your ongoing commitment to your health.   Health Maintenance & Immunization History Health Maintenance  Topic Date Due   Zoster Vaccines- Shingrix (2 of 2) 11/24/2021 (Originally 06/20/2021)   MAMMOGRAM  04/13/2023   COLONOSCOPY (Pts 45-53yrs Insurance coverage will need to be confirmed)  05/23/2028   TETANUS/TDAP  12/25/2028   INFLUENZA VACCINE  Completed   DEXA SCAN  Completed   COVID-19 Vaccine  Completed   Hepatitis C Screening  Completed   HPV VACCINES  Aged Out   Pneumonia Vaccine 75+ Years old  Discontinued   Immunization History  Administered Date(s) Administered   Hepatitis B 09/06/2017   Influenza, High Dose Seasonal PF 05/15/2019   Influenza,inj,Quad PF,6+ Mos 04/11/2014   Influenza-Unspecified 06/09/2017, 05/18/2018, 04/25/2021   PFIZER(Purple Top)SARS-COV-2 Vaccination 08/28/2019, 09/18/2019, 04/24/2020   PPD Test 09/20/2013   Pfizer Covid-19 Vaccine Bivalent Booster 2yrs & up 06/24/2021   Pneumococcal Polysaccharide-23 01/09/2019   Pneumococcal-Unspecified 09/06/2017   Td 09/20/2013   Tdap 08/08/2013, 12/26/2018   Unspecified SARS-COV-2 Vaccination 08/19/2019, 09/09/2019, 04/24/2020   Zoster Recombinat (Shingrix) 04/25/2021   Zoster, Live 05/16/2014, 11/07/2017    These are the patient goals that we discussed:  Goals Addressed               This Visit's Progress     Patient Stated (pt-stated)        Would like to loose 5-10 lbs.         This is a list of Health Maintenance Items that are overdue or due now: Shingrix 2nd dose (Before April)  Orders/Referrals Placed Today: No orders of the defined types were placed in this encounter.  (Contact our referral department at [phone] if you have not spoken with someone about your referral appointment within the next  5 days)    Follow-up Plan Follow-up with [name], PA-C as planned Schedule your 2nd dose of Shingrix. Medicare wellness visit in one year. Patient will access AVS on my chart.      Health Maintenance, Female Adopting a healthy lifestyle and getting preventive care are important in promoting health and wellness. Ask your health care provider about: The right schedule for you to have regular tests and exams. Things you can do on your own to prevent diseases and keep yourself healthy. What should I know about diet, weight, and exercise? Eat a healthy diet  Eat a diet that includes plenty of vegetables, fruits, low-fat dairy products, and lean protein. Do not eat a lot of foods that are high in solid fats, added sugars, or sodium. Maintain a healthy weight Body mass index (BMI) is used to identify weight problems. It estimates body fat based on height and weight. Your health care provider can help determine your BMI and help you achieve or maintain a healthy weight. Get regular exercise Get regular exercise. This is one of the most important things you can do for your health. Most adults should: Exercise for at least 150 minutes each week. The exercise should increase your heart rate and make you sweat (moderate-intensity exercise). Do strengthening exercises at least twice a week. This is in addition to the moderate-intensity exercise. Spend less time sitting. Even light physical activity can be beneficial. Watch cholesterol and blood lipids Have your blood tested for lipids and cholesterol at 70 years of age,  then have this test every 5 years. Have your cholesterol levels checked more often if: Your lipid or cholesterol levels are high. You are older than 69 years of age. You are at high risk for heart disease. What should I know about cancer screening? Depending on your health history and family history, you may need to have cancer screening at various ages. This may  include screening for: Breast cancer. Cervical cancer. Colorectal cancer. Skin cancer. Lung cancer. What should I know about heart disease, diabetes, and high blood pressure? Blood pressure and heart disease High blood pressure causes heart disease and increases the risk of stroke. This is more likely to develop in people who have high blood pressure readings or are overweight. Have your blood pressure checked: Every 3-5 years if you are 104-45 years of age. Every year if you are 66 years old or older. Diabetes Have regular diabetes screenings. This checks your fasting blood sugar level. Have the screening done: Once every three years after age 51 if you are at a normal weight and have a low risk for diabetes. More often and at a younger age if you are overweight or have a high risk for diabetes. What should I know about preventing infection? Hepatitis B If you have a higher risk for hepatitis B, you should be screened for this virus. Talk with your health care provider to find out if you are at risk for hepatitis B infection. Hepatitis C Testing is recommended for: Everyone born from [birth year] through 1964. Anyone with known risk factors for hepatitis C. Sexually transmitted infections (STIs) Get screened for STIs, including gonorrhea and chlamydia, if: You are sexually active and are younger than 70 years of age. You are older than 70 years of age and your health care provider tells you that you are at risk for this type of infection. Your sexual activity has changed since you were last screened, and you are at increased risk for chlamydia or gonorrhea. Ask your health care provider if you are at risk. Ask your health care provider about whether you are at high risk for HIV. Your health care provider may recommend a prescription medicine to help prevent HIV infection. If you choose to take medicine to prevent HIV, you should first get tested for HIV. You should then be tested every 3 months  for as long as you are taking the medicine. Pregnancy If you are about to stop having your period (premenopausal) and you may become pregnant, seek counseling before you get pregnant. Take 400 to 800 micrograms (mcg) of folic acid every day if you become pregnant. Ask for birth control (contraception) if you want to prevent pregnancy. Osteoporosis and menopause Osteoporosis is a disease in which the bones lose minerals and strength with aging. This can result in bone fractures. If you are 54 years old or older, or if you are at risk for osteoporosis and fractures, ask your health care provider if you should: Be screened for bone loss. Take a calcium or vitamin D supplement to lower your risk of fractures. Be given hormone replacement therapy (HRT) to treat symptoms of menopause. Follow these instructions at home: Alcohol use Do not drink alcohol if: Your health care provider tells you not to drink. You are pregnant, may be pregnant, or are planning to become pregnant. If you drink alcohol: Limit how much you have to: 0-1 drink a day. Know how much alcohol is in your drink. In the [place] one drink equals one 12 oz  bottle of beer (355 mL), one 5 oz glass of wine (148 mL), or one 1 oz glass of hard liquor (44 mL). Lifestyle Do not use any products that contain nicotine or tobacco. These products include cigarettes, chewing tobacco, and vaping devices, such as e-cigarettes. If you need help quitting, ask your health care provider. Do not use street drugs. Do not share needles. Ask your health care provider for help if you need support or information about quitting drugs. General instructions Schedule regular health, dental, and eye exams. Stay current with your vaccines. Tell your health care provider if: You often feel depressed. You have ever been abused or do not feel safe at home. Summary Adopting a healthy lifestyle and getting preventive care are important in promoting health and  wellness. Follow your health care provider's instructions about healthy diet, exercising, and getting tested or screened for diseases. Follow your health care provider's instructions on monitoring your cholesterol and blood pressure. This information is not intended to replace advice given to you by your health care provider. Make sure you discuss any questions you have with your health care provider. Document Revised: 11/30/2020 Document Reviewed: 11/30/2020 Elsevier Patient Education  [place].

## 2021-08-27 NOTE — Progress Notes (Signed)
MEDICARE ANNUAL WELLNESS VISIT  08/27/2021  Telephone Visit Disclaimer This Medicare AWV was conducted by telephone due to national recommendations for restrictions regarding the COVID-19 Pandemic (e.g. social distancing).  I verified, using two identifiers, that I am speaking with Janice Dudley or their authorized healthcare agent. I discussed the limitations, risks, security, and privacy concerns of performing an evaluation and management service by telephone and the potential availability of an in-person appointment in the future. The patient expressed understanding and agreed to proceed.  Location of Patient: Home Location of Provider (nurse):  In the office.  Subjective:    Janice Dudley is a 71 y.o. female patient of [name], PA-C who had a [employer] Visit today via telephone. Janice Dudley is Retired and lives with their family. she has 2 children. she reports that she is socially active and does interact with friends/family regularly. she is minimally physically active and enjoys reading and adult coloring..  Patient Care Team: Lavada Mesi as PCP - General (Family Medicine) Mauro Kaufmann, RN as Oncology Nurse Navigator Rockwell Germany, RN as Oncology Nurse Navigator Coralie Keens, MD as Consulting Physician (General Surgery) Magrinat, [name], MD (Inactive) as Consulting Physician (Oncology) [name], MD as Consulting Physician (Radiation Oncology) [name], MD as Consulting Physician (Obstetrics and Gynecology) Silverio Decamp, MD as Consulting Physician (Sports Medicine) [name], MD as Referring Physician (Specialist)  Advanced Directives 08/27/2021 06/29/2021 05/31/2021 05/20/2021 06/02/2020 01/21/2019 03/12/2014  Does Patient Have a Medical Advance Directive? Yes Yes No No No Yes No  Type of Advance Directive Living will;Healthcare Power of Attorney Living will;Healthcare Power of [place];Living will -  Does patient want to make changes to medical advance directive? No - Patient declined No - Patient declined - - - No - Patient declined -  Copy of [place] in Chart? No - copy requested - - - - No - copy requested -  Would patient like information on creating a medical advance directive? - - No - Patient declined No - Patient declined No - Patient declined - -    Hospital Utilization Over the Past 12 Months: # of hospitalizations or ER visits: 0 # of surgeries: 2  Review of Systems    Patient reports that her overall health is better compared to last year.  History obtained from chart review and the patient  Patient Reported Readings (BP, Pulse, CBG, Weight, etc) none  Pain Assessment Pain : No/denies pain     Current Medications & Allergies (verified) Allergies as of 08/27/2021       Reactions   Peach [prunus Persica]    Hives/itching   Sulfa Antibiotics Hives   Topamax [topiramate]    Bilateral leg pain   Shellfish Allergy Hives        Medication List        Accurate as of August 27, 2021  9:24 AM. If you have any questions, ask your nurse or doctor.          albuterol 108 (90 Base) MCG/ACT inhaler Commonly known as: VENTOLIN HFA INHALE 2 PUFFS INTO THE LUNGS EVERY 6 HOURS AS NEEDED FOR WHEEZING OR SHORTNESS OF BREATH   anastrozole 1 MG tablet Commonly known as: ARIMIDEX Take 1 tablet (1 mg total) by mouth daily.   aspirin 81 MG tablet Take 81 mg by mouth daily.   atorvastatin 40 MG tablet Commonly known as:  LIPITOR Take 1 tablet (40 mg total) by mouth daily.   b complex vitamins tablet Take 1 tablet by mouth daily.   buPROPion 150 MG 12 hr tablet Commonly known as: Wellbutrin SR Take 1 tablet (150 mg total) by mouth 2 (two) times daily.   calcium carbonate 750 MG chewable tablet Commonly known as: TUMS EX Chew 1 tablet by mouth daily.   cholecalciferol 1000 units  tablet Commonly known as: VITAMIN D Take 1,000 Units by mouth daily.   clobetasol 0.05 % topical foam Commonly known as: Olux Apply topically 2 (two) times daily.   hydrochlorothiazide 12.5 MG tablet Commonly known as: HYDRODIURIL Take 1 tablet (12.5 mg total) by mouth daily.   KRILL OIL PO Take by mouth.   Magnesium 250 MG Tabs Take by mouth daily.   MELATONIN PO Take 10 mg by mouth at bedtime.   METRONIDAZOLE (TOPICAL) 0.75 % Lotn Apply 1 application topically in the morning and at bedtime.   traZODone 50 MG tablet Commonly known as: DESYREL Take 1-2 tablets (50-100 mg total) by mouth at bedtime. 1 HOUR BEFORE BEDTIME AS NEEDED        History (reviewed): Past Medical History:  Diagnosis Date   Allergy 1960s, 1970s, 2000   Arthritis    Breast cancer ([facility])    Cataract    no surgery yet   Hyperlipidemia    Past Surgical History:  Procedure Laterality Date   BREAST EXCISIONAL BIOPSY Left 1999   BREAST LUMPECTOMY WITH RADIOACTIVE SEED AND SENTINEL LYMPH NODE BIOPSY Right 05/31/2021   Procedure: RIGHT BREAST LUMPECTOMY WITH RADIOACTIVE SEED AND SENTINEL LYMPH NODE BIOPSY;  Surgeon: Coralie Keens, MD;  Location: [facility];  Service: General;  Laterality: Right;   BREAST SURGERY  1999   Lumpectomy   CESAREAN SECTION  1982/1983   2 times   COLONOSCOPY  2008   EYE SURGERY  01/2021   Vitrectomy   LAPAROSCOPY     abd/tubal ligation   TUBAL LIGATION  1988   VITRECTOMY     Family History  Problem Relation Age of Onset   Stroke Mother    Heart attack Mother 49   Cancer Mother        Uterine   Breast cancer Mother 66   Heart attack Father 8   Cancer Father        Lung   Stroke Father    Hyperlipidemia Sister    Breast cancer Sister 73   Cancer Sister    Hyperlipidemia Sister    Thyroid nodules Brother    Hyperlipidemia Brother    Prostate cancer Brother 48   Cancer Brother    Hyperlipidemia Brother    Lung cancer Brother 28    Thyroid cancer Brother        dx. early 29s   Cancer Brother    Hyperlipidemia Brother    Anxiety disorder Daughter    Depression Daughter    Colon cancer Neg Hx    Esophageal cancer Neg Hx    Rectal cancer Neg Hx    Social History   Socioeconomic History   Marital status: Married    Spouse name: John   Number of children: 2   Years of education: 18   Highest education level: Master's degree (e.g., MA, MS, MEng, MEd, MSW, MBA)  Occupational History   Occupation: Pharmacist, hospital    Comment: retired  Tobacco Use   Smoking status: Former    Packs/day: 0.25    Years: 20.00  Pack years: 5.00    Types: Cigarettes    Quit date: 05/09/1989    Years since quitting: 32.3   Smokeless tobacco: Never  Vaping Use   Vaping Use: Never used  Substance and Sexual Activity   Alcohol use: Yes    Alcohol/week: 1.0 - 2.0 standard drink    Types: 1 - 2 Glasses of wine per week    Comment: wine    Drug use: No   Sexual activity: Not Currently    Birth control/protection: Post-menopausal  Other Topics Concern   Not on file  Social History Narrative   Lives with her husband, daughter and granddaughter. She has two children. She enjoys reading and adult coloring.   Social Determinants of Health   Financial Resource Strain: Low Risk    Difficulty of Paying Living Expenses: Not hard at all  Food Insecurity: No Food Insecurity   Worried About Charity fundraiser in the Last Year: Never true   [place] in the Last Year: Never true  Transportation Needs: No Transportation Needs   Lack of Transportation (Medical): No   Lack of Transportation (Non-Medical): No  Physical Activity: Inactive   Days of Exercise per Week: 0 days   Minutes of Exercise per Session: 0 min  Stress: No Stress Concern Present   Feeling of Stress : Not at all  Social Connections: Socially Integrated   Frequency of Communication with Friends and Family: More than three times a week   Frequency of Social Gatherings  with Friends and Family: Once a week   Attends Religious Services: More than 4 times per year   Active Member of [employer] or Organizations: Yes   Attends Archivist Meetings: More than 4 times per year   Marital Status: Married    Activities of Daily Living In your present state of health, do you have any difficulty performing the following activities: 08/27/2021 08/25/2021  Hearing? Y Y  Comment Left ear hearing loss. -  Vision? N N  Difficulty concentrating or making decisions? N N  Walking or climbing stairs? Y Y  Comment sometimes. -  Dressing or bathing? N N  Doing errands, shopping? N Y  Comment patient marked yes on the prequestionairre. -  Preparing Food and eating ? N N  Using the Toilet? N N  In the past six months, have you accidently leaked urine? N N  Do you have problems with loss of bowel control? N N  Managing your Medications? N N  Managing your Finances? N N  Housekeeping or managing your Housekeeping? N N  Some recent data might be hidden    Patient Education/ Literacy How often do you need to have someone help you when you read instructions, pamphlets, or other written materials from your doctor or pharmacy?: 1 - Never What is the last grade level you completed in school?: Bachelor's degree  Exercise Current Exercise Habits: Home exercise routine, Type of exercise: strength training/weights;stretching, Time (Minutes): 20, Frequency (Times/Week): 7, Weekly Exercise (Minutes/Week): 140, Intensity: Mild, Exercise limited by: orthopedic condition(s)  Diet Patient reports consuming 3 meals a day and 1 snack(s) a day Patient reports that her primary diet is: Regular Patient reports that she does have regular access to food.   Depression Screen PHQ 2/9 Scores 08/27/2021 06/30/2021 12/25/2020 05/26/2020 01/21/2019 01/09/2019 06/22/2017  PHQ - 2 Score 0 1 0 0 0 0 0  PHQ- 9 Score 0 5 3 3 1 1  -  Fall Risk Fall Risk  08/27/2021 08/25/2021 06/30/2021 05/26/2020 01/21/2019   Falls in the past year? 0 0 0 0 1  Number falls in past yr: 0 0 0 0 0  Injury with Fall? 0 0 0 0 1  Risk for fall due to : No Fall Risks - No Fall Risks - Other (Comment)  Risk for fall due to: Comment - - - - fell getting off boat  Follow up Falls evaluation completed - Falls evaluation completed Falls evaluation completed Falls prevention discussed     Objective:  Janice Dudley seemed alert and oriented and she participated appropriately during our telephone visit.  Blood Pressure Weight BMI  BP Readings from Last 3 Encounters:  06/30/21 122/75  06/29/21 118/69  06/14/21 140/69   Wt Readings from Last 3 Encounters:  06/30/21 186 lb (84.4 kg)  06/29/21 188 lb (85.3 kg)  06/14/21 186 lb (84.4 kg)   BMI Readings from Last 1 Encounters:  06/30/21 35.14 kg/m    *Unable to obtain current vital signs, weight, and BMI due to telephone visit type  Hearing/Vision  Janice Dudley did not seem to have difficulty with hearing/understanding during the telephone conversation Reports that she has had a formal eye exam by an eye care professional within the past year Reports that she has not had a formal hearing evaluation within the past year *Unable to fully assess hearing and vision during telephone visit type  Cognitive Function: 6CIT Screen 08/27/2021 05/26/2020 01/21/2019  What Year? 0 points 0 points 0 points  What month? 0 points 0 points 0 points  What time? 0 points 0 points 0 points  Count back from 20 0 points 0 points 0 points  Months in reverse 0 points 0 points 0 points  Repeat phrase 0 points 2 points 0 points  Total Score 0 2 0   (Normal:0-7, Significant for Dysfunction: >8)  Normal Cognitive Function Screening: Yes   Immunization & Health Maintenance Record Immunization History  Administered Date(s) Administered   Hepatitis B 09/06/2017   Influenza, High Dose Seasonal PF 05/15/2019   Influenza,inj,Quad PF,6+ Mos 04/11/2014   Influenza-Unspecified 06/09/2017,  05/18/2018, 04/25/2021   PFIZER(Purple Top)SARS-COV-2 Vaccination 08/28/2019, 09/18/2019, 04/24/2020   PPD Test 09/20/2013   Pfizer Covid-19 Vaccine Bivalent Booster 10yrs & up 06/24/2021   Pneumococcal Polysaccharide-23 01/09/2019   Pneumococcal-Unspecified 09/06/2017   Td 09/20/2013   Tdap 08/08/2013, 12/26/2018   Unspecified SARS-COV-2 Vaccination 08/19/2019, 09/09/2019, 04/24/2020   Zoster Recombinat (Shingrix) 04/25/2021   Zoster, Live 05/16/2014, 11/07/2017    Health Maintenance  Topic Date Due   Zoster Vaccines- Shingrix (2 of 2) 11/24/2021 (Originally 06/20/2021)   MAMMOGRAM  04/13/2023   COLONOSCOPY (Pts 45-37yrs Insurance coverage will need to be confirmed)  05/23/2028   TETANUS/TDAP  12/25/2028   INFLUENZA VACCINE  Completed   DEXA SCAN  Completed   COVID-19 Vaccine  Completed   Hepatitis C Screening  Completed   HPV VACCINES  Aged Out   Pneumonia Vaccine 69+ Years old  Discontinued       Assessment  This is a routine wellness examination for [employer]  Health Maintenance: Due or Overdue There are no preventive care reminders to display for this patient.   Janice Dudley does not need a referral for [employer]: Care Management:   no Social Work:    no Prescription Assistance:  no Nutrition/Diabetes Education:  no   Plan:  Personalized Goals  Goals Addressed  This Visit's Progress     Patient Stated (pt-stated)        Would like to loose 5-10 lbs.       Personalized Health Maintenance & Screening Recommendations  Shingrix 2nd dose (Before April)  Lung Cancer Screening Recommended: no (Low Dose CT Chest recommended if Age 61-80 years, 30 pack-year currently smoking OR have quit w/in past 15 years) Hepatitis C Screening recommended: no HIV Screening recommended: no  Advanced Directives: Written information was not prepared per patient's request.  Referrals & Orders No orders of the defined  types were placed in this encounter.   Follow-up Plan Follow-up with [name], PA-C as planned Schedule your 2nd dose of Shingrix. Medicare wellness visit in one year. Patient will access AVS on my chart.   I have personally reviewed and noted the following in the patients chart:   Medical and social history Use of alcohol, tobacco or illicit drugs  Current medications and supplements Functional ability and status Nutritional status Physical activity Advanced directives List of other physicians Hospitalizations, surgeries, and ER visits in previous 12 months Vitals Screenings to include cognitive, depression, and falls Referrals and appointments  In addition, I have reviewed and discussed with Janice Dudley certain preventive protocols, quality metrics, and best practice recommendations. A written personalized care plan for preventive services as well as general preventive health recommendations is available and can be mailed to the patient at her request.      Janice Gens, RN  08/27/2021

## 2021-08-30 ENCOUNTER — Ambulatory Visit: Payer: Medicare PPO

## 2021-09-07 ENCOUNTER — Other Ambulatory Visit: Payer: Self-pay

## 2021-09-07 ENCOUNTER — Ambulatory Visit (INDEPENDENT_AMBULATORY_CARE_PROVIDER_SITE_OTHER): Payer: Medicare PPO

## 2021-09-07 ENCOUNTER — Ambulatory Visit: Payer: Medicare PPO | Admitting: Sports Medicine

## 2021-09-07 DIAGNOSIS — M170 Bilateral primary osteoarthritis of knee: Secondary | ICD-10-CM

## 2021-09-07 NOTE — Assessment & Plan Note (Signed)
This is a pleasant 71 year old female, known bilateral knee osteoarthritis, last injection was in July 2022, now having recurrence of pain bilaterally medial joint lines. Today we did bilateral knee joint injections, if this fails we can proceed with viscosupplementation. She did have a trip to [place] in January of this year which went really well. Return to see me as needed.

## 2021-09-07 NOTE — Progress Notes (Signed)
° ° °  Procedures performed today:    Procedure: Real-time Ultrasound Guided injection of the left knee Device: Samsung HS60  Verbal informed consent obtained.  Time-out conducted.  Noted no overlying erythema, induration, or other signs of local infection.  Skin prepped in a sterile fashion.  Local anesthesia: Topical Ethyl chloride.  With sterile technique and under real time ultrasound guidance: Noted trace effusion, 1 cc Kenalog 40, 2 cc lidocaine, 2 cc bupivacaine injected easily Completed without difficulty  Advised to call if fevers/chills, erythema, induration, drainage, or persistent bleeding.  Images permanently stored and available for review in PACS.  Impression: Technically successful ultrasound guided injection.  Procedure: Real-time Ultrasound Guided injection of the right knee Device: Samsung HS60  Verbal informed consent obtained.  Time-out conducted.  Noted no overlying erythema, induration, or other signs of local infection.  Skin prepped in a sterile fashion.  Local anesthesia: Topical Ethyl chloride.  With sterile technique and under real time ultrasound guidance: Noted trace effusion, 1 cc Kenalog 40, 2 cc lidocaine, 2 cc bupivacaine injected easily Completed without difficulty  Advised to call if fevers/chills, erythema, induration, drainage, or persistent bleeding.  Images permanently stored and available for review in PACS.  Impression: Technically successful ultrasound guided injection.  Independent interpretation of notes and tests performed by another provider:   None.  Brief History, Exam, Impression, and Recommendations:    Primary osteoarthritis of both knees This is a pleasant 71 year old female, known bilateral knee osteoarthritis, last injection was in July 2022, now having recurrence of pain bilaterally medial joint lines. Today we did bilateral knee joint injections, if this fails we can proceed with viscosupplementation. She did have a trip to  [place] in January of this year which went really well. Return to see me as needed.  Chronic process with exacerbation and pharmacologic intervention  ___________________________________________ [name]. [name], M.D., ABFM., CAQSM. Primary Care and [place] Instructor of [place] [employer] [facility] [employer] Medicine

## 2021-09-16 ENCOUNTER — Encounter (HOSPITAL_COMMUNITY): Payer: Self-pay

## 2021-09-30 ENCOUNTER — Telehealth: Payer: Self-pay | Admitting: *Deleted

## 2021-10-04 ENCOUNTER — Other Ambulatory Visit: Payer: Self-pay

## 2021-10-04 ENCOUNTER — Ambulatory Visit: Payer: Medicare PPO | Attending: Surgery

## 2021-10-04 VITALS — Wt 185.0 lb

## 2021-10-04 DIAGNOSIS — Z483 Aftercare following surgery for neoplasm: Secondary | ICD-10-CM | POA: Insufficient documentation

## 2021-10-04 NOTE — Therapy (Signed)
[facility] ?[facility] [place] ?[place], [place], [address] ?Phone: [phone]   Fax:  [phone] ? ?Physical Therapy Treatment ? ?Patient Details  ?Name: Janice Dudley ?MRN: [id number] ?Date of Birth: Nov 05, 1950 ?Referring Provider (PT): Dr. Ninfa Linden ? ? ?Encounter Date: 10/04/2021 ? ? PT End of Session - 10/04/21 1028   ? ? Visit Number 1   # unchanged due to screen only  ? PT Start Time 1026   ? PT Stop Time 6659   ? PT Time Calculation (min) 7 min   ? Activity Tolerance Patient tolerated treatment well   ? Behavior During Therapy [facility] for tasks assessed/performed   ? ?  ?  ? ?  ? ? ?Past Medical History:  ?Diagnosis Date  ? Allergy 1960s, 1970s, 2000  ? Arthritis   ? Breast cancer ([place])   ? Cataract   ? no surgery yet  ? Hyperlipidemia   ? ? ?Past Surgical History:  ?Procedure Laterality Date  ? BREAST EXCISIONAL BIOPSY Left 1999  ? BREAST LUMPECTOMY WITH RADIOACTIVE SEED AND SENTINEL LYMPH NODE BIOPSY Right 05/31/2021  ? Procedure: RIGHT BREAST LUMPECTOMY WITH RADIOACTIVE SEED AND SENTINEL LYMPH NODE BIOPSY;  Surgeon: Coralie Keens, MD;  Location: [facility];  Service: General;  Laterality: Right;  ? [place]  ? Lumpectomy  ? CESAREAN SECTION  1982/1983  ? 2 times  ? COLONOSCOPY  2008  ? EYE SURGERY  01/2021  ? Vitrectomy  ? LAPAROSCOPY    ? abd/tubal ligation  ? TUBAL LIGATION  1988  ? VITRECTOMY    ? ? ?Vitals:  ? 10/04/21 1027  ?Weight: 185 lb (83.9 kg)  ? ? ? Subjective Assessment - 10/04/21 1024   ? ? Subjective Pt returns for her 3 month L-Dex screen.   ? Pertinent History Rt lumpectomy with SLNB 05/31/21 with Dr. Ninfa Linden 0/3 lymph nodes removed.  Starting radiation hopefully in February after a trip to [place].  Hx of lumpectomy on the left in 1999 but no lymph node removal. Knee OA   ? ?  ?  ? ?  ? ? ? ? ? ? ? ? ? L-DEX FLOWSHEETS - 10/04/21 1000   ? ?  ? L-DEX LYMPHEDEMA SCREENING  ? Measurement Type Unilateral    ? L-DEX MEASUREMENT EXTREMITY Upper Extremity   ? POSITION  Standing   ? DOMINANT SIDE Right   ? At Risk Side Right   ? BASELINE SCORE (UNILATERAL) -9.2   ? L-DEX SCORE (UNILATERAL) -3   ? VALUE CHANGE (UNILAT) 6.2   ? ?  ?  ? ?  ? ? ? ? ? ? ? ? ? ? ? ? ? ? ? ? ? ? ? ? ? ? ? ? ? ? ? ? ? ? ? ? ? Plan - 10/04/21 1033   ? ? Clinical Impression Statement Pt returns for her 3 month L-Dex screen. Her change from baseline of 6.2 is WNLs so no further treatment is required at this time except to cont every 3 month L-Dex screens which pt is agreeable to.   ? PT Next Visit Plan Cont every 3 month L-Dex screens for up to 2 years from her SLNB (~06/01/2023)   ? Consulted and Agree with Plan of Care Patient   ? ?  ?  ? ?  ? ? ?Patient will benefit from skilled therapeutic intervention in order to improve the following deficits and impairments:    ? ?  Visit Diagnosis: ?Aftercare following surgery for neoplasm ? ? ? ? ?Problem List ?Patient Active Problem List  ? Diagnosis Date Noted  ? Lower extremity edema 07/02/2021  ? Seborrheic dermatitis 07/02/2021  ? Genetic testing 05/10/2021  ? Family history of breast cancer 04/29/2021  ? Family history of uterine cancer 04/29/2021  ? Family history of prostate cancer 04/29/2021  ? Malignant neoplasm of lower-outer quadrant of right breast of female, estrogen receptor positive ([place]) 04/23/2021  ? Elevated liver enzymes 12/28/2020  ? Primary osteoarthritis of both knees 05/27/2020  ? Mixed hyperlipidemia 05/26/2020  ? Perceived hearing changes 05/26/2020  ? Left breast mass 01/14/2019  ? Hypotension 01/14/2019  ? Laceration of right lower extremity 01/14/2019  ? H/O osteopenia 11/29/2017  ? Strain of abdominal muscle 09/28/2017  ? No energy 06/22/2017  ? Trouble in sleeping 06/22/2017  ? Atypical pigmented skin lesion 03/02/2016  ? Post-menopausal 03/02/2016  ? Abnormal weight gain 05/16/2014  ? Hypertriglyceridemia 04/15/2014  ? Obesity 04/15/2014  ? Fibrocystic breast 03/25/2014  ?  Hyperlipidemia   ? ? ?Otelia Limes, PTA ?10/04/2021, 10:35 AM ? ?[facility] ?[facility] [place] ?[place], [place], [address] ?Phone: [phone]   Fax:  [phone] ? ?Name: Janice Dudley ?MRN: [id number] ?Date of Birth: Feb 12, 1951 ? ? ? ?

## 2021-10-06 ENCOUNTER — Ambulatory Visit (INDEPENDENT_AMBULATORY_CARE_PROVIDER_SITE_OTHER): Payer: Medicare PPO | Admitting: Physician Assistant

## 2021-10-06 ENCOUNTER — Encounter: Payer: Self-pay | Admitting: Physician Assistant

## 2021-10-06 ENCOUNTER — Other Ambulatory Visit: Payer: Self-pay

## 2021-10-06 VITALS — BP 135/83 | HR 70 | Temp 98.5°F | Ht 61.0 in | Wt 187.0 lb

## 2021-10-06 DIAGNOSIS — N611 Abscess of the breast and nipple: Secondary | ICD-10-CM | POA: Diagnosis not present

## 2021-10-06 MED ORDER — DOXYCYCLINE HYCLATE 100 MG PO TABS: 100.0000 mg | ORAL_TABLET | Freq: Two times a day (BID) | ORAL | 0 refills | Status: DC

## 2021-10-06 NOTE — Progress Notes (Signed)
? ?  Subjective:  ? ? Patient ID: Janice Dudley, female    DOB: Feb 14, 1951, 71 y.o.   MRN: [id number] ? ?HPI ?[name] Losano is a 71 yo female with a history of breast cancer of the right breast presenting to the clinic today with a breast abscess. ? ?Pt reports she noticed a "pimple" on her lower, right breast 5 days ago. It has since gotten larger and more painful. She also reports warmth and breast heaviness. She denies any fever or chills.  ? ?Last radiation treatment completed was Aug 05, 2021. ? ? ?Review of Systems ?All other pertinent ROS were reviewed and were negative. ?   ?Objective:  ? Physical Exam ? ?Skin: ?- Two small openings in the skin with purulent, white drainage on the lower, right breast. Abscess total area about 1cm by 1cm Erythema present surrounding the area. Area is exquisitely tender to light palpation.  ? ? ?   ?Assessment & Plan:  ?..Orah was seen today for breast problem. ? ?Diagnoses and all orders for this visit: ? ?Breast abscess ?-     doxycycline (VIBRA-TABS) 100 MG tablet; Take 1 tablet (100 mg total) by mouth 2 (two) times daily. ?-     Anaerobic and Aerobic Culture ? ? ? ? ?71 yo with history of breast cancer presents with an infected breast abscess. Given patient has completed a radiation treatment along with her clinical symptoms of warmth, erythema, tenderness, and openings in the skin we will treat her for suspected MRSA infection.  ? ?Plan: ?- Cultured wound for sensitivity ?- Clean site and cover with non-adhesive bandaid. ?- Start on Doxycycline '100mg'$  BID PO for suspected MRSA infection ?- Educated to clean site often with warm water and continue to drain site. ?- Call back to office if infection worsens (fever, chills, increasing pain) or does not get better within 3 days.  ? ?

## 2021-10-06 NOTE — Patient Instructions (Signed)
Skin Abscess ?A skin abscess is an infected area of your skin that contains pus and other material. An abscess can happen in any part of your body. Some abscesses break open (rupture) on their own. Most continue to get worse unless they are treated. The infection can spread deeper into the body and into your blood, which can make you feel sick. ?A skin abscess is caused by germs that enter the skin through a cut or scrape. It can also be caused by blocked oil and sweat glands or infected hair follicles. ?This condition is usually treated by: ?Draining the pus. ?Taking antibiotic medicines. ?Placing a warm, wet washcloth over the abscess. ?Follow these instructions at home: ?Medicines ? ?Take over-the-counter and prescription medicines only as told by your doctor. ?If you were prescribed an antibiotic medicine, take it as told by your doctor. Do not stop taking the antibiotic even if you start to feel better. ?Abscess care ? ?If you have an abscess that has not drained, place a warm, clean, wet washcloth over the abscess several times a day. Do this as told by your doctor. ?Follow instructions from your doctor about how to take care of your abscess. Make sure you: ?Cover the abscess with a bandage (dressing). ?Change your bandage or gauze as told by your doctor. ?Wash your hands with soap and water before you change the bandage or gauze. If you cannot use soap and water, use hand sanitizer. ?Check your abscess every day for signs that the infection is getting worse. Check for: ?More redness, swelling, or pain. ?More fluid or blood. ?Warmth. ?More pus or a bad smell. ?General instructions ?To avoid spreading the infection: ?Do not share personal care items, towels, or hot tubs with others. ?Avoid making skin-to-skin contact with other people. ?Keep all follow-up visits as told by your doctor. This is important. ?Contact a doctor if: ?You have more redness, swelling, or pain around your abscess. ?You have more fluid or  blood coming from your abscess. ?Your abscess feels warm when you touch it. ?You have more pus or a bad smell coming from your abscess. ?You have a fever. ?Your muscles ache. ?You have chills. ?You feel sick. ?Get help right away if: ?You have very bad (severe) pain. ?You see red streaks on your skin spreading away from the abscess. ?Summary ?A skin abscess is an infected area of your skin that contains pus and other material. ?The abscess is caused by germs that enter the skin through a cut or scrape. It can also be caused by blocked oil and sweat glands or infected hair follicles. ?Follow your doctor's instructions on caring for your abscess, taking medicines, preventing infections, and keeping follow-up visits. ?This information is not intended to replace advice given to you by your health care provider. Make sure you discuss any questions you have with your health care provider. ?Document Revised: 02/14/2019 Document Reviewed: 08/24/2017 ?Elsevier Patient Education ? [place]. ? ?

## 2021-10-08 ENCOUNTER — Other Ambulatory Visit: Payer: Self-pay

## 2021-10-08 ENCOUNTER — Ambulatory Visit (INDEPENDENT_AMBULATORY_CARE_PROVIDER_SITE_OTHER): Payer: Medicare PPO | Admitting: Physician Assistant

## 2021-10-08 ENCOUNTER — Encounter: Payer: Self-pay | Admitting: Physician Assistant

## 2021-10-08 ENCOUNTER — Other Ambulatory Visit: Payer: Self-pay | Admitting: Physician Assistant

## 2021-10-08 VITALS — BP 135/68 | HR 68 | Ht 61.0 in | Wt 187.0 lb

## 2021-10-08 DIAGNOSIS — N611 Abscess of the breast and nipple: Secondary | ICD-10-CM | POA: Diagnosis not present

## 2021-10-08 MED ORDER — CLINDAMYCIN HCL 300 MG PO CAPS: 300.0000 mg | ORAL_CAPSULE | Freq: Three times a day (TID) | ORAL | 0 refills | Status: DC

## 2021-10-08 NOTE — Progress Notes (Signed)
? ?Subjective:  ? ? Patient ID: Janice Dudley, female    DOB: 02-Apr-1951, 71 y.o.   MRN: [id number] ? ?HPI ?Pt is a 71 yo female with right inner lower breast abscess who presents to the clinic to follow up after 3 day of doxycycline. She denies any fever, chills, nausea, vomiting. Her wound continues to drain some and tender. She does feel like the redness has gone down some around the wound.  ? ?.. ?Active Ambulatory Problems  ?  Diagnosis Date Noted  ? Hyperlipidemia   ? Fibrocystic breast 03/25/2014  ? Hypertriglyceridemia 04/15/2014  ? Obesity 04/15/2014  ? Abnormal weight gain 05/16/2014  ? Atypical pigmented skin lesion 03/02/2016  ? Post-menopausal 03/02/2016  ? No energy 06/22/2017  ? Trouble in sleeping 06/22/2017  ? Strain of abdominal muscle 09/28/2017  ? H/O osteopenia 11/29/2017  ? Left breast mass 01/14/2019  ? Hypotension 01/14/2019  ? Laceration of right lower extremity 01/14/2019  ? Mixed hyperlipidemia 05/26/2020  ? Perceived hearing changes 05/26/2020  ? Primary osteoarthritis of both knees 05/27/2020  ? Elevated liver enzymes 12/28/2020  ? Malignant neoplasm of lower-outer quadrant of right breast of female, estrogen receptor positive ([place]) 04/23/2021  ? Family history of breast cancer 04/29/2021  ? Family history of uterine cancer 04/29/2021  ? Family history of prostate cancer 04/29/2021  ? Genetic testing 05/10/2021  ? Lower extremity edema 07/02/2021  ? Seborrheic dermatitis 07/02/2021  ? Malignant tumor of breast ([place]) 04/26/2021  ? ?Resolved Ambulatory Problems  ?  Diagnosis Date Noted  ? Sprain of ankle 05/16/2014  ? Obesity (BMI 30.0-34.9) 06/22/2017  ? ?Past Medical History:  ?Diagnosis Date  ? Allergy 1960s, 1970s, 2000  ? Arthritis   ? Breast cancer ([place])   ? Cataract   ? ? ? ?Review of Systems ?See HPI.  ?   ?Objective:  ? Physical Exam ?Cardiovascular:  ?   Rate and Rhythm: Normal rate and regular rhythm.  ?   Pulses: Normal pulses.  ?Chest:  ? ? ? ? ?Incision and Drainage  Procedure Note ?Diagnosis: inflamed epidermoid cyst ?abscess ?Location: see physical exam ?Informed Consent: Discussed risks (permanent scarring, light or dark discoloration, infection, pain, bleeding, bruising, redness, damage to adjacent structures, and recurrence of the lesion) and benefits of the procedure, as well as the alternatives.  Informed consent was obtained. ?Preparation: The area was prepared and draped in a standard fashion. ?Anesthesia: Lidocaine 1% without epinephrine ?Procedure Details: An incision was made overlying the lesion. The lesion drained pus and white chalky material with appearance of cyst sac. ?A small amount of fluid was drained.    ?Iodoform used to pack wound. ?Antibiotic ointment and a sterile pressure dressing were applied. The patient tolerated procedure well. ?Total number of lesions drained: 1 ?Plan: The patient was instructed on post-op care. Recommend OTC analgesia as needed for pain. ? ? ? ?   ?Assessment & Plan:  ?..Jett was seen today for follow-up. ? ?Diagnoses and all orders for this visit: ? ?Breast abscess ?-     clindamycin (CLEOCIN) 300 MG capsule; Take 1 capsule (300 mg total) by mouth 3 (three) times daily. ? ? ?No fever or signs of worsening infection but wound does not appear to be healing like intended and concerned with induration and tenderness present. Will I and D today. I did send off contents of I and D to lab to be reviewed. Leave packing in and recheck in 1 week. Will go ahead and switch to  clindamycin from doxycycline. Sensitivities are not back yet from culture 3 days ago. Encouraged warm compresses.  ? ?

## 2021-10-08 NOTE — Patient Instructions (Signed)
Incision and Drainage, Care After ?This sheet gives you information about how to care for yourself after your procedure. Your health care provider may also give you more specific instructions. If you have problems or questions, contact your health care provider. ?What can I expect after the procedure? ?After the procedure, it is common to have: ?Pain or discomfort around the incision site. ?Blood, fluid, or pus (drainage) from the incision. ?Redness and firm skin around the incision site. ?Follow these instructions at home: ?Medicines ?Take over-the-counter and prescription medicines only as told by your health care provider. ?If you were prescribed an antibiotic medicine, use or take it as told by your health care provider. Do not stop using the antibiotic even if you start to feel better. ?Wound care ?Follow instructions from your health care provider about how to take care of your wound. Make sure you: ?Wash your hands with soap and water before and after you change your bandage (dressing). If soap and water are not available, use hand sanitizer. ?Change your dressing and packing as told by your health care provider. ?If your dressing is dry or stuck when you try to remove it, moisten or wet the dressing with saline or water so that it can be removed without harming your skin or tissues. ?If your wound is packed, leave it in place until your health care provider tells you to remove it. To remove the packing, moisten or wet the packing with saline or water so that it can be removed without harming your skin or tissues. ?Leave stitches (sutures), skin glue, or adhesive strips in place. These skin closures may need to stay in place for 2 weeks or longer. If adhesive strip edges start to loosen and curl up, you may trim the loose edges. Do not remove adhesive strips completely unless your health care provider tells you to do that. ?Check your wound every day for signs of infection. Check for: ?More redness, swelling,  or pain. ?More fluid or blood. ?Warmth. ?Pus or a bad smell. ?If you were sent home with a drain tube in place, follow instructions from your health care provider about: ?How to empty it. ?How to care for it at home. ? ?General instructions ?Rest the affected area. ?Do not take baths, swim, or use a hot tub until your health care provider approves. Ask your health care provider if you may take showers. You may only be allowed to take sponge baths. ?Return to your normal activities as told by your health care provider. Ask your health care provider what activities are safe for you. Your health care provider may put you on activity or lifting restrictions. ?The incision will continue to drain. It is normal to have some clear or slightly bloody drainage. The amount of drainage should lessen each day. ?Do not apply any creams, ointments, or liquids unless you have been told to by your health care provider. ?Keep all follow-up visits as told by your health care provider. This is important. ?Contact a health care provider if: ?Your cyst or abscess returns. ?You have a fever or chills. ?You have more redness, swelling, or pain around your incision. ?You have more fluid or blood coming from your incision. ?Your incision feels warm to the touch. ?You have pus or a bad smell coming from your incision. ?You have red streaks above or below the incision site. ?Get help right away if: ?You have severe pain or bleeding. ?You cannot eat or drink without vomiting. ?You have decreased urine output. ?  You become short of breath. ?You have chest pain. ?You cough up blood. ?The affected area becomes numb or starts to tingle. ?These symptoms may represent a serious problem that is an emergency. Do not wait to see if the symptoms will go away. Get medical help right away. Call your local emergency services (911 in the [place] Do not drive yourself to the hospital. ?Summary ?After this procedure, it is common to have fluid, blood, or pus  coming from the surgery site. ?Follow all home care instructions. You will be told how to take care of your incision, how to check for infection, and how to take medicines. ?If you were prescribed an antibiotic medicine, take it as told by your health care provider. Do not stop taking the antibiotic even if you start to feel better. ?Contact a health care provider if you have increased redness, swelling, or pain around your incision. Get help right away if you have chest pain, you vomit, you cough up blood, or you have shortness of breath. ?Keep all follow-up visits as told by your health care provider. This is important. ?This information is not intended to replace advice given to you by your health care provider. Make sure you discuss any questions you have with your health care provider. ?Document Revised: 06/11/2018 Document Reviewed: 06/11/2018 ?Elsevier Patient Education ? [place]. ? ?

## 2021-10-11 ENCOUNTER — Encounter: Payer: Self-pay | Admitting: Physician Assistant

## 2021-10-12 NOTE — Progress Notes (Signed)
Abscess detected from surgical pathology. No abnormal cells.

## 2021-10-13 LAB — ANAEROBIC AND AEROBIC CULTURE
MICRO NUMBER:: 13138051
MICRO NUMBER:: 13138052
SPECIMEN QUALITY:: ADEQUATE
SPECIMEN QUALITY:: ADEQUATE

## 2021-10-15 ENCOUNTER — Encounter: Payer: Self-pay | Admitting: Physician Assistant

## 2021-10-15 ENCOUNTER — Ambulatory Visit: Payer: Medicare PPO | Admitting: Physician Assistant

## 2021-10-15 ENCOUNTER — Other Ambulatory Visit: Payer: Self-pay

## 2021-10-15 VITALS — BP 135/62 | HR 79 | Ht 61.0 in | Wt 187.0 lb

## 2021-10-15 DIAGNOSIS — N611 Abscess of the breast and nipple: Secondary | ICD-10-CM

## 2021-10-15 NOTE — Patient Instructions (Signed)
Keep covered and packed.  ?

## 2021-10-15 NOTE — Progress Notes (Signed)
? ?  Subjective:  ? ? Patient ID: Janice Dudley, female    DOB: 07-19-1951, 71 y.o.   MRN: [id number] ? ?HPI ?Pt presents to the clinic to follow up on breast abscess after I and D. She is doing ok. On last day of clindamycin. No fever, chills, body aches. Still some minor discomfort. Continues to drain pus.  ? ? ?.. ?Active Ambulatory Problems  ?  Diagnosis Date Noted  ? Hyperlipidemia   ? Fibrocystic breast 03/25/2014  ? Hypertriglyceridemia 04/15/2014  ? Obesity 04/15/2014  ? Abnormal weight gain 05/16/2014  ? Atypical pigmented skin lesion 03/02/2016  ? Post-menopausal 03/02/2016  ? No energy 06/22/2017  ? Trouble in sleeping 06/22/2017  ? Strain of abdominal muscle 09/28/2017  ? H/O osteopenia 11/29/2017  ? Left breast mass 01/14/2019  ? Hypotension 01/14/2019  ? Laceration of right lower extremity 01/14/2019  ? Mixed hyperlipidemia 05/26/2020  ? Perceived hearing changes 05/26/2020  ? Primary osteoarthritis of both knees 05/27/2020  ? Elevated liver enzymes 12/28/2020  ? Malignant neoplasm of lower-outer quadrant of right breast of female, estrogen receptor positive ([place]) 04/23/2021  ? Family history of breast cancer 04/29/2021  ? Family history of uterine cancer 04/29/2021  ? Family history of prostate cancer 04/29/2021  ? Genetic testing 05/10/2021  ? Lower extremity edema 07/02/2021  ? Seborrheic dermatitis 07/02/2021  ? Malignant tumor of breast ([place]) 04/26/2021  ? ?Resolved Ambulatory Problems  ?  Diagnosis Date Noted  ? Sprain of ankle 05/16/2014  ? Obesity (BMI 30.0-34.9) 06/22/2017  ? ?Past Medical History:  ?Diagnosis Date  ? Allergy 1960s, 1970s, 2000  ? Arthritis   ? Breast cancer ([place])   ? Cataract   ? ? ?Review of Systems ? ?   ?Objective:  ? Physical Exam ? ? ?Right lower inner breast 2cm by 1cm by .75cm  ? ?Cleaned with hibiclens and repacked with iodoform.  ? ?   ?Assessment & Plan:  ?..Janice Dudley was seen today for abscess. ? ?Diagnoses and all orders for this visit: ? ?Breast  abscess ? ? ?Last day of clindamycin ?Packing pulled out ?Cleaned and repacked ?Follow up in 3 days ? ?

## 2021-10-18 ENCOUNTER — Ambulatory Visit (INDEPENDENT_AMBULATORY_CARE_PROVIDER_SITE_OTHER): Payer: Medicare PPO | Admitting: Physician Assistant

## 2021-10-18 ENCOUNTER — Encounter: Payer: Self-pay | Admitting: Physician Assistant

## 2021-10-18 ENCOUNTER — Other Ambulatory Visit: Payer: Self-pay

## 2021-10-18 VITALS — BP 130/62 | HR 80 | Ht 61.0 in

## 2021-10-18 DIAGNOSIS — N611 Abscess of the breast and nipple: Secondary | ICD-10-CM

## 2021-10-18 NOTE — Patient Instructions (Signed)
3 days pull out packing and repack at home ?Recheck in 1 week ?

## 2021-10-18 NOTE — Progress Notes (Signed)
? ?  Subjective:  ? ? Patient ID: Janice Dudley, female    DOB: February 25, 1951, 71 y.o.   MRN: [id number] ? ?HPI ?Pt is a 71 yo female with right breast abscess follow up.  ? ? ?3/24 2cm by 1cm by .75cm ? ?3/27 1.75 by .75 by .5cm ? ?It is improving. No fever, chills, body aches. Continues to drain some daily and keeps covered. Pain over area is not as bad. Finished clindamycin.  ?.. ?Active Ambulatory Problems  ?  Diagnosis Date Noted  ? Hyperlipidemia   ? Fibrocystic breast 03/25/2014  ? Hypertriglyceridemia 04/15/2014  ? Obesity 04/15/2014  ? Abnormal weight gain 05/16/2014  ? Atypical pigmented skin lesion 03/02/2016  ? Post-menopausal 03/02/2016  ? No energy 06/22/2017  ? Trouble in sleeping 06/22/2017  ? Strain of abdominal muscle 09/28/2017  ? H/O osteopenia 11/29/2017  ? Left breast mass 01/14/2019  ? Hypotension 01/14/2019  ? Laceration of right lower extremity 01/14/2019  ? Mixed hyperlipidemia 05/26/2020  ? Perceived hearing changes 05/26/2020  ? Primary osteoarthritis of both knees 05/27/2020  ? Elevated liver enzymes 12/28/2020  ? Malignant neoplasm of lower-outer quadrant of right breast of female, estrogen receptor positive ([place]) 04/23/2021  ? Family history of breast cancer 04/29/2021  ? Family history of uterine cancer 04/29/2021  ? Family history of prostate cancer 04/29/2021  ? Genetic testing 05/10/2021  ? Lower extremity edema 07/02/2021  ? Seborrheic dermatitis 07/02/2021  ? Malignant tumor of breast ([place]) 04/26/2021  ? ?Resolved Ambulatory Problems  ?  Diagnosis Date Noted  ? Sprain of ankle 05/16/2014  ? Obesity (BMI 30.0-34.9) 06/22/2017  ? ?Past Medical History:  ?Diagnosis Date  ? Allergy 1960s, 1970s, 2000  ? Arthritis   ? Breast cancer ([place])   ? Cataract   ? ? ? ?Review of Systems ?See HPI.  ?   ?Objective:  ? Physical Exam ? ? ?3/27 1.75cm by .75cm by .5cm ?No active drainage but some purulent drainage seen in bandage and around iodoform dressing when pulled out.  ?No surrounding  erythema or warmth ? ?   ?Assessment & Plan:  ?..Janice Dudley was seen today for follow-up. ? ?Diagnoses and all orders for this visit: ? ?Breast abscess ? ? ?Decreasing in size with no redness around area or warmth. Some purulent drainage. ?Repacked today ?Instructed to take out in 3 days and repack at home ?Follow up in 1 week  ?

## 2021-10-19 ENCOUNTER — Inpatient Hospital Stay: Payer: Medicare PPO | Attending: Hematology and Oncology | Admitting: Hematology and Oncology

## 2021-10-19 NOTE — Progress Notes (Deleted)
?[place]  ?Telephone:(336) [phone] [phone]  ? ? ?ID: Janice Dudley DOB: Feb 25, 1951  MR#: [id number]  MGQ#:[id number] ? ?Patient Care Team: ?Lavada Mesi as PCP - General (Family Medicine) ?Mauro Kaufmann, RN as Oncology Nurse Navigator ?Rockwell Germany, RN as Oncology Nurse Navigator ?Coralie Keens, MD as Consulting Physician (General Surgery) ?Magrinat, [name], MD (Inactive) as Consulting Physician (Oncology) ?[name], MD as Consulting Physician (Radiation Oncology) ?[name], MD as Consulting Physician (Obstetrics and Gynecology) ?Silverio Decamp, MD as Consulting Physician (Sports Medicine) ?[name], MD as Referring Physician (Specialist) ?[name], MD ?OTHER MD: ? ?CHIEF COMPLAINT: Estrogen receptor positive breast cancer ? ?CURRENT TREATMENT: Anastrozole; adjuvant radiation pending ? ? ?INTERVAL HISTORY: ?Janice Dudley returns today for follow up of her estrogen receptor positive breast cancer. She was evaluated in the multidisciplinary breast cancer clinic on 04/28/2021.  She is accompanied by her husband ? ?She underwent genetic testing during clinic. Results were negative. ? ?She proceeded to right lumpectomy on 05/31/2021 under Dr. Ninfa Linden. Pathology from the procedure [id number]) showed: invasive ductal carcinoma, 0.5 cm, grade 2; margins negative for carcinoma. ? ?All three lymph nodes were negative for metastatic carcinoma. ? ?She is already scheduled to meet with Dr. Lisbeth Renshaw but is concerned that they have a month-long trip to [place] planned in January and radiation may not actually start until February. ? ? ?REVIEW OF SYSTEMS: ?Janice Dudley did very well with her surgery, with no significant pain bleeding fever or other complications.  She has had an itchy scalp and a visible rash at the base of her neck since the day after surgery.  She does not have a rash elsewhere.  A detailed review of systems today was otherwise  benign ? ? COVID 19 VACCINATION STATUS: Pfizer x3 as of October 2022 ? ? ?HISTORY OF CURRENT ILLNESS: ?From the original intake note: ? ?Janice Dudley "Janice Dudley" was under [employer] follow up for a likely-benign left breast mass. She underwent bilateral diagnostic mammography with tomography and bilateral breast ultrasonography at [facility] on 04/12/2021 showing: breast density category B; new indeterminate 7 mm mass in right breast at 7 o'clock; no evidence of right axillary adenopathy; the likely-benign left breast mass at 4 o'clock is stable. ? ?Accordingly on 04/19/2021 she proceeded to biopsy of the right breast area in question. The pathology from this procedure ([id number]) showed: invasive ductal carcinoma, grade 2/3. Prognostic indicators significant for: estrogen receptor, >95% positive with strong staining intensity and progesterone receptor, 40% positive with moderate staining intensity. Proliferation marker Ki67 at 15%. HER2 equivocal by immunohistochemistry (2+), but negative by fluorescent in situ hybridization with a signals ratio 2.02 and 1.89 and number per cell 4.55 and 4.15}. ? ?She also underwent biopsy of the left breast mass on 04/27/2021. Pathology ([id number]) was benign, showing fibrocystic changes including apocrine metaplasia. ? ? Cancer Staging  ?Malignant neoplasm of lower-outer quadrant of right breast of female, estrogen receptor positive ([place]) ?Staging form: Breast, AJCC 8th Edition ?- Clinical stage from 04/28/2021: Stage IA (cT1b, cN0, cM0, G3, ER+, PR+, HER2-) - Signed by Chauncey Cruel, MD on 04/28/2021 ?Stage prefix: Initial diagnosis ?Histologic grading system: 3 grade system ?- Pathologic stage from 06/29/2021: Stage IA (pT1a, pN0(sn), cM0, G2, ER+, PR+, HER2-) - Unsigned ?Stage prefix: Initial diagnosis ?Method of lymph node assessment: Sentinel lymph node biopsy ?Multigene prognostic tests performed: None ?Histologic grading system: 3 grade  system ? ? ?The patient's subsequent history is as detailed below. ? ? ?  PAST MEDICAL HISTORY: ?Past Medical History:  ?Diagnosis Date  ? Allergy 1960s, 1970s, 2000  ? Arthritis   ? Breast cancer ([place])   ? Cataract   ? no surgery yet  ? Hyperlipidemia   ? ? ?PAST SURGICAL HISTORY: ?Past Surgical History:  ?Procedure Laterality Date  ? BREAST EXCISIONAL BIOPSY Left 1999  ? BREAST LUMPECTOMY WITH RADIOACTIVE SEED AND SENTINEL LYMPH NODE BIOPSY Right 05/31/2021  ? Procedure: RIGHT BREAST LUMPECTOMY WITH RADIOACTIVE SEED AND SENTINEL LYMPH NODE BIOPSY;  Surgeon: Coralie Keens, MD;  Location: [facility];  Service: General;  Laterality: Right;  ? [place]  ? Lumpectomy  ? CESAREAN SECTION  1982/1983  ? 2 times  ? COLONOSCOPY  2008  ? EYE SURGERY  01/2021  ? Vitrectomy  ? LAPAROSCOPY    ? abd/tubal ligation  ? TUBAL LIGATION  1988  ? VITRECTOMY    ? ? ?FAMILY HISTORY: ?Family History  ?Problem Relation Age of Onset  ? Stroke Mother   ? Heart attack Mother 6  ? Cancer Mother   ?     Uterine  ? Breast cancer Mother 75  ? Heart attack Father 33  ? Cancer Father   ?     Lung  ? Stroke Father   ? Hyperlipidemia Sister   ? Breast cancer Sister 33  ? Cancer Sister   ? Hyperlipidemia Sister   ? Thyroid nodules Brother   ? Hyperlipidemia Brother   ? Prostate cancer Brother 14  ? Cancer Brother   ? Hyperlipidemia Brother   ? Lung cancer Brother 50  ? Thyroid cancer Brother   ?     dx. early 39s  ? Cancer Brother   ? Hyperlipidemia Brother   ? Anxiety disorder Daughter   ? Depression Daughter   ? Colon cancer Neg Hx   ? Esophageal cancer Neg Hx   ? Rectal cancer Neg Hx   ? Her father died at age 33 from [facility]. He had a history of lung cancer at age 50. Her mother died at age 87 from uterine cancer. She had a history of breast cancer at age 44 and apparently was treated with tamoxifen. Janice Dudley has three brothers and three sisters. One sister had breast and uterine cancer at age 63 (possibly also treated with  tamoxifen), a brother had lung cancer at age 66, and another brother had prostate cancer at age 18.  There is no history of colon cancer in the family ? ? ?GYNECOLOGIC HISTORY:  ?No LMP recorded. Patient is postmenopausal. ?Menarche: 71 years old ?Age at first live birth: 71 years old ?GX P 2 ?LMP 2006 ?Contraceptive: used for 12 years ?HRT: never used  ?Hysterectomy? no ?BSO? no ? ? 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 Husband [name] is a retired Government social research officer. Daughter [name], age 64, is a Secretary/administrator in [place], [place]. Son [name], age 13, is an MRI tech in [place]. Janice Dudley has three grandchildren. She attends a [employer] in [place]. ?  ? ADVANCED DIRECTIVES: in place ? ? ?HEALTH MAINTENANCE: ?Social History  ? ?Tobacco Use  ? Smoking status: Former  ?  Packs/day: 0.25  ?  Years: 20.00  ?  Pack years: 5.00  ?  Types: Cigarettes  ?  Quit date: 05/09/1989  ?  Years since quitting: 32.4  ? Smokeless tobacco: Never  ?Vaping Use  ? Vaping Use: Never used  ?  Substance Use Topics  ? Alcohol use: Yes  ?  Alcohol/week: 1.0 - 2.0 standard drink  ?  Types: 1 - 2 Glasses of wine per week  ?  Comment: wine   ? Drug use: No  ? ? ? Colonoscopy: 04/2018 (Dr. Ardis Hughs), recall 2029 ? PAP: 2021 ? Bone density: 2021 ?  ?Allergies  ?Allergen Reactions  ? Peach [Prunus Persica]   ?  Hives/itching  ? Sulfa Antibiotics Hives  ? Topamax [Topiramate]   ?  Bilateral leg pain  ? Shellfish Allergy Hives  ? ? ?Current Outpatient Medications  ?Medication Sig Dispense Refill  ? albuterol (VENTOLIN HFA) 108 (90 Base) MCG/ACT inhaler INHALE 2 PUFFS INTO THE LUNGS EVERY 6 HOURS AS NEEDED FOR WHEEZING OR SHORTNESS OF BREATH 25.5 g 0  ? anastrozole (ARIMIDEX) 1 MG tablet Take 1 tablet (1 mg total) by mouth daily. 90 tablet 4  ? aspirin 81 MG tablet Take 81 mg by mouth daily.    ? atorvastatin (LIPITOR) 40 MG tablet Take 1 tablet (40 mg total) by mouth  daily. 90 tablet 3  ? b complex vitamins tablet Take 1 tablet by mouth daily.    ? buPROPion (WELLBUTRIN SR) 150 MG 12 hr tablet Take 1 tablet (150 mg total) by mouth 2 (two) times daily. 180 tablet 3  ? calcium car

## 2021-10-20 DIAGNOSIS — H25813 Combined forms of age-related cataract, bilateral: Secondary | ICD-10-CM | POA: Diagnosis not present

## 2021-10-20 DIAGNOSIS — H43821 Vitreomacular adhesion, right eye: Secondary | ICD-10-CM | POA: Diagnosis not present

## 2021-10-26 ENCOUNTER — Ambulatory Visit (INDEPENDENT_AMBULATORY_CARE_PROVIDER_SITE_OTHER): Payer: Medicare PPO | Admitting: Physician Assistant

## 2021-10-26 VITALS — BP 110/54 | HR 95 | Ht 61.0 in | Wt 187.0 lb

## 2021-10-26 DIAGNOSIS — N611 Abscess of the breast and nipple: Secondary | ICD-10-CM

## 2021-10-26 NOTE — Progress Notes (Signed)
? ?  Subjective:  ? ? Patient ID: Janice Dudley, female    DOB: 03/10/1951, 71 y.o.   MRN: [id number] ? ?Wound Check ? ?Janice Dudley is a 71 yo female here for a checkup for a right breast abscess.  ? ?Initial encounter for this breast abscess was on 10/08/21. She has been seen every week for assessment. Patient states the packing came out in the shower. She reports to still have some swelling of the right breast but no pain.  ? ? ?Review of Systems ?All other pertinent ROS were reviewed and found to be negative.  ?   ?Objective:  ? Physical Exam ?Skin: ?   Comments: Today the wound was 0.9 cm x 0.7 cm x 0.3 cm. Yellow granulation tissue was seen inside the wound. Slight erythema surrounding the border of the abscess. No warmth.   ? ? ?   ?Assessment & Plan:  ?..Laverta was seen today for wound check. ? ?Diagnoses and all orders for this visit: ? ?Breast abscess ? ? ? ?Patient's breast abscess seems to have improved from last visit. It has gone down in size and granulation tissue is noted in the wound. Continue to keep the wound clean and dry. Keep covered with duo-derm patch(change every 3 days) and send MyChart message for updates on abscess. Return to clinic if abscess worsens or shows no improvement in 7 days. Watch for signs of infection (increasing pain, redness, warmth, or a fever) and reach out if these signs do appear.  ? ?

## 2021-10-27 ENCOUNTER — Encounter: Payer: Self-pay | Admitting: Physician Assistant

## 2021-10-29 ENCOUNTER — Inpatient Hospital Stay: Payer: Medicare PPO | Admitting: Hematology and Oncology

## 2021-11-11 ENCOUNTER — Other Ambulatory Visit: Payer: Self-pay

## 2021-11-11 ENCOUNTER — Inpatient Hospital Stay: Payer: Medicare PPO | Attending: Hematology and Oncology | Admitting: Hematology and Oncology

## 2021-11-11 ENCOUNTER — Encounter: Payer: Self-pay | Admitting: Hematology and Oncology

## 2021-11-11 VITALS — BP 143/85 | HR 73 | Temp 97.9°F | Resp 16 | Wt 185.1 lb

## 2021-11-11 DIAGNOSIS — Z170 Estrogen receptor positive status [ER+]: Secondary | ICD-10-CM | POA: Diagnosis not present

## 2021-11-11 DIAGNOSIS — Z79899 Other long term (current) drug therapy: Secondary | ICD-10-CM | POA: Diagnosis not present

## 2021-11-11 DIAGNOSIS — Z923 Personal history of irradiation: Secondary | ICD-10-CM | POA: Diagnosis not present

## 2021-11-11 DIAGNOSIS — Z79811 Long term (current) use of aromatase inhibitors: Secondary | ICD-10-CM | POA: Insufficient documentation

## 2021-11-11 DIAGNOSIS — Z87891 Personal history of nicotine dependence: Secondary | ICD-10-CM | POA: Diagnosis not present

## 2021-11-11 DIAGNOSIS — C50511 Malignant neoplasm of lower-outer quadrant of right female breast: Secondary | ICD-10-CM | POA: Diagnosis not present

## 2021-11-11 DIAGNOSIS — R5383 Other fatigue: Secondary | ICD-10-CM

## 2021-11-11 DIAGNOSIS — Z7982 Long term (current) use of aspirin: Secondary | ICD-10-CM | POA: Diagnosis not present

## 2021-11-11 NOTE — Progress Notes (Signed)
?[place]  ?Telephone:(336) [phone] [phone]  ? ? ?ID: Janice Dudley DOB: 05/10/1951  MR#: [id number]  MIW#:[id number] ? ?Patient Care Team: ?Lavada Mesi as PCP - General (Family Medicine) ?Mauro Kaufmann, RN as Oncology Nurse Navigator ?Rockwell Germany, RN as Oncology Nurse Navigator ?Coralie Keens, MD as Consulting Physician (General Surgery) ?Magrinat, [name], MD (Inactive) as Consulting Physician (Oncology) ?[name], MD as Consulting Physician (Radiation Oncology) ?[name], MD as Consulting Physician (Obstetrics and Gynecology) ?Silverio Decamp, MD as Consulting Physician (Sports Medicine) ?[name], MD as Referring Physician (Specialist) ?[name], MD ?OTHER MD: ? ?CHIEF COMPLAINT: Estrogen receptor positive breast cancer ? ?CURRENT TREATMENT: Anastrazole. ? ?INTERVAL HISTORY: ?Janice Dudley is here for follow-up with her husband.  She tells me that radiation went well except for some fatigue.  She also had some abscess in the right breast after radiation and this was drained and treated.  She tells me that there was a cyst was noticed during the drainage and she was wondering how this could have formed since she just had radiation.  She overall tolerated anastrozole very well.  She tells me that she gets tired easily but otherwise denies any new health complaints.  Rest of the pertinent 10 point ROS reviewed and negative ? ?HISTORY OF CURRENT ILLNESS: ? ?Oncology History  ?Malignant neoplasm of lower-outer quadrant of right breast of female, estrogen receptor positive ([place])  ?04/12/2021 Mammogram  ?  She underwent bilateral diagnostic mammography with tomography and bilateral breast ultrasonography at [facility] on 04/12/2021 showing: breast density category B; new indeterminate 7 mm mass in right breast at 7 o'clock; no evidence of right axillary adenopathy; the likely-benign left breast mass at 4 o'clock is  stable. ?  ?04/15/2021 Pathology Results  ?  The pathology from this procedure ([id number]) showed: invasive ductal carcinoma, grade 2/3. Prognostic indicators significant for: estrogen receptor, >95% positive with strong staining intensity and progesterone receptor, 40% positive with moderate staining intensity. Proliferation marker Ki67 at 15%. HER2 equivocal by immunohistochemistry (2+), but negative by fluorescent in situ hybridization with a signals ratio 2.02 and 1.89 and number per cell 4.55 and 4.15}. ? ?She also underwent biopsy of the left breast mass on 04/27/2021. Pathology ([id number]) was benign, showing fibrocystic changes including apocrine metaplasia. ?  ?04/23/2021 Initial Diagnosis  ? Malignant neoplasm of lower-outer quadrant of right breast of female, estrogen receptor positive ([place]) ? ?  ?04/28/2021 Cancer Staging  ? Staging form: Breast, AJCC 8th Edition ?- Clinical stage from 04/28/2021: Stage IA (cT1b, cN0, cM0, G3, ER+, PR+, HER2-) - Signed by Chauncey Cruel, MD on 04/28/2021 ?Stage prefix: Initial diagnosis ?Histologic grading system: 3 grade system ? ?  ?05/05/2021 Genetic Testing  ? [facility] CustomNext Panel was negative. No pathogenic variants were identified in the 47 genes analyzed. Report date is 05/10/2021.  ? ?The CustomNext-Cancer+RNAinsight panel offered by [facility] includes sequencing and rearrangement analysis for the following 47 genes:  APC, ATM, AXIN2, BARD1, BMPR1A, BRCA1, BRCA2, BRIP1, CDH1, CDK4, CDKN2A, CHEK2, DICER1, EPCAM, GREM1, HOXB13, MEN1, MLH1, MSH2, MSH3, MSH6, MUTYH, NBN, NF1, NF2, NTHL1, PALB2, PMS2, POLD1, POLE, PTEN, RAD51C, RAD51D, RECQL, RET, SDHA, SDHAF2, SDHB, SDHC, SDHD, SMAD4, SMARCA4, STK11, TP53, TSC1, TSC2, and VHL.  RNA data is routinely analyzed for use in variant interpretation for all genes.  ?  ?05/31/2021 Surgery  ? right lumpectomy with sentinel lymph node sampling 05/31/2021 showed a pT1a pN0, stage IA invasive ductal carcinoma, grade  2, with  negative margins ?(a) a total of 3 right axillary lymph nodes were removed. ?  ? - 08/05/2021 Radiation Therapy  ? Completed adjuvant radiation ?  ? ? ? ?PAST MEDICAL HISTORY: ?Past Medical History:  ?Diagnosis Date  ? Allergy 1960s, 1970s, 2000  ? Arthritis   ? Breast cancer ([place])   ? Cataract   ? no surgery yet  ? Hyperlipidemia   ? ? ?PAST SURGICAL HISTORY: ?Past Surgical History:  ?Procedure Laterality Date  ? BREAST EXCISIONAL BIOPSY Left 1999  ? BREAST LUMPECTOMY WITH RADIOACTIVE SEED AND SENTINEL LYMPH NODE BIOPSY Right 05/31/2021  ? Procedure: RIGHT BREAST LUMPECTOMY WITH RADIOACTIVE SEED AND SENTINEL LYMPH NODE BIOPSY;  Surgeon: Coralie Keens, MD;  Location: [facility];  Service: General;  Laterality: Right;  ? [place]  ? Lumpectomy  ? CESAREAN SECTION  1982/1983  ? 2 times  ? COLONOSCOPY  2008  ? EYE SURGERY  01/2021  ? Vitrectomy  ? LAPAROSCOPY    ? abd/tubal ligation  ? TUBAL LIGATION  1988  ? VITRECTOMY    ? ? ?FAMILY HISTORY: ?Family History  ?Problem Relation Age of Onset  ? Stroke Mother   ? Heart attack Mother 48  ? Cancer Mother   ?     Uterine  ? Breast cancer Mother 85  ? Heart attack Father 2  ? Cancer Father   ?     Lung  ? Stroke Father   ? Hyperlipidemia Sister   ? Breast cancer Sister 60  ? Cancer Sister   ? Hyperlipidemia Sister   ? Thyroid nodules Brother   ? Hyperlipidemia Brother   ? Prostate cancer Brother 4  ? Cancer Brother   ? Hyperlipidemia Brother   ? Lung cancer Brother 91  ? Thyroid cancer Brother   ?     dx. early 93s  ? Cancer Brother   ? Hyperlipidemia Brother   ? Anxiety disorder Daughter   ? Depression Daughter   ? Colon cancer Neg Hx   ? Esophageal cancer Neg Hx   ? Rectal cancer Neg Hx   ? Her father died at age 70 from [facility]. He had a history of lung cancer at age 59. Her mother died at age 67 from uterine cancer. She had a history of breast cancer at age 58 and apparently was treated with tamoxifen. Janice Dudley has three brothers and three  sisters. One sister had breast and uterine cancer at age 67 (possibly also treated with tamoxifen), a brother had lung cancer at age 2, and another brother had prostate cancer at age 96.  There is no history of colon cancer in the family ? ? ?GYNECOLOGIC HISTORY:  ?No LMP recorded. Patient is postmenopausal. ?Menarche: 71 years old ?Age at first live birth: 71 years old ?GX P 2 ?LMP 2006 ?Contraceptive: used for 12 years ?HRT: never used  ?Hysterectomy? no ?BSO? no ? ? 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 Husband Janice Dudley is a retired Government social research officer. Daughter Janice Dudley, age 68, is a Secretary/administrator in [place], [place]. Son Janice Dudley, age 87, is an MRI tech in [place]. Janice Dudley has three grandchildren. She attends a [employer] in [place]. ?  ? ADVANCED DIRECTIVES: in place ? ? ?HEALTH MAINTENANCE: ?Social History  ? ?Tobacco Use  ? Smoking status: Former  ?  Packs/day: 0.25  ?  Years: 20.00  ?  Pack years:  5.00  ?  Types: Cigarettes  ?  Quit date: 05/09/1989  ?  Years since quitting: 32.5  ? Smokeless tobacco: Never  ?Vaping Use  ? Vaping Use: Never used  ?Substance Use Topics  ? Alcohol use: Yes  ?  Alcohol/week: 1.0 - 2.0 standard drink  ?  Types: 1 - 2 Glasses of wine per week  ?  Comment: wine   ? Drug use: No  ? ? ? Colonoscopy: 04/2018 (Dr. Ardis Hughs), recall 2029 ? PAP: 2021 ? Bone density: 2021 ?  ?Allergies  ?Allergen Reactions  ? Peach [Prunus Persica]   ?  Hives/itching  ? Sulfa Antibiotics Hives  ? Topamax [Topiramate]   ?  Bilateral leg pain  ? Shellfish Allergy Hives  ? ? ?Current Outpatient Medications  ?Medication Sig Dispense Refill  ? albuterol (VENTOLIN HFA) 108 (90 Base) MCG/ACT inhaler INHALE 2 PUFFS INTO THE LUNGS EVERY 6 HOURS AS NEEDED FOR WHEEZING OR SHORTNESS OF BREATH 25.5 g 0  ? anastrozole (ARIMIDEX) 1 MG tablet Take 1 tablet (1 mg total) by mouth daily. 90 tablet 4  ? aspirin 81 MG tablet Take 81 mg by  mouth daily.    ? atorvastatin (LIPITOR) 40 MG tablet Take 1 tablet (40 mg total) by mouth daily. 90 tablet 3  ? b complex vitamins tablet Take 1 tablet by mouth daily.    ? buPROPion (WELLBUTRIN SR) 150 MG 12 hr ta

## 2021-11-21 IMAGING — MG MM BREAST SURGICAL SPECIMEN
1 series · 2 of 2 positions shown · non-contrast
Comparison: Previous exam(s).

CLINICAL DATA: Status post lumpectomy today after earlier
radioactive seed localization.

EXAM:
SPECIMEN RADIOGRAPH OF THE RIGHT BREAST

[Series 1: R · right · 0.07mm/px · 2 of 2 slices shown]
[im 1/2]
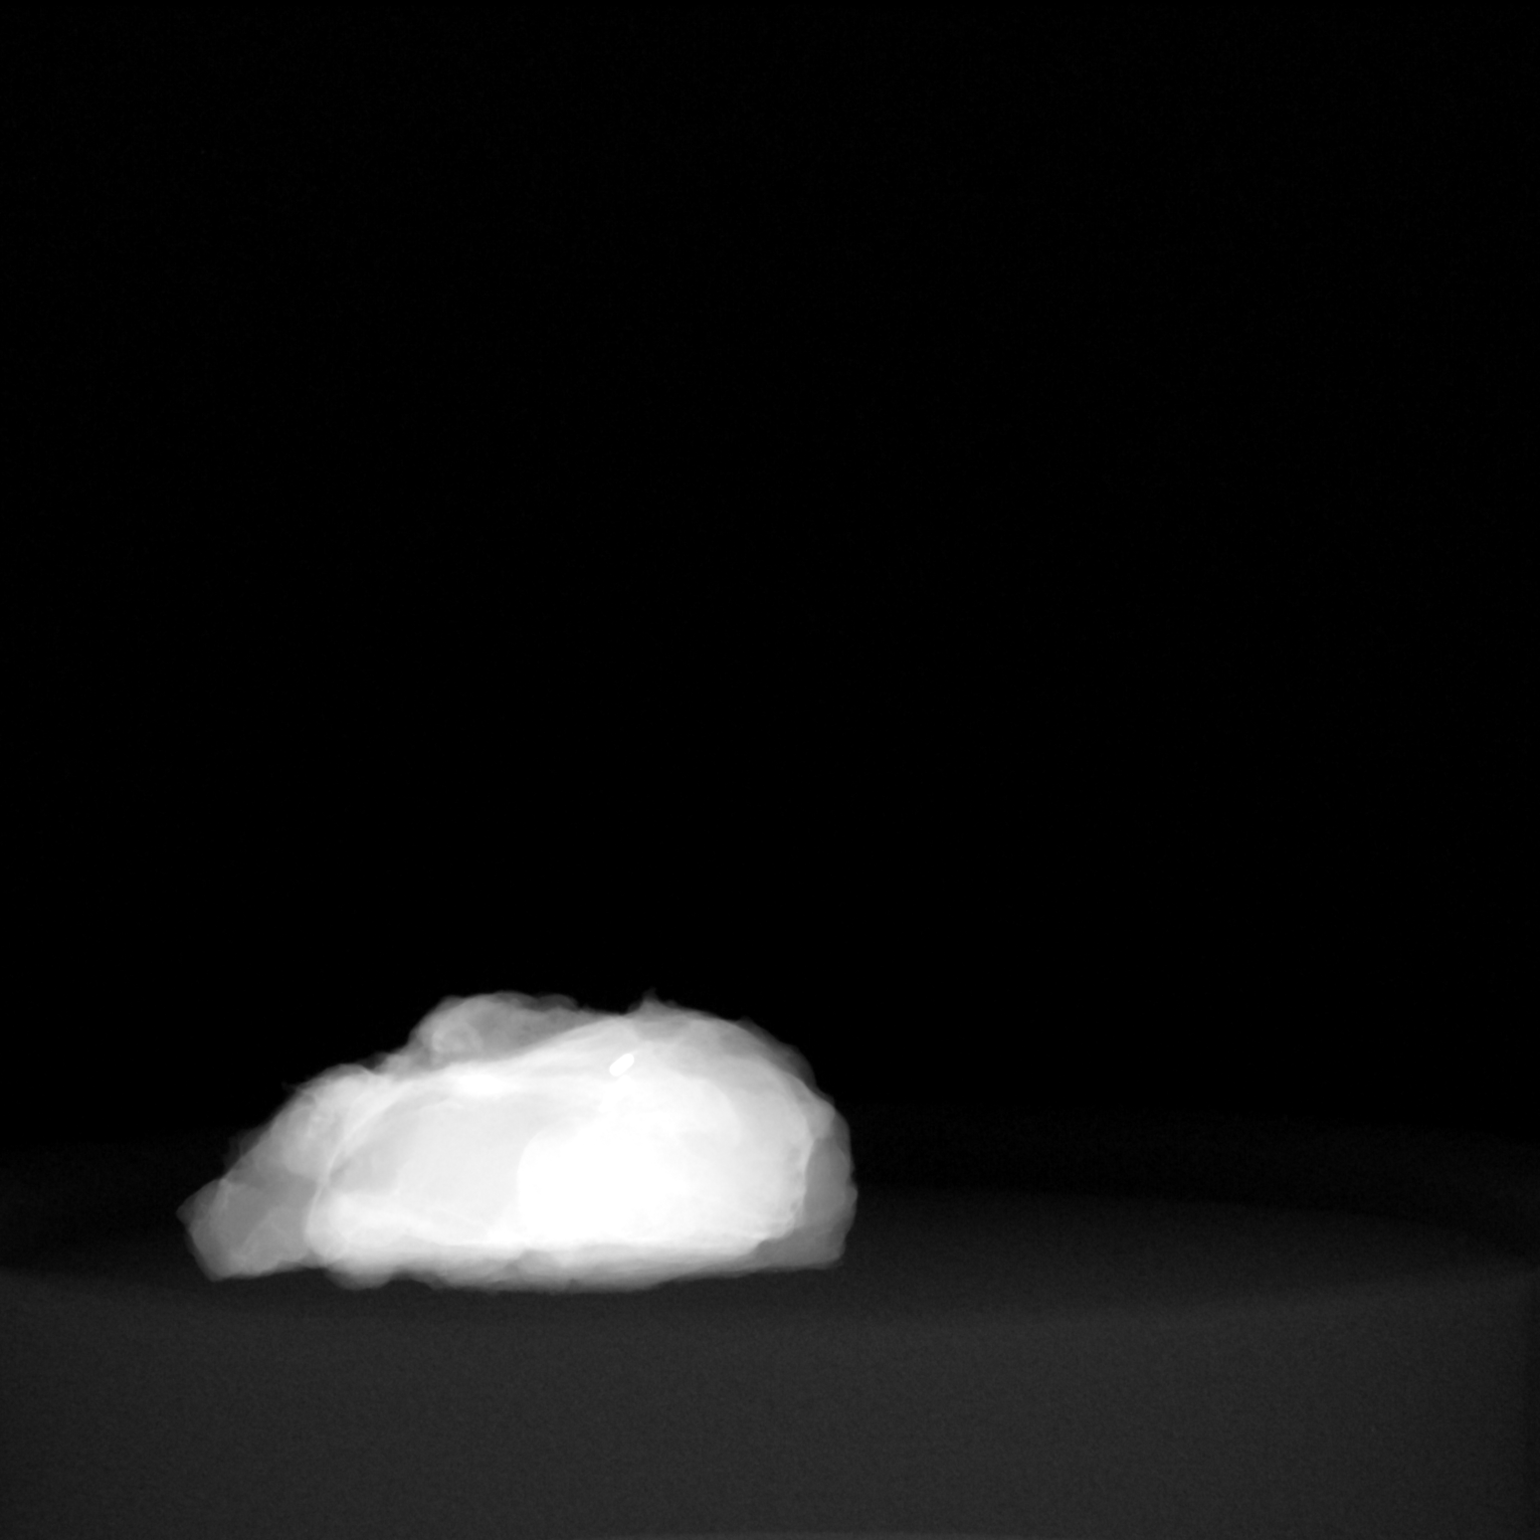
[im 2/2]
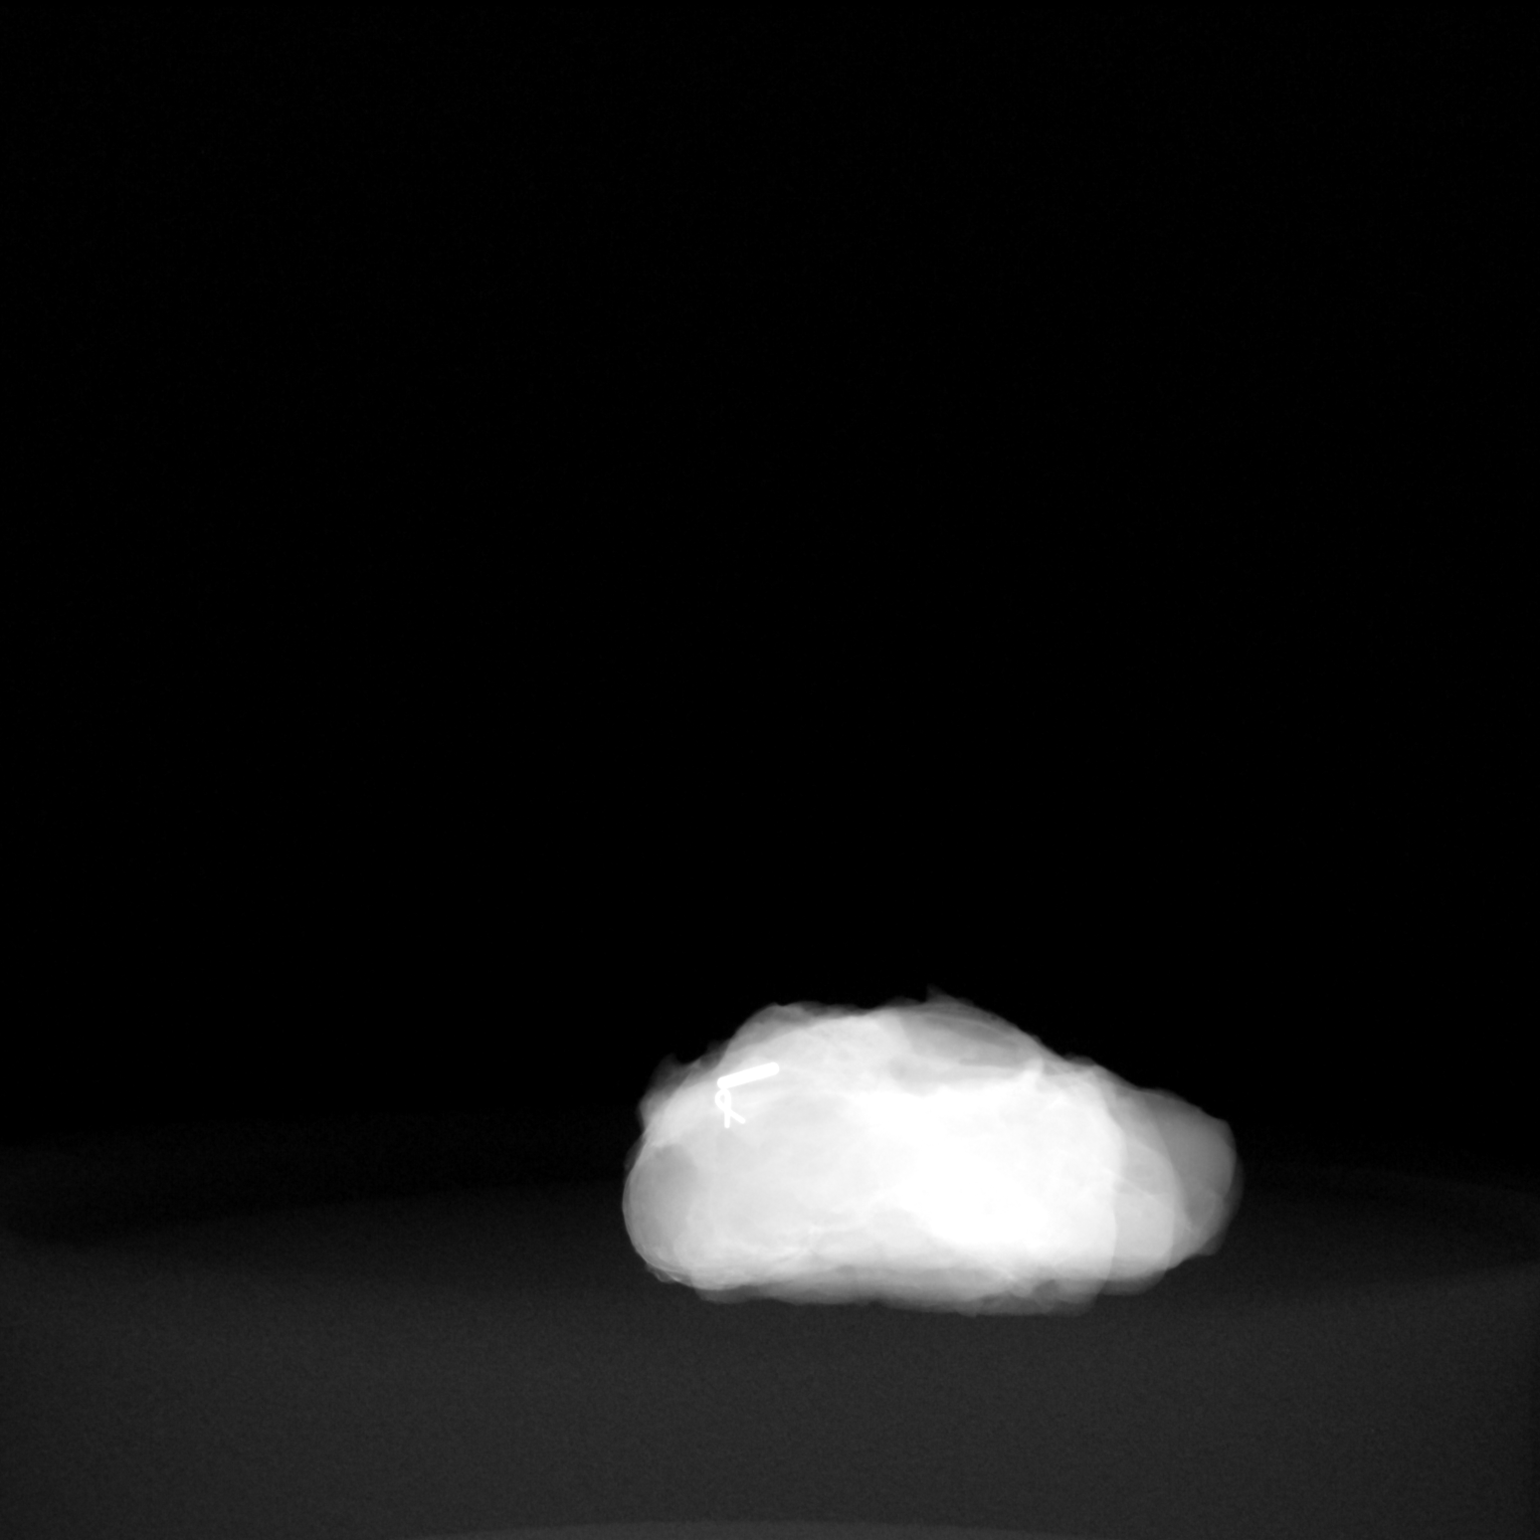

[2 of 2 positions shown; findings below may reference images not displayed]

FINDINGS: Status post excision of the right breast. The radioactive seed and
biopsy marker clip are present and completely intact within the
periphery of the specimen. Findings discussed with the OR staff
during the procedure.
IMPRESSION: Specimen radiograph of the right breast.

## 2021-12-27 DIAGNOSIS — H25013 Cortical age-related cataract, bilateral: Secondary | ICD-10-CM | POA: Diagnosis not present

## 2021-12-27 DIAGNOSIS — H2512 Age-related nuclear cataract, left eye: Secondary | ICD-10-CM | POA: Diagnosis not present

## 2021-12-27 DIAGNOSIS — H2513 Age-related nuclear cataract, bilateral: Secondary | ICD-10-CM | POA: Diagnosis not present

## 2021-12-27 DIAGNOSIS — H18413 Arcus senilis, bilateral: Secondary | ICD-10-CM | POA: Diagnosis not present

## 2021-12-27 DIAGNOSIS — H35372 Puckering of macula, left eye: Secondary | ICD-10-CM | POA: Diagnosis not present

## 2022-01-17 ENCOUNTER — Ambulatory Visit: Payer: Medicare PPO | Attending: Surgery

## 2022-01-17 VITALS — Wt 185.1 lb

## 2022-01-17 DIAGNOSIS — N958 Other specified menopausal and perimenopausal disorders: Secondary | ICD-10-CM | POA: Diagnosis not present

## 2022-01-17 DIAGNOSIS — Z13820 Encounter for screening for osteoporosis: Secondary | ICD-10-CM | POA: Diagnosis not present

## 2022-01-17 DIAGNOSIS — Z483 Aftercare following surgery for neoplasm: Secondary | ICD-10-CM | POA: Insufficient documentation

## 2022-01-27 ENCOUNTER — Telehealth: Payer: Self-pay | Admitting: Hematology and Oncology

## 2022-01-27 NOTE — Telephone Encounter (Signed)
Rescheduled appointment per providers template. Left message.  ? ?

## 2022-02-01 DIAGNOSIS — H2512 Age-related nuclear cataract, left eye: Secondary | ICD-10-CM | POA: Diagnosis not present

## 2022-02-01 DIAGNOSIS — H2511 Age-related nuclear cataract, right eye: Secondary | ICD-10-CM | POA: Diagnosis not present

## 2022-02-01 DIAGNOSIS — H269 Unspecified cataract: Secondary | ICD-10-CM | POA: Diagnosis not present

## 2022-02-04 DIAGNOSIS — C50911 Malignant neoplasm of unspecified site of right female breast: Secondary | ICD-10-CM | POA: Diagnosis not present

## 2022-02-06 ENCOUNTER — Other Ambulatory Visit: Payer: Self-pay | Admitting: Physician Assistant

## 2022-02-06 DIAGNOSIS — L719 Rosacea, unspecified: Secondary | ICD-10-CM

## 2022-02-06 DIAGNOSIS — E6609 Other obesity due to excess calories: Secondary | ICD-10-CM

## 2022-02-07 ENCOUNTER — Ambulatory Visit: Payer: Medicare PPO | Attending: Surgery

## 2022-02-07 VITALS — Wt 185.0 lb

## 2022-02-07 DIAGNOSIS — Z483 Aftercare following surgery for neoplasm: Secondary | ICD-10-CM | POA: Insufficient documentation

## 2022-02-07 NOTE — Therapy (Addendum)
OUTPATIENT PHYSICAL THERAPY SOZO SCREENING NOTE   Patient Name: Janice Dudley: [id number] DOB:[birth year], 71 y.o., female Today's Date: 02/07/2022  PCP: Lavada Mesi REFERRING PROVIDER: Coralie Keens, MD   PT End of Session - 02/07/22 1457     Visit Number 1   #unchanged due to screen only   PT Start Time 0272    PT Stop Time 1500    PT Time Calculation (min) 6 min    Activity Tolerance Patient tolerated treatment well    Behavior During Therapy WFL for tasks assessed/performed             Past Medical History:  Diagnosis Date   Allergy 1960s, 1970s, 2000   Arthritis    Breast cancer ([place])    Cataract    no surgery yet   Hyperlipidemia    Past Surgical History:  Procedure Laterality Date   BREAST EXCISIONAL BIOPSY Left 1999   BREAST LUMPECTOMY WITH RADIOACTIVE SEED AND SENTINEL LYMPH NODE BIOPSY Right 05/31/2021   Procedure: RIGHT BREAST LUMPECTOMY WITH RADIOACTIVE SEED AND SENTINEL LYMPH NODE BIOPSY;  Surgeon: Coralie Keens, MD;  Location: [facility];  Service: General;  Laterality: Right;   BREAST SURGERY  1999   Lumpectomy   CESAREAN SECTION  1982/1983   2 times   COLONOSCOPY  2008   EYE SURGERY  01/2021   Vitrectomy   LAPAROSCOPY     abd/tubal ligation   TUBAL LIGATION  1988   VITRECTOMY     Patient Active Problem List   Diagnosis Date Noted   Lower extremity edema 07/02/2021   Seborrheic dermatitis 07/02/2021   Genetic testing 05/10/2021   Family history of breast cancer 04/29/2021   Family history of uterine cancer 04/29/2021   Family history of prostate cancer 04/29/2021   Malignant tumor of breast ([place]) 04/26/2021   Malignant neoplasm of lower-outer quadrant of right breast of female, estrogen receptor positive ([place]) 04/23/2021   Elevated liver enzymes 12/28/2020   Primary osteoarthritis of both knees 05/27/2020   Mixed hyperlipidemia 05/26/2020   Perceived hearing changes 05/26/2020   Left breast  mass 01/14/2019   Hypotension 01/14/2019   Laceration of right lower extremity 01/14/2019   H/O osteopenia 11/29/2017   Strain of abdominal muscle 09/28/2017   No energy 06/22/2017   Trouble in sleeping 06/22/2017   Atypical pigmented skin lesion 03/02/2016   Post-menopausal 03/02/2016   Abnormal weight gain 05/16/2014   Hypertriglyceridemia 04/15/2014   Obesity 04/15/2014   Fibrocystic breast 03/25/2014   Hyperlipidemia     REFERRING DIAG: right breast cancer at risk for lymphedema  THERAPY DIAG: Aftercare following surgery for neoplasm  PERTINENT HISTORY:  Rt lumpectomy with SLNB 05/31/21 with Dr. Ninfa [place] 0/3 lymph nodes removed.  Starting radiation hopefully in February after a trip to [place].  Hx of lumpectomy on the left in 1999 but no lymph node removal. Knee OA   PRECAUTIONS: right UE Lymphedema risk, None  SUBJECTIVE: Pt returns for her 1 month reassess after having a high change from baseline indicating subclinical lymphedema.   PAIN:  Are you having pain? No  SOZO SCREENING: Patient was assessed today using the SOZO machine to determine the lymphedema index score. This was compared to her baseline score. It was determined that she is within the recommended range when compared to her baseline and no further action is needed at this time. She will continue SOZO screenings. These are done every 3 months for 2 years post operatively followed by  every 6 months for 2 years, and then annually.   L-DEX FLOWSHEETS - 02/07/22 1400       L-DEX LYMPHEDEMA SCREENING   Measurement Type Unilateral    L-DEX MEASUREMENT EXTREMITY Upper Extremity    POSITION  Standing    DOMINANT SIDE Right    At Risk Side Right    BASELINE SCORE (UNILATERAL) -9.2    L-DEX SCORE (UNILATERAL) -9.1    VALUE CHANGE (UNILAT) 0.1              Otelia Limes, PTA 02/07/2022, 3:00 PM

## 2022-02-08 DIAGNOSIS — H2512 Age-related nuclear cataract, left eye: Secondary | ICD-10-CM | POA: Diagnosis not present

## 2022-03-07 ENCOUNTER — Other Ambulatory Visit: Payer: Self-pay | Admitting: Neurology

## 2022-03-07 DIAGNOSIS — E782 Mixed hyperlipidemia: Secondary | ICD-10-CM

## 2022-03-07 DIAGNOSIS — E781 Pure hyperglyceridemia: Secondary | ICD-10-CM

## 2022-03-07 MED ORDER — ATORVASTATIN CALCIUM 40 MG PO TABS: 40.0000 mg | ORAL_TABLET | Freq: Every day | ORAL | 0 refills | Status: DC

## 2022-03-14 DIAGNOSIS — Z124 Encounter for screening for malignant neoplasm of cervix: Secondary | ICD-10-CM | POA: Diagnosis not present

## 2022-03-14 DIAGNOSIS — Z01419 Encounter for gynecological examination (general) (routine) without abnormal findings: Secondary | ICD-10-CM | POA: Diagnosis not present

## 2022-03-14 DIAGNOSIS — Z853 Personal history of malignant neoplasm of breast: Secondary | ICD-10-CM | POA: Diagnosis not present

## 2022-03-14 DIAGNOSIS — Z6834 Body mass index (BMI) 34.0-34.9, adult: Secondary | ICD-10-CM | POA: Diagnosis not present

## 2022-03-14 DIAGNOSIS — Z1151 Encounter for screening for human papillomavirus (HPV): Secondary | ICD-10-CM | POA: Diagnosis not present

## 2022-04-08 ENCOUNTER — Ambulatory Visit: Payer: Medicare PPO | Admitting: Sports Medicine

## 2022-04-08 ENCOUNTER — Ambulatory Visit (INDEPENDENT_AMBULATORY_CARE_PROVIDER_SITE_OTHER): Payer: Medicare PPO

## 2022-04-08 DIAGNOSIS — M170 Bilateral primary osteoarthritis of knee: Secondary | ICD-10-CM

## 2022-04-08 NOTE — Progress Notes (Signed)
    Procedures performed today:    Procedure: Real-time Ultrasound Guided injection of the left knee Device: Samsung HS60  Verbal informed consent obtained.  Time-out conducted.  Noted no overlying erythema, induration, or other signs of local infection.  Skin prepped in a sterile fashion.  Local anesthesia: Topical Ethyl chloride.  With sterile technique and under real time ultrasound guidance: Moderate effusion noted, 1 cc Kenalog 40, 2 cc lidocaine, 2 cc bupivacaine injected easily Completed without difficulty  Advised to call if fevers/chills, erythema, induration, drainage, or persistent bleeding.  Images permanently stored and available for review in PACS.  Impression: Technically successful ultrasound guided injection.  Procedure: Real-time Ultrasound Guided injection of the right knee Device: Samsung HS60  Verbal informed consent obtained.  Time-out conducted.  Noted no overlying erythema, induration, or other signs of local infection.  Skin prepped in a sterile fashion.  Local anesthesia: Topical Ethyl chloride.  With sterile technique and under real time ultrasound guidance: Moderate effusion noted, 1 cc Kenalog 40, 2 cc lidocaine, 2 cc bupivacaine injected easily Completed without difficulty  Advised to call if fevers/chills, erythema, induration, drainage, or persistent bleeding.  Images permanently stored and available for review in PACS.  Impression: Technically successful ultrasound guided injection.  Independent interpretation of notes and tests performed by another provider:   None.  Brief History, Exam, Impression, and Recommendations:    Primary osteoarthritis of both knees Janice Dudley returns, she is a very pleasant 71 year old female with known bilateral knee osteoarthritis, last injection was in February 2023, she did well until recently, she has a recurrence of pain so we repeated bilateral injections, return as needed.  Chronic process with exacerbation and  pharmacologic intervention  ____________________________________________ Janice Dudley. [name], M.D., ABFM., CAQSM., AME. Primary Care and Sports Medicine [facility]  Adjunct Professor of [employer] [facility]  Risk manager

## 2022-04-08 NOTE — Assessment & Plan Note (Signed)
Janice Dudley returns, she is a very pleasant 71 year old female with known bilateral knee osteoarthritis, last injection was in February 2023, she did well until recently, she has a recurrence of pain so we repeated bilateral injections, return as needed.

## 2022-04-25 ENCOUNTER — Ambulatory Visit
Admission: RE | Admit: 2022-04-25 | Discharge: 2022-04-25 | Disposition: A | Discharge status: Home / Self Care | Payer: Medicare PPO | Source: Ambulatory Visit | Attending: Hematology and Oncology | Admitting: Hematology and Oncology

## 2022-04-25 DIAGNOSIS — Z853 Personal history of malignant neoplasm of breast: Secondary | ICD-10-CM | POA: Diagnosis not present

## 2022-04-25 DIAGNOSIS — C50511 Malignant neoplasm of lower-outer quadrant of right female breast: Secondary | ICD-10-CM

## 2022-04-25 DIAGNOSIS — R928 Other abnormal and inconclusive findings on diagnostic imaging of breast: Secondary | ICD-10-CM | POA: Diagnosis not present

## 2022-04-25 HISTORY — DX: Personal history of irradiation: Z92.3

## 2022-05-04 ENCOUNTER — Encounter: Payer: Self-pay | Admitting: Family Medicine

## 2022-05-04 ENCOUNTER — Ambulatory Visit (INDEPENDENT_AMBULATORY_CARE_PROVIDER_SITE_OTHER): Payer: Medicare PPO | Admitting: Family Medicine

## 2022-05-04 VITALS — BP 123/71 | HR 82 | Ht 61.0 in | Wt 186.0 lb

## 2022-05-04 DIAGNOSIS — R42 Dizziness and giddiness: Secondary | ICD-10-CM | POA: Diagnosis not present

## 2022-05-04 DIAGNOSIS — R6884 Jaw pain: Secondary | ICD-10-CM | POA: Diagnosis not present

## 2022-05-04 MED ORDER — ALBUTEROL SULFATE HFA 108 (90 BASE) MCG/ACT IN AERS: 2.0000 | INHALATION_SPRAY | Freq: Four times a day (QID) | RESPIRATORY_TRACT | 0 refills | Status: AC | PRN

## 2022-05-04 MED ORDER — MECLIZINE HCL 25 MG PO TABS: 25.0000 mg | ORAL_TABLET | Freq: Two times a day (BID) | ORAL | 0 refills | Status: DC | PRN

## 2022-05-04 NOTE — Assessment & Plan Note (Signed)
-   since it resolves with tums or a burp likely this is reflux related  - differential could also include TMJ and pt could be jaw clenching at night - did recommend she discuss with her dentist  - this does not sound cardiac to me but I have reached out to her PCP since she said she went through a lot last year

## 2022-05-04 NOTE — Assessment & Plan Note (Addendum)
-   on exam negative for BPPV sounds like a one time thing - did give pt 5 tablets of meclizine to have if this occurs again at night she can take and we can further assess/workup - ear exam was completely normal and balance/gait were normal as well.  - provided pt with handout about vertigo and encouraged hydration.

## 2022-05-04 NOTE — Progress Notes (Signed)
Acute Office Visit  Subjective:     Patient ID: Janice Dudley, female    DOB: 24-Jun-1951, 71 y.o.   MRN: [id number]  Chief Complaint  Patient presents with   Dizziness    Dizziness Pertinent negatives include no chest pain, chills, coughing, fever or headaches.   Patient is in today for vertigo symptoms. She had a vertigo episode about a week and a half ago associated with jaw pain. She felt the room spinning and had this happen for hours. Sleeping was the only thing that made it better. She closed her eyes and put her hand on her husband which says that was the only thing that stopped the spinning. She has not had this happen. She denies fainting.   She notices sometimes she has stiffness in her jaw and will feel a gas bubble or burp. She says that it will go away with tums. Denies any symptoms down her arm.   Review of Systems  Constitutional:  Negative for chills and fever.  Respiratory:  Negative for cough and shortness of breath.   Cardiovascular:  Negative for chest pain.  Neurological:  Positive for dizziness. Negative for headaches.      Objective:    BP 123/71   Pulse 82   Ht '5\' 1"'$  (1.549 m)   Wt 186 lb (84.4 kg)   SpO2 97%   BMI 35.14 kg/m    Physical Exam Vitals and nursing note reviewed.  Constitutional:      General: She is not in acute distress.    Appearance: Normal appearance.  HENT:     Head: Normocephalic and atraumatic.     Comments: Negative dix hallpike bilaterally    Right Ear: Tympanic membrane, ear canal and external ear normal.     Left Ear: Tympanic membrane, ear canal and external ear normal.     Nose: Nose normal.  Eyes:     Conjunctiva/sclera: Conjunctivae normal.  Cardiovascular:     Rate and Rhythm: Normal rate and regular rhythm.  Pulmonary:     Effort: Pulmonary effort is normal.     Breath sounds: Normal breath sounds.  Neurological:     General: No focal deficit present.     Mental Status: She is alert and  oriented to person, place, and time.  Psychiatric:        Mood and Affect: Mood normal.        Behavior: Behavior normal.        Thought Content: Thought content normal.        Judgment: Judgment normal.     No results found for any visits on 05/04/22.      Assessment & Plan:   Problem List Items Addressed This Visit       Other   Vertigo - Primary    - on exam negative for BPPV sounds like a one time thing - did give pt 5 tablets of meclizine to have if this occurs again at night she can take and we can further assess/workup - ear exam was completely normal and balance/gait were normal as well.        Meds ordered this encounter  Medications   albuterol (VENTOLIN HFA) 108 (90 Base) MCG/ACT inhaler    Sig: Inhale 2 puffs into the lungs every 6 (six) hours as needed for wheezing or shortness of breath.    Dispense:  25.5 g    Refill:  0    **Patient requests 90 days supply**   meclizine (  ANTIVERT) 25 MG tablet    Sig: Take 1 tablet (25 mg total) by mouth 2 (two) times daily as needed for dizziness.    Dispense:  5 tablet    Refill:  0    Return in about 3 months (around 08/04/2022), or with jade/PCP.  Owens Loffler, DO

## 2022-05-06 ENCOUNTER — Encounter: Payer: Self-pay | Admitting: Hematology and Oncology

## 2022-05-06 ENCOUNTER — Inpatient Hospital Stay: Payer: Medicare PPO | Attending: Hematology and Oncology | Admitting: Hematology and Oncology

## 2022-05-06 VITALS — BP 129/68 | HR 73 | Temp 97.2°F | Resp 18 | Wt 187.4 lb

## 2022-05-06 DIAGNOSIS — Z803 Family history of malignant neoplasm of breast: Secondary | ICD-10-CM | POA: Insufficient documentation

## 2022-05-06 DIAGNOSIS — Z170 Estrogen receptor positive status [ER+]: Secondary | ICD-10-CM | POA: Diagnosis not present

## 2022-05-06 DIAGNOSIS — Z801 Family history of malignant neoplasm of trachea, bronchus and lung: Secondary | ICD-10-CM | POA: Insufficient documentation

## 2022-05-06 DIAGNOSIS — Z853 Personal history of malignant neoplasm of breast: Secondary | ICD-10-CM | POA: Insufficient documentation

## 2022-05-06 DIAGNOSIS — Z923 Personal history of irradiation: Secondary | ICD-10-CM | POA: Insufficient documentation

## 2022-05-06 DIAGNOSIS — Z8049 Family history of malignant neoplasm of other genital organs: Secondary | ICD-10-CM | POA: Diagnosis not present

## 2022-05-06 DIAGNOSIS — Z79811 Long term (current) use of aromatase inhibitors: Secondary | ICD-10-CM | POA: Diagnosis not present

## 2022-05-06 DIAGNOSIS — Z8042 Family history of malignant neoplasm of prostate: Secondary | ICD-10-CM | POA: Insufficient documentation

## 2022-05-06 DIAGNOSIS — Z87891 Personal history of nicotine dependence: Secondary | ICD-10-CM | POA: Diagnosis not present

## 2022-05-06 DIAGNOSIS — C50511 Malignant neoplasm of lower-outer quadrant of right female breast: Secondary | ICD-10-CM

## 2022-05-06 NOTE — Progress Notes (Signed)
[facility]  [phone] [phone]    ID: Janice Dudley DOB: 11/28/1950  MR#: [id number]  HYW#:[id number]  Patient Care Team: Lavada Mesi as PCP - General (Family Medicine) Mauro Kaufmann, RN as Oncology Nurse Navigator Rockwell Germany, RN as Oncology Nurse Navigator Janice Keens, MD as Consulting Physician (General Surgery) Magrinat, [name], MD (Inactive) as Consulting Physician (Oncology) [name], MD as Consulting Physician (Radiation Oncology) [name], MD as Consulting Physician (Obstetrics and Gynecology) Silverio Decamp, MD as Consulting Physician (Sports Medicine) [name], MD as Referring Physician (Specialist) [name], MD OTHER MD:  CHIEF COMPLAINT: Estrogen receptor positive breast cancer  CURRENT TREATMENT: Anastrazole.  INTERVAL HISTORY:  She is tolerating anastrozole very well, no complaints. No other adverse effects today.  She is understandably worried about possibility of breast cancer recurrence.  Rest of the pertinent 10 point ROS reviewed and negative  HISTORY OF CURRENT ILLNESS:  Oncology History  Malignant neoplasm of lower-outer quadrant of right breast of female, estrogen receptor positive ([place])  04/12/2021 Mammogram    She underwent bilateral diagnostic mammography with tomography and bilateral breast ultrasonography at [facility] on 04/12/2021 showing: breast density category B; new indeterminate 7 mm mass in right breast at 7 o'clock; no evidence of right axillary adenopathy; the likely-benign left breast mass at 4 o'clock is stable.   04/15/2021 Pathology Results    The pathology from this procedure ([id number]) showed: invasive ductal carcinoma, grade 2/3. Prognostic indicators significant for: estrogen receptor, >95% positive with strong staining intensity and progesterone receptor, 40% positive with moderate staining intensity. Proliferation  marker Ki67 at 15%. HER2 equivocal by immunohistochemistry (2+), but negative by fluorescent in situ hybridization with a signals ratio 2.02 and 1.89 and number per cell 4.55 and 4.15}.  She also underwent biopsy of the left breast mass on 04/27/2021. Pathology ([id number]) was benign, showing fibrocystic changes including apocrine metaplasia.   04/23/2021 Initial Diagnosis   Malignant neoplasm of lower-outer quadrant of right breast of female, estrogen receptor positive ([place])   04/28/2021 Cancer Staging   Staging form: Breast, AJCC 8th Edition - Clinical stage from 04/28/2021: Stage IA (cT1b, cN0, cM0, G3, ER+, PR+, HER2-) - Signed by Chauncey Cruel, MD on 04/28/2021 Stage prefix: Initial diagnosis Histologic grading system: 3 grade system   05/05/2021 Genetic Testing   [facility] CustomNext Panel was negative. No pathogenic variants were identified in the 47 genes analyzed. Report date is 05/10/2021.   The CustomNext-Cancer+RNAinsight panel offered by [facility] includes sequencing and rearrangement analysis for the following 47 genes:  APC, ATM, AXIN2, BARD1, BMPR1A, BRCA1, BRCA2, BRIP1, CDH1, CDK4, CDKN2A, CHEK2, DICER1, EPCAM, GREM1, HOXB13, MEN1, MLH1, MSH2, MSH3, MSH6, MUTYH, NBN, NF1, NF2, NTHL1, PALB2, PMS2, POLD1, POLE, PTEN, RAD51C, RAD51D, RECQL, RET, SDHA, SDHAF2, SDHB, SDHC, SDHD, SMAD4, SMARCA4, STK11, TP53, TSC1, TSC2, and VHL.  RNA data is routinely analyzed for use in variant interpretation for all genes.    05/31/2021 Surgery   right lumpectomy with sentinel lymph node sampling 05/31/2021 showed a pT1a pN0, stage IA invasive ductal carcinoma, grade 2, with negative margins (a) a total of 3 right axillary lymph nodes were removed.    - 08/05/2021 Radiation Therapy   Completed adjuvant radiation      PAST MEDICAL HISTORY: Past Medical History:  Diagnosis Date   Allergy 1960s, 1970s, 2000   Arthritis    Breast cancer ([place])    Cataract    no surgery yet  Hyperlipidemia     Personal history of radiation therapy     PAST SURGICAL HISTORY: Past Surgical History:  Procedure Laterality Date   BREAST EXCISIONAL BIOPSY Left 1999   BREAST LUMPECTOMY Right 05/31/2021   BREAST LUMPECTOMY WITH RADIOACTIVE SEED AND SENTINEL LYMPH NODE BIOPSY Right 05/31/2021   Procedure: RIGHT BREAST LUMPECTOMY WITH RADIOACTIVE SEED AND SENTINEL LYMPH NODE BIOPSY;  Surgeon: Janice Keens, MD;  Location: [facility];  Service: General;  Laterality: Right;   BREAST SURGERY  1999   Lumpectomy   CESAREAN SECTION  1982/1983   2 times   COLONOSCOPY  2008   EYE SURGERY  01/2021   Vitrectomy   LAPAROSCOPY     abd/tubal ligation   TUBAL LIGATION  1988   VITRECTOMY      FAMILY HISTORY: Family History  Problem Relation Age of Onset   Stroke Mother    Heart attack Mother 4   Cancer Mother        Uterine   Breast cancer Mother 5   Heart attack Father 38   Cancer Father        Lung   Stroke Father    Hyperlipidemia Sister    Breast cancer Sister 5   Cancer Sister    Hyperlipidemia Sister    Thyroid nodules Brother    Hyperlipidemia Brother    Prostate cancer Brother 24   Cancer Brother    Hyperlipidemia Brother    Lung cancer Brother 47   Thyroid cancer Brother        dx. early 69s   Cancer Brother    Hyperlipidemia Brother    Anxiety disorder Daughter    Depression Daughter    Colon cancer Neg Hx    Esophageal cancer Neg Hx    Rectal cancer Neg Hx    Her father died at age 81 from [place]. He had a history of lung cancer at age 43. Her mother died at age 27 from uterine cancer. She had a history of breast cancer at age 44 and apparently was treated with tamoxifen. Janice Dudley has three brothers and three sisters. One sister had breast and uterine cancer at age 58 (possibly also treated with tamoxifen), a brother had lung cancer at age 52, and another brother had prostate cancer at age 41.  There is no history of colon cancer in the family   GYNECOLOGIC  HISTORY:  No LMP recorded. Patient is postmenopausal. Menarche: 71 years old Age at first live birth: 71 years old [place] P 2 LMP 2006 Contraceptive: used for 12 years HRT: never used  Hysterectomy? no BSO? no   SOCIAL HISTORY: (updated 04/2021)  Janice "Janice Dudley" is currently retired from working as a Pharmacist, hospital for pre-K to [employer]. Husband [name] is a retired Government social research officer. Daughter [name], age 41, is a Secretary/administrator in [place], [place]. Son [name], age 97, is an MRI tech in [place]. Janice Dudley has three grandchildren. She attends a [employer] in [place].    ADVANCED DIRECTIVES: in place   HEALTH MAINTENANCE: Social History   Tobacco Use   Smoking status: Former    Packs/day: 0.25    Years: 20.00    Total pack years: 5.00    Types: Cigarettes    Quit date: 05/09/1989    Years since quitting: 33.0   Smokeless tobacco: Never  Vaping Use   Vaping Use: Never used  Substance Use Topics   Alcohol use: Yes    Alcohol/week: 1.0 - 2.0 standard drink of alcohol  Types: 1 - 2 Glasses of wine per week    Comment: wine    Drug use: No     Colonoscopy: 04/2018 (Dr. Ardis Hughs), recall 2029  PAP: 2021  Bone density: 2021   Allergies  Allergen Reactions   Peach [Prunus Persica]     Hives/itching   Sulfa Antibiotics Hives   Topamax [Topiramate]     Bilateral leg pain   Shellfish Allergy Hives    Current Outpatient Medications  Medication Sig Dispense Refill   albuterol (VENTOLIN HFA) 108 (90 Base) MCG/ACT inhaler Inhale 2 puffs into the lungs every 6 (six) hours as needed for wheezing or shortness of breath. 25.5 g 0   anastrozole (ARIMIDEX) 1 MG tablet Take 1 tablet (1 mg total) by mouth daily. 90 tablet 4   aspirin 81 MG tablet Take 81 mg by mouth daily.     atorvastatin (LIPITOR) 40 MG tablet Take 1 tablet (40 mg total) by mouth daily. Need labs 90 tablet 0   b complex vitamins tablet Take 1 tablet by mouth daily.     buPROPion (WELLBUTRIN SR) 150 MG 12 hr tablet TAKE  1 TABLET(150 MG) BY MOUTH TWICE DAILY 180 tablet 0   calcium carbonate (TUMS EX) 750 MG chewable tablet Chew 1 tablet by mouth daily.     cholecalciferol (VITAMIN D) 1000 UNITS tablet Take 1,000 Units by mouth daily.     hydrochlorothiazide (HYDRODIURIL) 12.5 MG tablet Take 1 tablet (12.5 mg total) by mouth daily. 90 tablet 3   KRILL OIL PO Take by mouth.     Magnesium 250 MG TABS Take by mouth daily.      meclizine (ANTIVERT) 25 MG tablet Take 1 tablet (25 mg total) by mouth 2 (two) times daily as needed for dizziness. 5 tablet 0   MELATONIN PO Take 10 mg by mouth at bedtime.      METRONIDAZOLE, TOPICAL, 0.75 % LOTN APPLY TOPICALLY TO THE AFFECTED AREA IN THE MORNING AND AT BEDTIME 59 mL 0   traZODone (DESYREL) 50 MG tablet Take 1-2 tablets (50-100 mg total) by mouth at bedtime. 1 HOUR BEFORE BEDTIME AS NEEDED 180 tablet 3   No current facility-administered medications for this visit.    OBJECTIVE: White woman in no acute distress  There were no vitals filed for this visit.    There is no height or weight on file to calculate BMI.   Wt Readings from Last 3 Encounters:  05/04/22 186 lb (84.4 kg)  02/07/22 185 lb (83.9 kg)  01/17/22 185 lb 2 oz (84 kg)    ECOG FS:1 - Symptomatic but completely ambulatory  Physical Exam Constitutional:      Appearance: Normal appearance.  Chest:     Comments: Bilateral breasts inspected.  No palpable masses or regional adenopathy Musculoskeletal:     Cervical back: Normal range of motion. No rigidity.  Lymphadenopathy:     Cervical: No cervical adenopathy.  Neurological:     Mental Status: She is alert.     LAB RESULTS:  CMP     Component Value Date/Time   NA 135 04/28/2021 1231   K 4.1 04/28/2021 1231   CL 99 04/28/2021 1231   CO2 26 04/28/2021 1231   GLUCOSE 96 04/28/2021 1231   BUN 13 04/28/2021 1231   CREATININE 0.90 04/28/2021 1231   CREATININE 0.79 12/25/2020 1000   CALCIUM 9.9 04/28/2021 1231   PROT 7.1 04/28/2021 1231    ALBUMIN 4.1 04/28/2021 1231   AST 20 04/28/2021  1231   ALT 16 04/28/2021 1231   ALKPHOS 101 04/28/2021 1231   BILITOT 0.6 04/28/2021 1231   GFRNONAA >60 04/28/2021 1231   GFRNONAA 77 12/25/2020 1000   GFRAA 89 12/25/2020 1000    No results found for: "TOTALPROTELP", "ALBUMINELP", "A1GS", "A2GS", "BETS", "BETA2SER", "GAMS", "MSPIKE", "SPEI"  Lab Results  Component Value Date   WBC 6.4 04/28/2021   NEUTROABS 4.4 04/28/2021   HGB 12.6 04/28/2021   HCT 37.3 04/28/2021   MCV 93.3 04/28/2021   PLT 291 04/28/2021    No results found for: "LABCA2"  No components found for: "AJGOTL572"  No results for input(s): "INR" in the last 168 hours.  No results found for: "LABCA2"  No results found for: "IOM355"  No results found for: "CAN125"  No results found for: "CAN153"  No results found for: "CA2729"  No components found for: "HGQUANT"  No results found for: "CEA1", "CEA" / No results found for: "CEA1", "CEA"   No results found for: "AFPTUMOR"  No results found for: "CHROMOGRNA"  No results found for: "KPAFRELGTCHN", "LAMBDASER", "KAPLAMBRATIO" (kappa/lambda light chains)  No results found for: "HGBA", "HGBA2QUANT", "HGBFQUANT", "HGBSQUAN" (Hemoglobinopathy evaluation)   No results found for: "LDH"  No results found for: "IRON", "TIBC", "IRONPCTSAT" (Iron and TIBC)  No results found for: "FERRITIN"  Urinalysis    Component Value Date/Time   BILIRUBINUR negative 03/28/2017 1622   KETONESUR negative 03/20/2014 1447   PROTEINUR negative 03/28/2017 1622   UROBILINOGEN 0.2 03/28/2017 1622   NITRITE positive 03/28/2017 1622   LEUKOCYTESUR Trace (A) 03/28/2017 1622   STUDIES: MM DIAG BREAST TOMO BILATERAL  Result Date: 04/25/2022 CLINICAL DATA:  History of a right lumpectomy for breast carcinoma, performed on 05/31/2021, treated with adjuvant radiation therapy. Patient currently on anastrozole. EXAM: DIGITAL DIAGNOSTIC BILATERAL MAMMOGRAM WITH TOMOSYNTHESIS  TECHNIQUE: Bilateral digital diagnostic mammography and breast tomosynthesis was performed. COMPARISON:  Previous exam(s). ACR Breast Density Category b: There are scattered areas of fibroglandular density. FINDINGS: Post lumpectomy changes noted in the retroareolar right breast. There are no masses, areas of nonsurgical architectural distortion or suspicious calcifications. IMPRESSION: 1. No evidence of new or recurrent breast carcinoma. 2. Benign post lumpectomy changes on the right. RECOMMENDATION: 1. Diagnostic mammography in 1 year per standard post lumpectomy protocol. I have discussed the findings and recommendations with the patient. If applicable, a reminder letter will be sent to the patient regarding the next appointment. BI-RADS CATEGORY  2: Benign. Electronically Signed   By: Lajean Manes M.D.   On: 04/25/2022 15:23  [place] LIMITED JOINT SPACE STRUCTURES LOW BILAT  Result Date: 04/08/2022 Procedure: Real-time Ultrasound Guided injection of the left knee Device: Samsung [id number] Verbal informed consent obtained. Time-out conducted. Noted no overlying erythema, induration, or other signs of local infection. Skin prepped in a sterile fashion. Local anesthesia: Topical Ethyl chloride. With sterile technique and under real time ultrasound guidance: Moderate effusion noted, 1 cc Kenalog 40, 2 cc lidocaine, 2 cc bupivacaine injected easily Completed without difficulty Advised to call if fevers/chills, erythema, induration, drainage, or persistent bleeding. Images permanently stored and available for review in PACS. Impression: Technically successful ultrasound guided injection. Procedure: Real-time Ultrasound Guided injection of the right knee Device: Samsung [id number] Verbal informed consent obtained. Time-out conducted. Noted no overlying erythema, induration, or other signs of local infection. Skin prepped in a sterile fashion. Local anesthesia: Topical Ethyl chloride. With sterile technique and under real time  ultrasound guidance: Moderate effusion noted, 1 cc Kenalog 40, 2 cc lidocaine, 2  cc bupivacaine injected easily Completed without difficulty Advised to call if fevers/chills, erythema, induration, drainage, or persistent bleeding. Images permanently stored and available for review in PACS. Impression: Technically successful ultrasound guided injection.     ELIGIBLE FOR AVAILABLE RESEARCH PROTOCOL: no  ASSESSMENT/PLAN  This is a very pleasant 71 year old female patient with recently diagnosed right breast T1 a N0 M0 ER/PR strongly positive and HER2 negative breast cancer status post right lumpectomy, adjuvant radiation now on antiestrogen therapy with anastrozole who is here for follow-up.   She is tolerating anastrozole well. No complaints today Most recent mammogram 04/25/2022, No new evidence of new or recurrent breast carcinoma. No concerning findings on physical exam today. We once again discussed about continuing anastrozole and RTC in one yr, alternate with surgery team for appointments. She understands that the risk of recurrence is thankfully low with her stage and prognostics. Encouraged calcium, vitamin D supplementation and regular activity.  Total time spent: 30 minutes  *Total Encounter Time as defined by the Centers for Medicare and Medicaid Services includes, in addition to the face-to-face time of a patient visit (documented in the note above) non-face-to-face time: obtaining and reviewing outside history, ordering and reviewing medications, tests or procedures, care coordination (communications with other health care professionals or caregivers) and documentation in the medical record.

## 2022-05-12 ENCOUNTER — Encounter: Payer: Self-pay | Admitting: Hematology and Oncology

## 2022-05-13 ENCOUNTER — Ambulatory Visit: Payer: Medicare PPO | Admitting: Hematology and Oncology

## 2022-05-16 ENCOUNTER — Ambulatory Visit: Payer: Medicare PPO

## 2022-05-16 ENCOUNTER — Other Ambulatory Visit: Payer: Self-pay | Admitting: Physician Assistant

## 2022-05-16 DIAGNOSIS — E6609 Other obesity due to excess calories: Secondary | ICD-10-CM

## 2022-05-19 NOTE — Progress Notes (Signed)
Spoke with patient, she is feeling back to normal. Will call if anything changes.

## 2022-06-02 ENCOUNTER — Other Ambulatory Visit: Payer: Self-pay | Admitting: Physician Assistant

## 2022-06-02 DIAGNOSIS — E782 Mixed hyperlipidemia: Secondary | ICD-10-CM

## 2022-06-02 DIAGNOSIS — E781 Pure hyperglyceridemia: Secondary | ICD-10-CM

## 2022-06-23 ENCOUNTER — Other Ambulatory Visit: Payer: Self-pay | Admitting: Physician Assistant

## 2022-06-23 DIAGNOSIS — L719 Rosacea, unspecified: Secondary | ICD-10-CM

## 2022-08-05 ENCOUNTER — Ambulatory Visit: Payer: Medicare PPO | Admitting: Physician Assistant

## 2022-08-05 ENCOUNTER — Encounter: Payer: Self-pay | Admitting: Physician Assistant

## 2022-08-05 VITALS — BP 118/70 | HR 76 | Ht 61.0 in | Wt 185.0 lb

## 2022-08-05 DIAGNOSIS — E6609 Other obesity due to excess calories: Secondary | ICD-10-CM | POA: Diagnosis not present

## 2022-08-05 DIAGNOSIS — E782 Mixed hyperlipidemia: Secondary | ICD-10-CM | POA: Diagnosis not present

## 2022-08-05 DIAGNOSIS — M65332 Trigger finger, left middle finger: Secondary | ICD-10-CM

## 2022-08-05 DIAGNOSIS — Z131 Encounter for screening for diabetes mellitus: Secondary | ICD-10-CM

## 2022-08-05 DIAGNOSIS — Z79899 Other long term (current) drug therapy: Secondary | ICD-10-CM

## 2022-08-05 DIAGNOSIS — H01146 Xeroderma of left eye, unspecified eyelid: Secondary | ICD-10-CM

## 2022-08-05 DIAGNOSIS — Z6834 Body mass index (BMI) 34.0-34.9, adult: Secondary | ICD-10-CM

## 2022-08-05 DIAGNOSIS — G479 Sleep disorder, unspecified: Secondary | ICD-10-CM

## 2022-08-05 DIAGNOSIS — R600 Localized edema: Secondary | ICD-10-CM

## 2022-08-05 DIAGNOSIS — F4323 Adjustment disorder with mixed anxiety and depressed mood: Secondary | ICD-10-CM

## 2022-08-05 DIAGNOSIS — Z1329 Encounter for screening for other suspected endocrine disorder: Secondary | ICD-10-CM

## 2022-08-05 DIAGNOSIS — Z6835 Body mass index (BMI) 35.0-35.9, adult: Secondary | ICD-10-CM

## 2022-08-05 MED ORDER — BUPROPION HCL ER (SR) 150 MG PO TB12: 150.0000 mg | ORAL_TABLET | Freq: Two times a day (BID) | ORAL | 3 refills | Status: DC

## 2022-08-05 MED ORDER — TRAZODONE HCL 50 MG PO TABS: 50.0000 mg | ORAL_TABLET | Freq: Every day | ORAL | 3 refills | Status: DC

## 2022-08-05 MED ORDER — HYDROCHLOROTHIAZIDE 12.5 MG PO TABS: 12.5000 mg | ORAL_TABLET | Freq: Every day | ORAL | 3 refills | Status: DC

## 2022-08-05 NOTE — Patient Instructions (Addendum)
Wear brace use diclofenac gel as needed Dr. Darene Lamer for injection if continues Trigger Finger  Trigger finger, also called stenosing tenosynovitis,  is a condition that causes a finger to get stuck in a bent position. Each finger has a tendon, which is a tough, cord-like tissue that connects muscle to bone, and each tendon passes through a tunnel of tissue called a tendon sheath. To move your finger, your tendon needs to glide freely through the sheath. Trigger finger happens when the tendon or the sheath thickens, making it difficult to move your finger. Trigger finger can affect any finger or a thumb. It may affect more than one finger. Mild cases may clear up with rest and medicine. Severe cases require more treatment. What are the causes? Trigger finger is caused by a thickened finger tendon or tendon sheath. The cause of this thickening is not known. What increases the risk? The following factors may make you more likely to develop this condition: Doing activities that require a strong grip. Having rheumatoid arthritis, gout, or diabetes. Being 50-92 years old. Being female. What are the signs or symptoms? Symptoms of this condition include: Pain when bending or straightening your finger. Tenderness or swelling where your finger attaches to the palm of your hand. A lump in the palm of your hand or on the inside of your finger. Hearing a noise like a pop or a snap when you try to straighten your finger. Feeling a catching or locking sensation when you try to straighten your finger. Being unable to straighten your finger. How is this diagnosed? This condition is diagnosed based on your symptoms and a physical exam. How is this treated? This condition may be treated by: Resting your finger and avoiding activities that make symptoms worse. Wearing a finger splint to keep your finger extended. Taking NSAIDs, such as ibuprofen, to relieve pain and swelling. Doing gentle exercises to stretch the  finger as told by your health care provider. Having medicine that reduces swelling and inflammation (steroids) injected into the tendon sheath. Injections may need to be repeated. Having surgery to open the tendon sheath. This may be done if other treatments do not work and you cannot straighten your finger. You may need physical therapy after surgery. Follow these instructions at home: If you have a splint: Wear the splint as told by your health care provider. Remove it only as told by your health care provider. Loosen it if your fingers tingle, become numb, or turn cold and blue. Keep it clean. If the splint is not waterproof: Do not let it get wet. Cover it with a watertight covering when you take a bath or shower. Managing pain, stiffness, and swelling     If directed, apply heat to the affected area as often as told by your health care provider. Use the heat source that your health care provider recommends, such as a moist heat pack or a heating pad. Place a towel between your skin and the heat source. Leave the heat on for 20-30 minutes. Remove the heat if your skin turns bright red. This is especially important if you are unable to feel pain, heat, or cold. You may have a greater risk of getting burned. If directed, put ice on the painful area. To do this: If you have a removable splint, remove it as told by your health care provider. Put ice in a plastic bag. Place a towel between your skin and the bag or between your splint and the bag. Leave  the ice on for 20 minutes, 2-3 times a day.  Activity Rest your finger as told by your health care provider. Avoid activities that make the pain worse. Return to your normal activities as told by your health care provider. Ask your health care provider what activities are safe for you. Do exercises as told by your health care provider. Ask your health care provider when it is safe to drive if you have a splint on your hand. General  instructions Take over-the-counter and prescription medicines only as told by your health care provider. Keep all follow-up visits as told by your health care provider. This is important. Contact a health care provider if: Your symptoms are not improving with home care. Summary Trigger finger, also called stenosing tenosynovitis, causes your finger to get stuck in a bent position. This can make it difficult and painful to straighten your finger. This condition develops when a finger tendon or tendon sheath thickens. Treatment may include resting your finger, wearing a splint, and taking medicines. In severe cases, surgery to open the tendon sheath may be needed. This information is not intended to replace advice given to you by your health care provider. Make sure you discuss any questions you have with your health care provider. Document Revised: 11/26/2018 Document Reviewed: 11/26/2018 Elsevier Patient Education  [place].
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
## 2022-08-06 LAB — CBC WITH DIFFERENTIAL/PLATELET
Absolute Monocytes: 359 cells/uL (ref 200–950)
Basophils Absolute: 91 cells/uL (ref 0–200)
Basophils Relative: 1.6 %
Eosinophils Absolute: 279 cells/uL (ref 15–500)
Eosinophils Relative: 4.9 %
HCT: 39.4 % (ref 35.0–45.0)
Hemoglobin: 13.4 g/dL (ref 11.7–15.5)
Lymphs Abs: 695 cells/uL — ABNORMAL LOW (ref 850–3900)
MCH: 32.4 pg (ref 27.0–33.0)
MCHC: 34 g/dL (ref 32.0–36.0)
MCV: 95.4 fL (ref 80.0–100.0)
MPV: 10.4 fL (ref 7.5–12.5)
Monocytes Relative: 6.3 %
Neutro Abs: 4275 cells/uL (ref 1500–7800)
Neutrophils Relative %: 75 %
Platelets: 271 10*3/uL (ref 140–400)
RBC: 4.13 10*6/uL (ref 3.80–5.10)
RDW: 12.2 % (ref 11.0–15.0)
Total Lymphocyte: 12.2 %
WBC: 5.7 10*3/uL (ref 3.8–10.8)

## 2022-08-06 LAB — COMPLETE METABOLIC PANEL WITH GFR
AG Ratio: 2 (calc) (ref 1.0–2.5)
ALT: 16 U/L (ref 6–29)
AST: 17 U/L (ref 10–35)
Albumin: 4.3 g/dL (ref 3.6–5.1)
Alkaline phosphatase (APISO): 83 U/L (ref 37–153)
BUN: 13 mg/dL (ref 7–25)
CO2: 24 mmol/L (ref 20–32)
Calcium: 9.9 mg/dL (ref 8.6–10.4)
Chloride: 104 mmol/L (ref 98–110)
Creat: 0.86 mg/dL (ref 0.60–1.00)
Globulin: 2.1 g/dL (calc) (ref 1.9–3.7)
Glucose, Bld: 91 mg/dL (ref 65–99)
Potassium: 4.1 mmol/L (ref 3.5–5.3)
Sodium: 138 mmol/L (ref 135–146)
Total Bilirubin: 0.5 mg/dL (ref 0.2–1.2)
Total Protein: 6.4 g/dL (ref 6.1–8.1)
eGFR: 73 mL/min/{1.73_m2} (ref >=60)

## 2022-08-06 LAB — LIPID PANEL W/REFLEX DIRECT LDL
Cholesterol: 158 mg/dL (ref <200)
HDL: 65 mg/dL (ref >=50)
LDL Cholesterol (Calc): 77 mg/dL (calc)
Non-HDL Cholesterol (Calc): 93 mg/dL (calc) (ref <130)
Total CHOL/HDL Ratio: 2.4 (calc) (ref <5.0)
Triglycerides: 79 mg/dL (ref <150)

## 2022-08-06 LAB — TSH: TSH: 3.67 mIU/L (ref 0.40–4.50)

## 2022-08-08 ENCOUNTER — Other Ambulatory Visit: Payer: Self-pay | Admitting: Physician Assistant

## 2022-08-08 DIAGNOSIS — E781 Pure hyperglyceridemia: Secondary | ICD-10-CM

## 2022-08-08 DIAGNOSIS — E782 Mixed hyperlipidemia: Secondary | ICD-10-CM

## 2022-08-08 MED ORDER — ATORVASTATIN CALCIUM 40 MG PO TABS: 40.0000 mg | ORAL_TABLET | Freq: Every day | ORAL | 3 refills | Status: DC

## 2022-08-08 NOTE — Progress Notes (Signed)
Janice Dudley,   Cholesterol looks great.  Normal hemoglobin.  Thyroid is stable.  Kidney, liver, glucose look great.

## 2022-08-29 ENCOUNTER — Ambulatory Visit (INDEPENDENT_AMBULATORY_CARE_PROVIDER_SITE_OTHER): Payer: Medicare PPO | Admitting: Physician Assistant

## 2022-08-29 DIAGNOSIS — Z0000 Encounter for general adult medical examination without abnormal findings: Secondary | ICD-10-CM | POA: Diagnosis not present

## 2022-08-29 NOTE — Progress Notes (Signed)
MEDICARE ANNUAL WELLNESS VISIT  08/29/2022  Telephone Visit Disclaimer This Medicare AWV was conducted by telephone due to national recommendations for restrictions regarding the COVID-19 Pandemic (e.g. social distancing).  I verified, using two identifiers, that I am speaking with Janice Dudley or their authorized healthcare agent. I discussed the limitations, risks, security, and privacy concerns of performing an evaluation and management service by telephone and the potential availability of an in-person appointment in the future. The patient expressed understanding and agreed to proceed.  Location of Patient: Home Location of Provider (nurse):  In the office.  Subjective:    Janice Dudley is a 72 y.o. female patient of Janice Stade, PA-C who had a [employer] Visit today via telephone. [name] is Retired and lives with their spouse, daughter and grand daughter. she has 2 children. she reports that she is socially active and does interact with friends/family regularly. she is moderately physically active and enjoys reading and adult coloring.  Patient Care Team: Lavada Mesi as PCP - General (Family Medicine) Mauro Kaufmann, RN as Oncology Nurse Navigator Rockwell Germany, RN as Oncology Nurse Navigator Coralie Keens, MD as Consulting Physician (General Surgery) Magrinat, [name], MD (Inactive) as Consulting Physician (Oncology) [name], MD as Consulting Physician (Radiation Oncology) [name], MD as Consulting Physician (Obstetrics and Gynecology) Silverio Decamp, MD as Consulting Physician (Sports Medicine) [name], MD as Referring Physician (Specialist)     08/29/2022    8:55 AM 08/27/2021    9:06 AM 06/29/2021   10:20 AM 05/31/2021    7:17 AM 05/20/2021   11:38 AM 06/02/2020    9:00 AM 01/21/2019   10:03 AM  Advanced Directives  Does Patient Have a Medical Advance Directive? Yes Yes Yes No No No  Yes  Type of Advance Directive Living will Living will;Healthcare Power of Attorney Living will;Healthcare Power of [place];Living will  Does patient want to make changes to medical advance directive? No - Patient declined No - Patient declined No - Patient declined    No - Patient declined  Copy of [place] in Chart?  No - copy requested     No - copy requested  Would patient like information on creating a medical advance directive?    No - Patient declined No - Patient declined No - Patient declined     Hospital Utilization Over the Past 12 Months: # of hospitalizations or ER visits: 0 # of surgeries: 1-cataract surgery  Review of Systems    Patient reports that her overall health is better compared to last year.  History obtained from chart review and the patient  Patient Reported Readings (BP, Pulse, CBG, Weight, etc) none  Pain Assessment Pain : No/denies pain     Current Medications & Allergies (verified) Allergies as of 08/29/2022       Reactions   Peach [prunus Persica]    Hives/itching   Sulfa Antibiotics Hives   Topamax [topiramate]    Bilateral leg pain   Bioflavonoids Hives   Shellfish Allergy Hives, Other (See Comments)        Medication List        Accurate as of August 29, 2022  9:10 AM. If you have any questions, ask your nurse or doctor.          albuterol 108 (90 Base) MCG/ACT inhaler Commonly known as: VENTOLIN HFA Inhale 2 puffs into the  lungs every 6 (six) hours as needed for wheezing or shortness of breath.   anastrozole 1 MG tablet Commonly known as: ARIMIDEX Take 1 tablet (1 mg total) by mouth daily.   aspirin 81 MG tablet Take 81 mg by mouth daily.   atorvastatin 40 MG tablet Commonly known as: LIPITOR Take 1 tablet (40 mg total) by mouth daily.   b complex vitamins tablet Take 1 tablet by mouth daily.   buPROPion 150 MG 12 hr tablet Commonly known as: WELLBUTRIN SR Take 1  tablet (150 mg total) by mouth 2 (two) times daily.   calcium carbonate 750 MG chewable tablet Commonly known as: TUMS EX Chew 1 tablet by mouth daily.   cholecalciferol 1000 units tablet Commonly known as: VITAMIN D Take 1,000 Units by mouth daily.   hydrochlorothiazide 12.5 MG tablet Commonly known as: HYDRODIURIL Take 1 tablet (12.5 mg total) by mouth daily.   KRILL OIL PO Take by mouth.   Magnesium 250 MG Tabs Take by mouth daily.   MELATONIN PO Take 10 mg by mouth at bedtime.   METRONIDAZOLE (TOPICAL) 0.75 % Lotn APPLY TOPICALLY TO THE AFFECTED AREA IN THE MORNING AND AT BEDTIME   traZODone 50 MG tablet Commonly known as: DESYREL Take 1-2 tablets (50-100 mg total) by mouth at bedtime. 1 HOUR BEFORE BEDTIME AS NEEDED        History (reviewed): Past Medical History:  Diagnosis Date   Allergy 1960s, 1970s, 2000   Arthritis    Breast cancer ([place])    Cataract    no surgery yet   Genetic testing 05/10/2021   Hyperlipidemia    Personal history of radiation therapy    Past Surgical History:  Procedure Laterality Date   BREAST EXCISIONAL BIOPSY Left 1999   BREAST LUMPECTOMY Right 05/31/2021   BREAST LUMPECTOMY WITH RADIOACTIVE SEED AND SENTINEL LYMPH NODE BIOPSY Right 05/31/2021   Procedure: RIGHT BREAST LUMPECTOMY WITH RADIOACTIVE SEED AND SENTINEL LYMPH NODE BIOPSY;  Surgeon: Coralie Keens, MD;  Location: [facility];  Service: General;  Laterality: Right;   BREAST SURGERY  1999   Lumpectomy   CESAREAN SECTION  1982/1983   2 times   COLONOSCOPY  2008   EYE SURGERY  01/2021   Vitrectomy   LAPAROSCOPY     abd/tubal ligation   TUBAL LIGATION  1988   VITRECTOMY     Family History  Problem Relation Age of Onset   Stroke Mother    Heart attack Mother 40   Cancer Mother        Uterine   Breast cancer Mother 73   Heart attack Father 10   Cancer Father        Lung   Stroke Father    Hyperlipidemia Sister    Breast cancer Sister 58    Cancer Sister    Hyperlipidemia Sister    Thyroid nodules Brother    Hyperlipidemia Brother    Prostate cancer Brother 52   Cancer Brother    Hyperlipidemia Brother    Lung cancer Brother 37   Thyroid cancer Brother        dx. early 4s   Cancer Brother    Hyperlipidemia Brother    Anxiety disorder Daughter    Depression Daughter    Colon cancer Neg Hx    Esophageal cancer Neg Hx    Rectal cancer Neg Hx    Social History   Socioeconomic History   Marital status: Married    Spouse name: [name]  Number of children: 2   Years of education: 16   Highest education level: Bachelor's degree (e.g., BA, AB, BS)  Occupational History   Occupation: Pharmacist, hospital    Comment: retired  Tobacco Use   Smoking status: Former    Packs/day: 0.25    Years: 20.00    Total pack years: 5.00    Types: Cigarettes    Quit date: 05/09/1989    Years since quitting: 33.3   Smokeless tobacco: Never  Vaping Use   Vaping Use: Never used  Substance and Sexual Activity   Alcohol use: Yes    Alcohol/week: 1.0 - 2.0 standard drink of alcohol    Types: 1 - 2 Glasses of wine per week    Comment: wine    Drug use: No   Sexual activity: Not Currently    Birth control/protection: Post-menopausal  Other Topics Concern   Not on file  Social History Narrative   Lives with her husband, daughter and granddaughter. She has two children. She enjoys reading and adult coloring.   Social Determinants of Health   Financial Resource Strain: Low Risk  (08/29/2022)   Overall Financial Resource Strain (CARDIA)    Difficulty of Paying Living Expenses: Not hard at all  Food Insecurity: No Food Insecurity (08/29/2022)   Hunger Vital Sign    Worried About Running Out of Food in the Last Year: Never true    Ran Out of Food in the Last Year: Never true  Transportation Needs: No Transportation Needs (08/29/2022)   PRAPARE - Hydrologist (Medical): No    Lack of Transportation (Non-Medical): No   Physical Activity: Insufficiently Active (08/29/2022)   Exercise Vital Sign    Days of Exercise per Week: 3 days    Minutes of Exercise per Session: 30 min  Stress: No Stress Concern Present (08/29/2022)   [place]    Feeling of Stress : Not at all  Social Connections: [place] (08/29/2022)   Social Connection and Isolation Panel [NHANES]    Frequency of Communication with Friends and Family: More than three times a week    Frequency of Social Gatherings with Friends and Family: More than three times a week    Attends Religious Services: More than 4 times per year    Active Member of [employer] or Organizations: Yes    Attends Archivist Meetings: More than 4 times per year    Marital Status: Married    Activities of Daily Living    08/29/2022    9:01 AM  In your present state of health, do you have any difficulty performing the following activities:  Hearing? 1  Comment has noticed some hard of hearing in the left ear  Vision? 0  Difficulty concentrating or making decisions? 0  Walking or climbing stairs? 0  Dressing or bathing? 0  Doing errands, shopping? 0  Preparing Food and eating ? N  Using the Toilet? N  In the past six months, have you accidently leaked urine? N  Do you have problems with loss of bowel control? N  Managing your Medications? N  Managing your Finances? N  Housekeeping or managing your Housekeeping? N    Patient Education/ Literacy How often do you need to have someone help you when you read instructions, pamphlets, or other written materials from your doctor or pharmacy?: 1 - Never What is the last grade level you completed in school?: bachelor's degree  Exercise Current Exercise Habits: Structured exercise class, Type of exercise: Other - see comments (physical therapy), Time (Minutes): 30, Frequency (Times/Week): 3, Weekly Exercise (Minutes/Week): 90, Intensity: Moderate,  Exercise limited by: orthopedic condition(s)  Diet Patient reports consuming 3 meals a day and 1 snack(s) a day Patient reports that her primary diet is: Regular Patient reports that she does have regular access to food.   Depression Screen    08/29/2022    8:56 AM 08/05/2022    9:14 AM 05/04/2022    2:54 PM 10/06/2021    9:23 AM 08/27/2021    9:07 AM 06/30/2021   11:13 AM 12/25/2020    9:57 AM  PHQ 2/9 Scores  PHQ - 2 Score 0 2 0 0 0 1 0  PHQ- 9 Score  5   0 5 3     Fall Risk    08/29/2022    8:56 AM 08/05/2022    8:53 AM 05/04/2022    2:54 PM 10/06/2021    9:23 AM 08/27/2021    9:07 AM  Fall Risk   Falls in the past year? 0 0 0 1 0  Number falls in past yr: 0 0 0 0 0  Injury with Fall? 0 0 0 1 0  Risk for fall due to : No Fall Risks No Fall Risks No Fall Risks History of fall(s) No Fall Risks  Follow up Falls evaluation completed;Education provided Falls evaluation completed Falls evaluation completed Falls evaluation completed Falls evaluation completed     Objective:  Gari Trovato seemed alert and oriented and she participated appropriately during our telephone visit.  Blood Pressure Weight BMI  BP Readings from Last 3 Encounters:  08/05/22 118/70  05/06/22 129/68  05/04/22 123/71   Wt Readings from Last 3 Encounters:  08/05/22 185 lb (83.9 kg)  05/06/22 187 lb 6 oz (85 kg)  05/04/22 186 lb (84.4 kg)   BMI Readings from Last 1 Encounters:  08/05/22 34.96 kg/m    *Unable to obtain current vital signs, weight, and BMI due to telephone visit type  Hearing/Vision  Laquida did not seem to have difficulty with hearing/understanding during the telephone conversation Reports that she has had a formal eye exam by an eye care professional within the past year Reports that she has not had a formal hearing evaluation within the past year *Unable to fully assess hearing and vision during telephone visit type  Cognitive Function:    08/29/2022    9:03 AM 08/27/2021     9:17 AM 05/26/2020    8:49 AM 01/21/2019   10:09 AM  6CIT Screen  What Year? 0 points 0 points 0 points 0 points  What month? 0 points 0 points 0 points 0 points  What time? 0 points 0 points 0 points 0 points  Count back from 20 0 points 0 points 0 points 0 points  Months in reverse 0 points 0 points 0 points 0 points  Repeat phrase 0 points 0 points 2 points 0 points  Total Score 0 points 0 points 2 points 0 points   (Normal:0-7, Significant for Dysfunction: >8)  Normal Cognitive Function Screening: Yes   Immunization & Health Maintenance Record Immunization History  Administered Date(s) Administered   Hep B, Unspecified 09/06/2017   Hepatitis B 09/06/2017   Influenza, High Dose Seasonal PF 05/15/2019, 05/08/2022   Influenza,inj,Quad PF,6+ Mos 04/11/2014   Influenza-Unspecified 06/09/2017, 05/18/2018, 04/25/2021   PFIZER(Purple Top)SARS-COV-2 Vaccination 08/28/2019, 09/18/2019, 04/24/2020   PPD Test 09/20/2013  Pension scheme manager 34yr & up 06/24/2021   Pneumococcal Polysaccharide-23 01/09/2019   Pneumococcal-Unspecified 09/06/2017   Td 09/20/2013   Td (Adult),5 Lf Tetanus Toxid, Preservative Free 09/20/2013   Tdap 08/08/2013, 12/26/2018   Unspecified SARS-COV-2 Vaccination 08/19/2019, 09/09/2019, 04/24/2020   Zoster Recombinat (Shingrix) 04/25/2021   Zoster, Live 05/16/2014, 11/07/2017    Health Maintenance  Topic Date Due   COVID-19 Vaccine (8 - 2023-24 season) 09/14/2022 (Originally 03/25/2022)   Zoster Vaccines- Shingrix (2 of 2) 11/04/2022 (Originally 06/20/2021)   Medicare Annual Wellness ([place]  08/30/2023   MAMMOGRAM  04/25/2024   COLONOSCOPY (Pts 45-419yrInsurance coverage will need to be confirmed)  05/23/2028   DTaP/Tdap/Td (5 - Td or Tdap) 12/25/2028   INFLUENZA VACCINE  Completed   DEXA SCAN  Completed   Hepatitis C Screening  Completed   HPV VACCINES  Aged Out   Pneumonia Vaccine 6578Years old  Discontinued       Assessment   This is a routine wellness examination for [employer] Health Maintenance: Due or Overdue There are no preventive care reminders to display for this patient.   Abigial SlSempra Energyoes not need a referral for CoFord Motor CompanyCare Management:   no Social Work:    no Prescription Assistance:  no Nutrition/Diabetes Education:  no   Plan:  Personalized Goals  Goals Addressed               This Visit's Progress     Patient Stated (pt-stated)        Patient stated that she would like to maintain losing 1 lb a week.        Personalized Health Maintenance & Screening Recommendations  2nd shingles vaccine- patient has already completed this; she will update usKoreaith the date.   Lung Cancer Screening Recommended: no (Low Dose CT Chest recommended if Age 71-80ears, 30 pack-year currently smoking OR have quit w/in past 15 years) Hepatitis C Screening recommended: no HIV Screening recommended: no  Advanced Directives: Written information was not prepared per patient's request.  Referrals & Orders No orders of the defined types were placed in this encounter.   Follow-up Plan Follow-up with BrDonella StadePA-C as planned Medicare wellness visit in one year.  Patient will access AVS on my chart.   I have personally reviewed and noted the following in the patient's chart:   Medical and social history Use of alcohol, tobacco or illicit drugs  Current medications and supplements Functional ability and status Nutritional status Physical activity Advanced directives List of other physicians Hospitalizations, surgeries, and ER visits in previous 12 months Vitals Screenings to include cognitive, depression, and falls Referrals and appointments  In addition, I have reviewed and discussed with DoVerdie Shireertain preventive protocols, quality metrics, and best practice recommendations. A written personalized care plan for preventive  services as well as general preventive health recommendations is available and can be mailed to the patient at her request.      BaTinnie GensRN BSN  08/29/2022

## 2022-08-29 NOTE — Patient Instructions (Signed)
[place] Maintenance Summary and Written Plan of Care  Janice Dudley ,  Thank you for allowing me to perform your Medicare Annual Wellness Visit and for your ongoing commitment to your health.   Health Maintenance & Immunization History Health Maintenance  Topic Date Due   COVID-19 Vaccine (8 - 2023-24 season) 09/14/2022 (Originally 03/25/2022)   Zoster Vaccines- Shingrix (2 of 2) 11/04/2022 (Originally 06/20/2021)   Medicare Annual Wellness ([place])  08/30/2023   MAMMOGRAM  04/25/2024   COLONOSCOPY (Pts 45-27yr Insurance coverage will need to be confirmed)  05/23/2028   DTaP/Tdap/Td (5 - Td or Tdap) 12/25/2028   INFLUENZA VACCINE  Completed   DEXA SCAN  Completed   Hepatitis C Screening  Completed   HPV VACCINES  Aged Out   Pneumonia Vaccine 72 Years old  Discontinued   Immunization History  Administered Date(s) Administered   Hep B, Unspecified 09/06/2017   Hepatitis B 09/06/2017   Influenza, High Dose Seasonal PF 05/15/2019, 05/08/2022   Influenza,inj,Quad PF,6+ Mos 04/11/2014   Influenza-Unspecified 06/09/2017, 05/18/2018, 04/25/2021   PFIZER(Purple Top)SARS-COV-2 Vaccination 08/28/2019, 09/18/2019, 04/24/2020   PPD Test 09/20/2013   Pfizer Covid-19 Vaccine Bivalent Booster 71yr& up 06/24/2021   Pneumococcal Polysaccharide-23 01/09/2019   Pneumococcal-Unspecified 09/06/2017   Td 09/20/2013   Td (Adult),5 Lf Tetanus Toxid, Preservative Free 09/20/2013   Tdap 08/08/2013, 12/26/2018   Unspecified SARS-COV-2 Vaccination 08/19/2019, 09/09/2019, 04/24/2020   Zoster Recombinat (Shingrix) 04/25/2021   Zoster, Live 05/16/2014, 11/07/2017    These are the patient goals that we discussed:  Goals Addressed               This Visit's Progress     Patient Stated (pt-stated)        Patient stated that she would like to maintain losing 1 lb a week.          This is a list of Health Maintenance Items that are overdue or due now: 2nd shingles  vaccine- patient has already completed this; she will update usKoreaith the date.     Orders/Referrals Placed Today: No orders of the defined types were placed in this encounter.  (Contact our referral department at [phone] you have not spoken with someone about your referral appointment within the next 5 days)    Follow-up Plan Follow-up with BrDonella StadePA-C as planned Medicare wellness visit in one year.  Patient will access AVS on my chart.      Health Maintenance, Female Adopting a healthy lifestyle and getting preventive care are important in promoting health and wellness. Ask your health care provider about: The right schedule for you to have regular tests and exams. Things you can do on your own to prevent diseases and keep yourself healthy. What should I know about diet, weight, and exercise? Eat a healthy diet  Eat a diet that includes plenty of vegetables, fruits, low-fat dairy products, and lean protein. Do not eat a lot of foods that are high in solid fats, added sugars, or sodium. Maintain a healthy weight Body mass index (BMI) is used to identify weight problems. It estimates body fat based on height and weight. Your health care provider can help determine your BMI and help you achieve or maintain a healthy weight. Get regular exercise Get regular exercise. This is one of the most important things you can do for your health. Most adults should: Exercise for at least 150 minutes each week. The exercise should increase your heart rate and  make you sweat (moderate-intensity exercise). Do strengthening exercises at least twice a week. This is in addition to the moderate-intensity exercise. Spend less time sitting. Even light physical activity can be beneficial. Watch cholesterol and blood lipids Have your blood tested for lipids and cholesterol at 71 years of age, then have this test every 5 years. Have your cholesterol levels checked more often if: Your  lipid or cholesterol levels are high. You are older than 71 years of age. You are at high risk for heart disease. What should I know about cancer screening? Depending on your health history and family history, you may need to have cancer screening at various ages. This may include screening for: Breast cancer. Cervical cancer. Colorectal cancer. Skin cancer. Lung cancer. What should I know about heart disease, diabetes, and high blood pressure? Blood pressure and heart disease High blood pressure causes heart disease and increases the risk of stroke. This is more likely to develop in people who have high blood pressure readings or are overweight. Have your blood pressure checked: Every 3-5 years if you are 71-29 years of age. Every year if you are 71 years old or older. Diabetes Have regular diabetes screenings. This checks your fasting blood sugar level. Have the screening done: Once every three years after age 71 if you are at a normal weight and have a low risk for diabetes. More often and at a younger age if you are overweight or have a high risk for diabetes. What should I know about preventing infection? Hepatitis B If you have a higher risk for hepatitis B, you should be screened for this virus. Talk with your health care provider to find out if you are at risk for hepatitis B infection. Hepatitis C Testing is recommended for: Everyone born from [birth year] through 1965. Anyone with known risk factors for hepatitis C. Sexually transmitted infections (STIs) Get screened for STIs, including gonorrhea and chlamydia, if: You are sexually active and are younger than 71 years of age. You are older than 71 years of age and your health care provider tells you that you are at risk for this type of infection. Your sexual activity has changed since you were last screened, and you are at increased risk for chlamydia or gonorrhea. Ask your health care provider if you are at risk. Ask your health  care provider about whether you are at high risk for HIV. Your health care provider may recommend a prescription medicine to help prevent HIV infection. If you choose to take medicine to prevent HIV, you should first get tested for HIV. You should then be tested every 3 months for as long as you are taking the medicine. Pregnancy If you are about to stop having your period (premenopausal) and you may become pregnant, seek counseling before you get pregnant. Take 400 to 800 micrograms (mcg) of folic acid every day if you become pregnant. Ask for birth control (contraception) if you want to prevent pregnancy. Osteoporosis and menopause Osteoporosis is a disease in which the bones lose minerals and strength with aging. This can result in bone fractures. If you are 62 years old or older, or if you are at risk for osteoporosis and fractures, ask your health care provider if you should: Be screened for bone loss. Take a calcium or vitamin D supplement to lower your risk of fractures. Be given hormone replacement therapy (HRT) to treat symptoms of menopause. Follow these instructions at home: Alcohol use Do not drink alcohol if: Your health  care provider tells you not to drink. You are pregnant, may be pregnant, or are planning to become pregnant. If you drink alcohol: Limit how much you have to: 0-1 drink a day. Know how much alcohol is in your drink. In the [place] one drink equals one 12 oz bottle of beer (355 mL), one 5 oz glass of wine (148 mL), or one 1 oz glass of hard liquor (44 mL). Lifestyle Do not use any products that contain nicotine or tobacco. These products include cigarettes, chewing tobacco, and vaping devices, such as e-cigarettes. If you need help quitting, ask your health care provider. Do not use street drugs. Do not share needles. Ask your health care provider for help if you need support or information about quitting drugs. General instructions Schedule regular health,  dental, and eye exams. Stay current with your vaccines. Tell your health care provider if: You often feel depressed. You have ever been abused or do not feel safe at home. Summary Adopting a healthy lifestyle and getting preventive care are important in promoting health and wellness. Follow your health care provider's instructions about healthy diet, exercising, and getting tested or screened for diseases. Follow your health care provider's instructions on monitoring your cholesterol and blood pressure. This information is not intended to replace advice given to you by your health care provider. Make sure you discuss any questions you have with your health care provider. Document Revised: 11/30/2020 Document Reviewed: 11/30/2020 Elsevier Patient Education  [place].

## 2022-09-05 ENCOUNTER — Ambulatory Visit (INDEPENDENT_AMBULATORY_CARE_PROVIDER_SITE_OTHER): Payer: Medicare PPO | Admitting: Sports Medicine

## 2022-09-05 ENCOUNTER — Ambulatory Visit (INDEPENDENT_AMBULATORY_CARE_PROVIDER_SITE_OTHER): Payer: Medicare PPO

## 2022-09-05 DIAGNOSIS — M170 Bilateral primary osteoarthritis of knee: Secondary | ICD-10-CM | POA: Diagnosis not present

## 2022-09-05 DIAGNOSIS — M65332 Trigger finger, left middle finger: Secondary | ICD-10-CM

## 2022-09-05 MED ORDER — TRIAMCINOLONE ACETONIDE 40 MG/ML IJ SUSP
80.0000 mg | Freq: Once | INTRAMUSCULAR | Status: AC
  Administered 2022-09-05: 80 mg via INTRAMUSCULAR

## 2022-09-05 NOTE — Assessment & Plan Note (Signed)
Laurita did well with a knee injection back in September of last year, she is having recurrence of pain so we did repeat bilateral knee injections today, return as needed.

## 2022-09-05 NOTE — Progress Notes (Signed)
    Procedures performed today:    Procedure: Real-time Ultrasound Guided injection of the left knee Device: Samsung HS60  Verbal informed consent obtained.  Time-out conducted.  Noted no overlying erythema, induration, or other signs of local infection.  Skin prepped in a sterile fashion.  Local anesthesia: Topical Ethyl chloride.  With sterile technique and under real time ultrasound guidance: Moderate effusion noted, 1 cc Kenalog 40, 2 cc lidocaine, 2 cc bupivacaine injected easily Completed without difficulty  Advised to call if fevers/chills, erythema, induration, drainage, or persistent bleeding.  Images permanently stored and available for review in PACS.  Impression: Technically successful ultrasound guided injection.   Procedure: Real-time Ultrasound Guided injection of the right knee Device: Samsung HS60  Verbal informed consent obtained.  Time-out conducted.  Noted no overlying erythema, induration, or other signs of local infection.  Skin prepped in a sterile fashion.  Local anesthesia: Topical Ethyl chloride.  With sterile technique and under real time ultrasound guidance: Moderate effusion noted, 1 cc Kenalog 40, 2 cc lidocaine, 2 cc bupivacaine injected easily Completed without difficulty  Advised to call if fevers/chills, erythema, induration, drainage, or persistent bleeding.  Images permanently stored and available for review in PACS.  Impression: Technically successful ultrasound guided injection.  Independent interpretation of notes and tests performed by another provider:   None.  Brief History, Exam, Impression, and Recommendations:    Primary osteoarthritis of both knees Janice Dudley did well with a knee injection back in September of last year, she is having recurrence of pain so we did repeat bilateral knee injections today, return as needed.  Trigger finger, left middle finger Continue splint, added trigger finger exercises, return to see me if not better  in 4 to 6 weeks for trigger finger injection.    ____________________________________________ [name]. [name], M.D., ABFM., CAQSM., AME. Primary Care and Sports Medicine [facility]  Adjunct Professor of [employer]  Risk manager

## 2022-09-05 NOTE — Assessment & Plan Note (Signed)
Continue splint, added trigger finger exercises, return to see me if not better in 4 to 6 weeks for trigger finger injection.

## 2022-09-05 NOTE — Addendum Note (Signed)
Addended by: Tarri Glenn A on: 09/05/2022 04:57 PM   Modules accepted: Orders

## 2022-09-09 ENCOUNTER — Other Ambulatory Visit: Payer: Self-pay | Admitting: Physician Assistant

## 2022-09-09 DIAGNOSIS — L719 Rosacea, unspecified: Secondary | ICD-10-CM

## 2022-09-15 ENCOUNTER — Other Ambulatory Visit: Payer: Self-pay | Admitting: *Deleted

## 2022-09-15 MED ORDER — ANASTROZOLE 1 MG PO TABS: 1.0000 mg | ORAL_TABLET | Freq: Every day | ORAL | 4 refills | Status: DC

## 2022-09-26 ENCOUNTER — Other Ambulatory Visit: Payer: Self-pay | Admitting: Neurology

## 2022-09-26 DIAGNOSIS — E6609 Other obesity due to excess calories: Secondary | ICD-10-CM

## 2022-09-26 MED ORDER — BUPROPION HCL ER (SR) 150 MG PO TB12: 150.0000 mg | ORAL_TABLET | Freq: Two times a day (BID) | ORAL | 3 refills | Status: DC

## 2022-11-01 ENCOUNTER — Ambulatory Visit (INDEPENDENT_AMBULATORY_CARE_PROVIDER_SITE_OTHER): Payer: Medicare PPO | Admitting: Physician Assistant

## 2022-11-01 VITALS — BP 131/67 | HR 75 | Ht 64.0 in | Wt 184.0 lb

## 2022-11-01 DIAGNOSIS — G3184 Mild cognitive impairment, so stated: Secondary | ICD-10-CM | POA: Diagnosis not present

## 2022-11-01 DIAGNOSIS — R4586 Emotional lability: Secondary | ICD-10-CM

## 2022-11-01 DIAGNOSIS — R413 Other amnesia: Secondary | ICD-10-CM

## 2022-11-01 NOTE — Patient Instructions (Signed)
Will get labs.  Will get MRI.  Consider starting aricept.   Donepezil Tablets What is this medication? DONEPEZIL (doe NEP e zil) treats memory loss and confusion (dementia) in people who have Alzheimer disease. It works by improving attention, memory, and the ability to engage in daily activities. It is not a cure for dementia or Alzheimer disease. This medicine may be used for other purposes; ask your health care provider or pharmacist if you have questions. COMMON BRAND NAME(S): Aricept What should I tell my care team before I take this medication? They need to know if you have any of these conditions: Asthma or other lung disease Difficulty passing urine Head injury Heart disease History of irregular heartbeat Liver disease Seizures (convulsions) Stomach or intestinal disease, ulcers or stomach bleeding An unusual or allergic reaction to donepezil, other medications, foods, dyes, or preservatives Pregnant or trying to get pregnant Breast-feeding How should I use this medication? Take this medication by mouth with a glass of water. Follow the directions on the prescription label. You may take this medication with or without food. Take this medication at regular intervals. This medication is usually taken before bedtime. Do not take it more often than directed. Continue to take your medication even if you feel better. Do not stop taking except on your care team's advice. If you are taking the 23 mg donepezil tablet, swallow it whole; do not cut, crush, or chew it. Talk to your care team about the use of this medication in children. Special care may be needed. Overdosage: If you think you have taken too much of this medicine contact a poison control center or emergency room at once. NOTE: This medicine is only for you. Do not share this medicine with others. What if I miss a dose? If you miss a dose, take it as soon as you can. If it is almost time for your next dose, take only that  dose, do not take double or extra doses. What may interact with this medication? Do not take this medication with any of the following: Certain medications for fungal infections like itraconazole, fluconazole, posaconazole, and voriconazole Cisapride Dextromethorphan; quinidine Dronedarone Pimozide Quinidine Thioridazine This medication may also interact with the following: Antihistamines for allergy, cough and cold Atropine Bethanechol Carbamazepine Certain medications for bladder problems like oxybutynin, tolterodine Certain medications for Parkinson's disease like benztropine, trihexyphenidyl Certain medications for stomach problems like dicyclomine, hyoscyamine Certain medications for travel sickness like scopolamine Dexamethasone Dofetilide Ipratropium NSAIDs, medications for pain and inflammation, like ibuprofen or naproxen Other medications for Alzheimer's disease Other medications that prolong the QT interval (cause an abnormal heart rhythm) Phenobarbital Phenytoin Rifampin, rifabutin or rifapentine Ziprasidone This list may not describe all possible interactions. Give your health care provider a list of all the medicines, herbs, non-prescription drugs, or dietary supplements you use. Also tell them if you smoke, drink alcohol, or use illegal drugs. Some items may interact with your medicine. What should I watch for while using this medication? Visit your care team for regular checks on your progress. Check with your care team if your symptoms do not get better or if they get worse. You may get drowsy or dizzy. Do not drive, use machinery, or do anything that needs mental alertness until you know how this medication affects you. What side effects may I notice from receiving this medication? Side effects that you should report to your care team as soon as possible: Allergic reactions--skin rash, itching, hives, swelling of the face, lips,  tongue, or throat Peptic  ulcer--burning stomach pain, loss of appetite, bloating, burping, heartburn, nausea, vomiting Seizures Slow heartbeat--dizziness, feeling faint or lightheaded, confusion, trouble breathing, unusual weakness or fatigue Stomach bleeding--bloody or black, tar-like stools, vomiting blood or brown material that looks like coffee grounds Trouble passing urine Side effects that usually do not require medical attention (report these to your care team if they continue or are bothersome): Diarrhea Fatigue Loss of appetite Muscle pain or cramps Nausea Trouble sleeping This list may not describe all possible side effects. Call your doctor for medical advice about side effects. You may report side effects to FDA at 1-800-FDA-1088. Where should I keep my medication? Keep out of reach of children. Store at room temperature between 15 and 30 degrees C (59 and 86 degrees F). Throw away any unused medication after the expiration date. NOTE: This sheet is a summary. It may not cover all possible information. If you have questions about this medicine, talk to your doctor, pharmacist, or health care provider.  2023 Elsevier/Gold Standard (2021-01-28 00:00:00)

## 2022-11-01 NOTE — Progress Notes (Signed)
Acute Office Visit  Subjective:     Patient ID: Janice Dudley, female    DOB: 02-Mar-1951, 72 y.o.   MRN: [id number]  No chief complaint on file.   HPI Patient is in today for to discuss memory changes. She is accompanied by her husband. They had a conversation when they were on vacation and both agreed that her memory and mood had changed over the past 6 months to a year. She has not forgotten people but gets easily confused. She gets irritable when this happens as well. She has become scared to leave the house or do things without her husband or someone else. She forgets how to do routine or daily things.    .. Family History  Problem Relation Age of Onset   Stroke Mother    Heart attack Mother 59   Cancer Mother        Uterine   Breast cancer Mother 28   Heart attack Father 56   Cancer Father        Lung   Stroke Father    Hyperlipidemia Sister    Breast cancer Sister 16   Cancer Sister    Hyperlipidemia Sister    Thyroid nodules Brother    Hyperlipidemia Brother    Prostate cancer Brother 67   Cancer Brother    Hyperlipidemia Brother    Lung cancer Brother 60   Thyroid cancer Brother        dx. early 6s   Cancer Brother    Hyperlipidemia Brother    Anxiety disorder Daughter    Depression Daughter    Colon cancer Neg Hx    Esophageal cancer Neg Hx    Rectal cancer Neg Hx      ROS See HPI.      Objective:    BP 131/67   Pulse 75   Ht 5\' 4"  (1.626 m)   Wt 184 lb (83.5 kg)   SpO2 99%   BMI 31.58 kg/m  BP Readings from Last 3 Encounters:  11/01/22 131/67  08/05/22 118/70  05/06/22 129/68   Wt Readings from Last 3 Encounters:  11/01/22 184 lb (83.5 kg)  08/05/22 185 lb (83.9 kg)  05/06/22 187 lb 6 oz (85 kg)    ..    08/29/2022    8:56 AM 08/05/2022    9:14 AM 05/04/2022    2:54 PM 10/06/2021    9:23 AM 08/27/2021    9:07 AM  Depression screen PHQ 2/9  Decreased Interest 0 1 0 0 0  Down, Depressed, Hopeless 0 1 0 0 0  PHQ - 2  Score 0 2 0 0 0  Altered sleeping  1   0  Tired, decreased energy  1   0  Change in appetite  1   0  Feeling bad or failure about yourself   0   0  Trouble concentrating  0   0  Moving slowly or fidgety/restless  0   0  Suicidal thoughts  0   0  PHQ-9 Score  5   0  Difficult doing work/chores  Not difficult at all   Not difficult at all   ..    08/05/2022    9:15 AM 06/30/2021   11:13 AM 12/25/2020    9:58 AM 05/26/2020    9:45 AM  GAD 7 : Generalized Anxiety Score  Nervous, Anxious, on Edge 2 0 0 0  Control/stop worrying 2 0 1 0  Worry too much -  different things 0 0 0 0  Trouble relaxing 0 0 0 0  Restless 0 0 0 0  Easily annoyed or irritable 1 1 0 0  Afraid - awful might happen 0 0 0 0  Total GAD 7 Score 5 1 1  0  Anxiety Difficulty Not difficult at all Not difficult at all Not difficult at all Not difficult at all        11/01/2022    2:03 PM  [facility] Cognitive Assessment   Visuospatial/ Executive (0/5) 4  Naming (0/3) 3  Attention: Read list of digits (0/2) 1  Attention: Read list of letters (0/1) 1  Attention: Serial 7 subtraction starting at 100 (0/3) 3  Language: Repeat phrase (0/2) 1  Language : Fluency (0/1) 0  Abstraction (0/2) 1  Delayed Recall (0/5) 4  Orientation (0/6) 6  Total 24  Adjusted Score (based on education) 24     Physical Exam Constitutional:      Appearance: Normal appearance. She is obese.  HENT:     Head: Normocephalic.  Cardiovascular:     Rate and Rhythm: Normal rate.  Pulmonary:     Effort: Pulmonary effort is normal.     Breath sounds: Normal breath sounds.  Musculoskeletal:     Cervical back: Normal range of motion and neck supple.     Right lower leg: No edema.     Left lower leg: No edema.  Neurological:     General: No focal deficit present.     Mental Status: She is alert and oriented to person, place, and time.  Psychiatric:        Mood and Affect: Mood normal.          Assessment & Plan:  Marland KitchenMarland KitchenDiagnoses and all  orders for this visit:  Mild cognitive impairment -     MR Brain W Wo Contrast; Future  Memory changes -     RPR -     Vitamin B12 -     Sedimentation rate -     CBC -     TSH -     Folate -     COMPLETE METABOLIC PANEL WITH GFR -     MR Brain W Wo Contrast; Future  Mood changes -     MR Brain W Wo Contrast; Future   Labs ordered for memory Moca shows some mild cognitive declined, see above I do not think depression is responsible Consider starting aricept Work on keep brain active Will get MRI Follow up as needed or sooner if symptoms persist or worsen Could consider neurologist referral  Tandy Gaw, PA-C

## 2022-11-02 LAB — CBC
HCT: 40.7 % (ref 35.0–45.0)
Hemoglobin: 13.5 g/dL (ref 11.7–15.5)
MCH: 31.6 pg (ref 27.0–33.0)
MCHC: 33.2 g/dL (ref 32.0–36.0)
MCV: 95.3 fL (ref 80.0–100.0)
MPV: 10.2 fL (ref 7.5–12.5)
Platelets: 337 10*3/uL (ref 140–400)
RBC: 4.27 10*6/uL (ref 3.80–5.10)
RDW: 13 % (ref 11.0–15.0)
WBC: 6.5 10*3/uL (ref 3.8–10.8)

## 2022-11-02 LAB — SEDIMENTATION RATE: Sed Rate: 19 mm/h (ref 0–30)

## 2022-11-02 LAB — COMPLETE METABOLIC PANEL WITH GFR
AG Ratio: 1.6 (calc) (ref 1.0–2.5)
ALT: 17 U/L (ref 6–29)
AST: 21 U/L (ref 10–35)
Albumin: 4.3 g/dL (ref 3.6–5.1)
Alkaline phosphatase (APISO): 109 U/L (ref 37–153)
BUN: 15 mg/dL (ref 7–25)
CO2: 26 mmol/L (ref 20–32)
Calcium: 10.1 mg/dL (ref 8.6–10.4)
Chloride: 99 mmol/L (ref 98–110)
Creat: 0.89 mg/dL (ref 0.60–1.00)
Globulin: 2.7 g/dL (calc) (ref 1.9–3.7)
Glucose, Bld: 84 mg/dL (ref 65–99)
Potassium: 4.3 mmol/L (ref 3.5–5.3)
Sodium: 138 mmol/L (ref 135–146)
Total Bilirubin: 0.5 mg/dL (ref 0.2–1.2)
Total Protein: 7 g/dL (ref 6.1–8.1)
eGFR: 70 mL/min/{1.73_m2} (ref >=60)

## 2022-11-02 LAB — FOLATE: Folate: 16.7 ng/mL

## 2022-11-02 LAB — TSH: TSH: 3.4 mIU/L (ref 0.40–4.50)

## 2022-11-02 LAB — RPR: RPR Ser Ql: NONREACTIVE

## 2022-11-02 LAB — VITAMIN B12: Vitamin B-12: 626 pg/mL (ref 200–1100)

## 2022-11-03 ENCOUNTER — Encounter: Payer: Self-pay | Admitting: Physician Assistant

## 2022-11-03 NOTE — Progress Notes (Signed)
Overall labs look pretty good! Thyroid is stable and in normal range. No concerns with labs.

## 2022-11-04 MED ORDER — VORTIOXETINE HBR 5 MG PO TABS: 5.0000 mg | ORAL_TABLET | Freq: Every day | ORAL | 1 refills | Status: DC

## 2022-11-18 ENCOUNTER — Encounter: Payer: Self-pay | Admitting: Physician Assistant

## 2022-11-18 DIAGNOSIS — R413 Other amnesia: Secondary | ICD-10-CM | POA: Insufficient documentation

## 2022-11-18 DIAGNOSIS — G3184 Mild cognitive impairment, so stated: Secondary | ICD-10-CM | POA: Insufficient documentation

## 2022-11-18 DIAGNOSIS — R4586 Emotional lability: Secondary | ICD-10-CM | POA: Insufficient documentation

## 2022-11-18 DIAGNOSIS — F03A11 Unspecified dementia, mild, with agitation: Secondary | ICD-10-CM | POA: Insufficient documentation

## 2022-12-08 ENCOUNTER — Encounter: Payer: Self-pay | Admitting: Physician Assistant

## 2023-01-02 NOTE — Telephone Encounter (Signed)
New auth obtained, ref # [id number]. Valid from 12/30/22 through 01/29/23. Imaging dept notified.

## 2023-01-15 ENCOUNTER — Encounter: Payer: Self-pay | Admitting: Physician Assistant

## 2023-01-23 ENCOUNTER — Ambulatory Visit (INDEPENDENT_AMBULATORY_CARE_PROVIDER_SITE_OTHER): Payer: Medicare PPO

## 2023-01-23 DIAGNOSIS — R4189 Other symptoms and signs involving cognitive functions and awareness: Secondary | ICD-10-CM | POA: Diagnosis not present

## 2023-01-23 DIAGNOSIS — G3184 Mild cognitive impairment, so stated: Secondary | ICD-10-CM

## 2023-01-23 DIAGNOSIS — R4586 Emotional lability: Secondary | ICD-10-CM | POA: Diagnosis not present

## 2023-01-23 DIAGNOSIS — G319 Degenerative disease of nervous system, unspecified: Secondary | ICD-10-CM | POA: Diagnosis not present

## 2023-01-23 DIAGNOSIS — R413 Other amnesia: Secondary | ICD-10-CM

## 2023-01-23 DIAGNOSIS — C50919 Malignant neoplasm of unspecified site of unspecified female breast: Secondary | ICD-10-CM | POA: Diagnosis not present

## 2023-01-23 DIAGNOSIS — R4182 Altered mental status, unspecified: Secondary | ICD-10-CM | POA: Diagnosis not present

## 2023-01-23 MED ORDER — GADOBUTROL 1 MMOL/ML IV SOLN
7.5000 mL | Freq: Once | INTRAVENOUS | Status: AC | PRN
Start: 2023-01-23 — End: 2023-01-23
  Administered 2023-01-23: 7.5 mL via INTRAVENOUS

## 2023-01-24 ENCOUNTER — Encounter: Payer: Self-pay | Admitting: Physician Assistant

## 2023-01-24 ENCOUNTER — Other Ambulatory Visit: Payer: Self-pay | Admitting: Physician Assistant

## 2023-01-24 DIAGNOSIS — L719 Rosacea, unspecified: Secondary | ICD-10-CM

## 2023-01-24 DIAGNOSIS — G319 Degenerative disease of nervous system, unspecified: Secondary | ICD-10-CM | POA: Insufficient documentation

## 2023-01-24 NOTE — Progress Notes (Signed)
No acute cranial abnormality.  You do have mild generalized cerebral atrophy. Follow up virtual to discuss plan moving forward.

## 2023-01-31 ENCOUNTER — Encounter: Payer: Self-pay | Admitting: Physician Assistant

## 2023-01-31 ENCOUNTER — Telehealth (INDEPENDENT_AMBULATORY_CARE_PROVIDER_SITE_OTHER): Payer: Medicare PPO | Admitting: Physician Assistant

## 2023-01-31 DIAGNOSIS — R4586 Emotional lability: Secondary | ICD-10-CM

## 2023-01-31 DIAGNOSIS — G319 Degenerative disease of nervous system, unspecified: Secondary | ICD-10-CM

## 2023-01-31 DIAGNOSIS — F419 Anxiety disorder, unspecified: Secondary | ICD-10-CM | POA: Diagnosis not present

## 2023-01-31 DIAGNOSIS — Z818 Family history of other mental and behavioral disorders: Secondary | ICD-10-CM

## 2023-01-31 DIAGNOSIS — R4589 Other symptoms and signs involving emotional state: Secondary | ICD-10-CM | POA: Insufficient documentation

## 2023-01-31 DIAGNOSIS — G3184 Mild cognitive impairment, so stated: Secondary | ICD-10-CM

## 2023-01-31 HISTORY — DX: Anxiety disorder, unspecified: F41.9

## 2023-01-31 MED ORDER — VORTIOXETINE HBR 5 MG PO TABS: 5.0000 mg | ORAL_TABLET | Freq: Every day | ORAL | 0 refills | Status: DC

## 2023-01-31 MED ORDER — DONEPEZIL HCL 5 MG PO TBDP
5.0000 mg | ORAL_TABLET | Freq: Every day | ORAL | 0 refills | Status: DC
Start: 2023-01-31 — End: 2023-02-28

## 2023-01-31 NOTE — Progress Notes (Signed)
..Virtual Visit via Video Note  I connected with Janice Dudley on 01/31/23 at  2:00 PM EDT by a video enabled telemedicine application and verified that I am speaking with the correct person using two identifiers.  Location: Patient: home Provider: Clinic  .Marland KitchenParticipating in visit:  Patient: Janice Dudley the patient and her husband, Jonny Ruiz.  Provider:Gwendolen Hewlett PA-C   I discussed the limitations of evaluation and management by telemedicine and the availability of in person appointments. The patient expressed understanding and agreed to proceed.  History of Present Illness: Pt is a 72 yo female who connects via video with her husband to discuss MRI of brain and memory concerns.   MRI results below.    IMPRESSION: 1.  No evidence of an acute intracranial abnormality. 2. Partially empty sella turcica. This finding can reflect incidental anatomic variation, or alternatively, it can be associated with idiopathic intracranial hypertension (pseudotumor cerebri). 3. Mild generalized cerebral atrophy. 4. Incompletely assessed cervical spondylosis.  She did start trintellix and stayed on it 2 months. Not sure why she did not pick up refills. She and husband felt like she was doing better on it. In the last few weeks she is more easily frustrated and paranoid at times. She has good and bad days her symptoms are very episodic. She struggled counting money the other day and had to ask for help. She is still very nervous to drive. She can just have episodes where she feels very confused. Denies any headaches. She does have some blurred vision and has visit with eye doctor. No dizziness. She and husband have been traveling a lot and that allows her to be more active.   .. Active Ambulatory Problems    Diagnosis Date Noted   Hyperlipidemia    Fibrocystic breast 03/25/2014   Hypertriglyceridemia 04/15/2014   Obesity 04/15/2014   Abnormal weight gain 05/16/2014   Atypical pigmented skin  lesion 03/02/2016   Post-menopausal 03/02/2016   No energy 06/22/2017   Trouble in sleeping 06/22/2017   Strain of abdominal muscle 09/28/2017   H/O osteopenia 11/29/2017   Left breast mass 01/14/2019   Hypotension 01/14/2019   Laceration of right lower extremity 01/14/2019   Mixed hyperlipidemia 05/26/2020   Perceived hearing changes 05/26/2020   Primary osteoarthritis of both knees 05/27/2020   Elevated liver enzymes 12/28/2020   Malignant neoplasm of lower-outer quadrant of right breast of female, estrogen receptor positive (HCC) 04/23/2021   Family history of breast cancer 04/29/2021   Family history of uterine cancer 04/29/2021   Family history of prostate cancer 04/29/2021   Genetic testing 05/10/2021   Lower extremity edema 07/02/2021   Seborrheic dermatitis 07/02/2021   Malignant tumor of breast (HCC) 04/26/2021   Vertigo 05/04/2022   Jaw pain 05/04/2022   Trigger finger, left middle finger 09/05/2022   Mood changes 11/18/2022   Memory changes 11/18/2022   Mild cognitive impairment 11/18/2022   Cerebral atrophy, mild (HCC) 01/24/2023   Anxiousness 01/31/2023   Depressed mood 01/31/2023   Family history of dementia 01/31/2023   Resolved Ambulatory Problems    Diagnosis Date Noted   Sprain of ankle 05/16/2014   Obesity (BMI 30.0-34.9) 06/22/2017   Past Medical History:  Diagnosis Date   Allergy 1960s, 1970s, 2000   Arthritis    Breast cancer (HCC)    Cataract    Personal history of radiation therapy      Observations/Objective: No acute distress Husband accompanies her to virtual visit Normal mood and cognition and understanding are good  today  ..    01/31/2023    1:38 PM 08/29/2022    8:56 AM 08/05/2022    9:14 AM 05/04/2022    2:54 PM 10/06/2021    9:23 AM  Depression screen PHQ 2/9  Decreased Interest 0 0 1 0 0  Down, Depressed, Hopeless 0 0 1 0 0  PHQ - 2 Score 0 0 2 0 0  Altered sleeping 1  1    Tired, decreased energy   1    Change in appetite 1   1    Feeling bad or failure about yourself  0  0    Trouble concentrating 1  0    Moving slowly or fidgety/restless 0  0    Suicidal thoughts 0  0    PHQ-9 Score 3  5    Difficult doing work/chores   Not difficult at all     ..    01/31/2023    1:39 PM 08/05/2022    9:15 AM 06/30/2021   11:13 AM 12/25/2020    9:58 AM  GAD 7 : Generalized Anxiety Score  Nervous, Anxious, on Edge 1 2 0 0  Control/stop worrying 0 2 0 1  Worry too much - different things 0 0 0 0  Trouble relaxing 0 0 0 0  Restless 0 0 0 0  Easily annoyed or irritable 1 1 1  0  Afraid - awful might happen 0 0 0 0  Total GAD 7 Score 2 5 1 1   Anxiety Difficulty Not difficult at all Not difficult at all Not difficult at all Not difficult at all       ..    01/31/2023    1:49 PM 11/01/2022    2:03 PM  Montreal Cognitive Assessment   Visuospatial/ Executive (0/5) 0 4  Naming (0/3) 3 3  Attention: Read list of digits (0/2) 2 1  Attention: Read list of letters (0/1) 1 1  Attention: Serial 7 subtraction starting at 100 (0/3) 3 3  Language: Repeat phrase (0/2) 2 1  Language : Fluency (0/1) 1 0  Abstraction (0/2) 2 1  Delayed Recall (0/5) 5 4  Orientation (0/6) 6 6  Total 25 24  Adjusted Score (based on education) 25 24     Assessment and Plan: Marland KitchenMarland KitchenElba was seen today for follow-up.  Diagnoses and all orders for this visit:  Cerebral atrophy, mild (HCC) -     donepezil (ARICEPT ODT) 5 MG disintegrating tablet; Take 1 tablet (5 mg total) by mouth at bedtime. -     Ambulatory referral to Neurology  Mild cognitive impairment -     donepezil (ARICEPT ODT) 5 MG disintegrating tablet; Take 1 tablet (5 mg total) by mouth at bedtime. -     vortioxetine HBr (TRINTELLIX) 5 MG TABS tablet; Take 1 tablet (5 mg total) by mouth daily. -     Ambulatory referral to Neurology  Mood changes -     donepezil (ARICEPT ODT) 5 MG disintegrating tablet; Take 1 tablet (5 mg total) by mouth at bedtime. -     vortioxetine HBr  (TRINTELLIX) 5 MG TABS tablet; Take 1 tablet (5 mg total) by mouth daily. -     Ambulatory referral to Neurology  Depressed mood -     vortioxetine HBr (TRINTELLIX) 5 MG TABS tablet; Take 1 tablet (5 mg total) by mouth daily. -     Ambulatory referral to Neurology  Anxiousness -     vortioxetine HBr (TRINTELLIX) 5 MG  TABS tablet; Take 1 tablet (5 mg total) by mouth daily. -     Ambulatory referral to Neurology  Family history of dementia   PHQ and GAD improved as well as MOCA, which is reassuring; however, patient and husband reporting concerns with behavior/mood/cognition Husband and patient both noticed felt better and memory was better on trintellix She agrees to go back on it, restart trintellix 5mg  daily Discussed MRI and brain atrophy Agrees to start aricept, discussed side effects Referral made to neurology Follow up in 3 months    Follow Up Instructions:    I discussed the assessment and treatment plan with the patient. The patient was provided an opportunity to ask questions and all were answered. The patient agreed with the plan and demonstrated an understanding of the instructions.   The patient was advised to call back or seek an in-person evaluation if the symptoms worsen or if the condition fails to improve as anticipated.    Tandy Gaw, PA-C

## 2023-02-10 ENCOUNTER — Other Ambulatory Visit: Payer: Self-pay

## 2023-02-10 ENCOUNTER — Ambulatory Visit (INDEPENDENT_AMBULATORY_CARE_PROVIDER_SITE_OTHER): Payer: Medicare PPO | Admitting: Sports Medicine

## 2023-02-10 DIAGNOSIS — M170 Bilateral primary osteoarthritis of knee: Secondary | ICD-10-CM | POA: Diagnosis not present

## 2023-02-10 NOTE — Progress Notes (Signed)
    Procedures performed today:    Procedure: Real-time Ultrasound Guided injection of the left knee Device: Samsung HS60  Verbal informed consent obtained.  Time-out conducted.  Noted no overlying erythema, induration, or other signs of local infection.  Skin prepped in a sterile fashion.  Local anesthesia: Topical Ethyl chloride.  With sterile technique and under real time ultrasound guidance: Moderate effusion noted, 1 cc Kenalog 40, 2 cc lidocaine, 2 cc bupivacaine injected easily Completed without difficulty  Advised to call if fevers/chills, erythema, induration, drainage, or persistent bleeding.  Images permanently stored and available for review in PACS.  Impression: Technically successful ultrasound guided injection.   Procedure: Real-time Ultrasound Guided injection of the right knee Device: Samsung HS60  Verbal informed consent obtained.  Time-out conducted.  Noted no overlying erythema, induration, or other signs of local infection.  Skin prepped in a sterile fashion.  Local anesthesia: Topical Ethyl chloride.  With sterile technique and under real time ultrasound guidance: Moderate effusion noted, 1 cc Kenalog 40, 2 cc lidocaine, 2 cc bupivacaine injected easily Completed without difficulty  Advised to call if fevers/chills, erythema, induration, drainage, or persistent bleeding.  Images permanently stored and available for review in PACS.  Impression: Technically successful ultrasound guided injection.  Independent interpretation of notes and tests performed by another provider:   None.  Brief History, Exam, Impression, and Recommendations:    Primary osteoarthritis of both knees [name] did well after her injection in Feb.  Recurrence of pain, repeat bilateral injections today, return as needed.    ____________________________________________ Ihor Austin. Benjamin Stain, M.D., ABFM., CAQSM., AME. Primary Care and Sports Medicine [facility]  [facility]  Adjunct Professor of Family Medicine  [employer]  Restaurant manager, fast food

## 2023-02-10 NOTE — Assessment & Plan Note (Signed)
Janice Dudley did well after her injection in Feb.  Recurrence of pain, repeat bilateral injections today, return as needed.

## 2023-02-13 DIAGNOSIS — M170 Bilateral primary osteoarthritis of knee: Secondary | ICD-10-CM | POA: Diagnosis not present

## 2023-02-13 MED ORDER — TRIAMCINOLONE ACETONIDE 40 MG/ML IJ SUSP
40.0000 mg | Freq: Once | INTRAMUSCULAR | Status: AC
Start: 2023-02-13 — End: 2023-02-13
  Administered 2023-02-13: 40 mg via INTRAMUSCULAR

## 2023-02-13 MED ORDER — TRIAMCINOLONE ACETONIDE 40 MG/ML IJ SUSP
40.0000 mg | Freq: Once | INTRAMUSCULAR | Status: AC
  Administered 2023-02-13: 40 mg via INTRAMUSCULAR

## 2023-02-13 NOTE — Addendum Note (Signed)
Addended by: Carren Rang A on: 02/13/2023 01:29 PM   Modules accepted: Orders

## 2023-02-27 ENCOUNTER — Other Ambulatory Visit: Payer: Self-pay | Admitting: Physician Assistant

## 2023-02-27 DIAGNOSIS — R4586 Emotional lability: Secondary | ICD-10-CM

## 2023-02-27 DIAGNOSIS — G3184 Mild cognitive impairment, so stated: Secondary | ICD-10-CM

## 2023-02-27 DIAGNOSIS — G319 Degenerative disease of nervous system, unspecified: Secondary | ICD-10-CM

## 2023-04-05 DIAGNOSIS — H43821 Vitreomacular adhesion, right eye: Secondary | ICD-10-CM | POA: Diagnosis not present

## 2023-04-06 ENCOUNTER — Other Ambulatory Visit: Payer: Self-pay | Admitting: Hematology and Oncology

## 2023-04-25 DIAGNOSIS — Z01419 Encounter for gynecological examination (general) (routine) without abnormal findings: Secondary | ICD-10-CM | POA: Diagnosis not present

## 2023-04-25 DIAGNOSIS — Z6834 Body mass index (BMI) 34.0-34.9, adult: Secondary | ICD-10-CM | POA: Diagnosis not present

## 2023-05-01 ENCOUNTER — Ambulatory Visit: Payer: Medicare PPO | Admitting: Neurology

## 2023-05-01 ENCOUNTER — Encounter: Payer: Self-pay | Admitting: Neurology

## 2023-05-01 VITALS — BP 131/78 | HR 78 | Ht 61.0 in | Wt 188.0 lb

## 2023-05-01 DIAGNOSIS — Z9189 Other specified personal risk factors, not elsewhere classified: Secondary | ICD-10-CM

## 2023-05-01 DIAGNOSIS — Z853 Personal history of malignant neoplasm of breast: Secondary | ICD-10-CM | POA: Diagnosis not present

## 2023-05-01 DIAGNOSIS — R413 Other amnesia: Secondary | ICD-10-CM | POA: Diagnosis not present

## 2023-05-01 DIAGNOSIS — Z818 Family history of other mental and behavioral disorders: Secondary | ICD-10-CM

## 2023-05-01 DIAGNOSIS — M2619 Other specified anomalies of jaw-cranial base relationship: Secondary | ICD-10-CM

## 2023-05-01 MED ORDER — DONEPEZIL HCL 10 MG PO TABS: 10.0000 mg | ORAL_TABLET | Freq: Every day | ORAL | 5 refills | Status: DC

## 2023-05-01 NOTE — Progress Notes (Signed)
[facility]  Provider:  Dr Chenika Nevils Referring Provider: Nolene Ebbs Primary Care Physician:  Jomarie Longs, PA-C  Chief Complaint  Patient presents with   New Patient (Initial Visit)    RM, here with husband Jonny Ruiz Pt is here for mild cognitive impairment. Pt states she is forgetting do things, she is forgetting things, names, appointments, and forgetting to turn off the stove while leaving the house.     HPI:  Janice Dudley is a 72 y.o. female and seen here upon referral from Dr. Caleen Essex for a Consultation/ Evaluation of memory- loss.  This patient reports gradual onset of memory loss for STM over a period of over 12 months, likely 2 .  Her husband reports her to be more withdrawn , quiet, and argumentative. She is often agitated when she is reminded of a fact she forgot. She sometimes stares off, seems absent minded.  She sadly is no longer able to read a whole book and reports her focus is poor.  She couldn't keep up with a book her book club decided to read , and this book had 30 characters.  She is not trusting herself to drive longer distances, for [place] . She got lost on a familiar road and was worried.  Now is unwilling and scared to drive without her husband.   For the first time in 2 years she  scored high on a depression scale PHQ 2 and PHQ 9 in January 2024.  She has undergone cancer treatment , dx in 03-2021, breast cancer on the right breast, lumpectomy and 20 radiation treatments , no chemo.  but oral treatment for a total of 5 years .   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  Two adult children, one lives with parents in [place] and a son lives in [place]. Grandchildren: 2 g -daughters and one g-son. The couple adopted a puppy recently, which has been a pleasant challenge.  Hobby: reading,   Family history: both parents had dementia , a  brother is dx with dementia now.  Maternal family had a shorter life expectancy due to heart disease.   MRI obtained as abnormal, Mild cerebral atrophy.  MOCA was 24/ 30  at time of referral.   PCP started Aricept and refills are due.      Review of Systems: Out of a complete 14 system review, the patient complains of only the following symptoms, and all other reviewed systems are negative.  She is a loud snorer according to her husband.   Small mouth  Epworth Sleepiness score: not obtained.  Nocturia once at night.  Stops breathing.        05/01/2023    1:51 PM 01/31/2023    1:49 PM 01/31/2023    1:40 PM 11/01/2022    2:03 PM  Montreal Cognitive Assessment   Visuospatial/ Executive (0/5) 4 0 -- 4  Naming (0/3) 3 3  3   Attention: Read list of digits (0/2) 2 2  1   Attention: Read list of letters (0/1) 1 1  1   Attention: Serial 7 subtraction starting at 100 (0/3) 3 3  3   Language: Repeat phrase (0/2) 2 2  1   Language : Fluency (0/1) 1 1  0  Abstraction (0/2) 2 2  1   Delayed Recall (0/5) 4 5  4   Orientation (0/6) 6 6  6   Total 28 25  24   Adjusted Score (based  on education)  25  24  '    Social History   Socioeconomic History   Marital status: Married    Spouse name: John   Number of children: 2   Years of education: 16   Highest education level: Bachelor's degree (e.g., BA, AB, BS)  Occupational History   Occupation: Runner, broadcasting/film/video    Comment: retired  Tobacco Use   Smoking status: Former    Current packs/day: 0.00    Average packs/day: 0.3 packs/day for 20.0 years (5.0 ttl pk-yrs)    Types: Cigarettes    Start date: 05/09/1969    Quit date: 05/09/1989    Years since quitting: 34.0   Smokeless tobacco: Never  Vaping Use   Vaping status: Never Used  Substance and Sexual Activity   Alcohol use: Yes    Alcohol/week: 1.0 - 2.0 standard drink of alcohol    Types: 1 - 2 Glasses of wine per week    Comment: wine    Drug use: No   Sexual activity: Not Currently     Birth control/protection: Post-menopausal  Other Topics Concern   Not on file  Social History Narrative   Lives with her husband, daughter and granddaughter. She has two children. She enjoys reading and adult coloring.   Social Determinants of Health   Financial Resource Strain: Low Risk  (08/29/2022)   Overall Financial Resource Strain (CARDIA)    Difficulty of Paying Living Expenses: Not hard at all  Food Insecurity: No Food Insecurity (08/29/2022)   Hunger Vital Sign    Worried About Running Out of Food in the Last Year: Never true    Ran Out of Food in the Last Year: Never true  Transportation Needs: No Transportation Needs (08/29/2022)   PRAPARE - Administrator, Civil Service (Medical): No    Lack of Transportation (Non-Medical): No  Physical Activity: Insufficiently Active (08/29/2022)   Exercise Vital Sign    Days of Exercise per Week: 3 days    Minutes of Exercise per Session: 30 min  Stress: No Stress Concern Present (08/29/2022)   [employer] of Occupational Health - Occupational Stress Questionnaire    Feeling of Stress : Not at all  Social Connections: Socially Integrated (08/29/2022)   Social Connection and Isolation Panel [NHANES]    Frequency of Communication with Friends and Family: More than three times a week    Frequency of Social Gatherings with Friends and Family: More than three times a week    Attends Religious Services: More than 4 times per year    Active Member of Clubs or Organizations: Yes    Attends Banker Meetings: More than 4 times per year    Marital Status: Married  Catering manager Violence: Not At Risk (08/29/2022)   Humiliation, Afraid, Rape, and Kick questionnaire    Fear of Current or Ex-Partner: No    Emotionally Abused: No    Physically Abused: No    Sexually Abused: No    Family History  Problem Relation Age of Onset   Stroke Mother 69   Heart attack Mother 87   Cancer Mother 4       Uterine   Breast cancer  Mother 30   Heart attack Father 87   Cancer Father 16       Lung   Stroke Father 32   Hyperlipidemia Sister    Breast cancer Sister 13   Cancer Sister 71   Hyperlipidemia Sister    Thyroid nodules Brother  Hyperlipidemia Brother    Prostate cancer Brother 71   Cancer Brother    Hyperlipidemia Brother    Lung cancer Brother 74   Thyroid cancer Brother        dx. early 66s   Cancer Brother    Hyperlipidemia Brother    Anxiety disorder Daughter    Depression Daughter    Colon cancer Neg Hx    Esophageal cancer Neg Hx    Rectal cancer Neg Hx     Past Medical History:  Diagnosis Date   Allergy 1960s, 8s, 2000   Anxiousness 01/31/2023   Arthritis    Breast cancer (HCC)    Cataract    no surgery yet   Genetic testing 05/10/2021   Hyperlipidemia    Personal history of radiation therapy     Past Surgical History:  Procedure Laterality Date   BREAST EXCISIONAL BIOPSY Left 1999   BREAST LUMPECTOMY Right 05/31/2021   BREAST LUMPECTOMY WITH RADIOACTIVE SEED AND SENTINEL LYMPH NODE BIOPSY Right 05/31/2021   Procedure: RIGHT BREAST LUMPECTOMY WITH RADIOACTIVE SEED AND SENTINEL LYMPH NODE BIOPSY;  Surgeon: Abigail Miyamoto, MD;  Location: [facility];  Service: General;  Laterality: Right;   BREAST SURGERY  1999   Lumpectomy   CESAREAN SECTION  1982/1983   2 times   COLONOSCOPY  2008   EYE SURGERY  01/2021   Vitrectomy   LAPAROSCOPY     abd/tubal ligation   TUBAL LIGATION  1988   VITRECTOMY      Current Outpatient Medications  Medication Sig Dispense Refill   albuterol (VENTOLIN HFA) 108 (90 Base) MCG/ACT inhaler Inhale 2 puffs into the lungs every 6 (six) hours as needed for wheezing or shortness of breath. 25.5 g 0   anastrozole (ARIMIDEX) 1 MG tablet TAKE 1 TABLET(1 MG) BY MOUTH DAILY 90 tablet 4   atorvastatin (LIPITOR) 40 MG tablet Take 1 tablet (40 mg total) by mouth daily. 90 tablet 3   b complex vitamins tablet Take 1 tablet by mouth daily.      buPROPion (WELLBUTRIN SR) 150 MG 12 hr tablet Take 1 tablet (150 mg total) by mouth 2 (two) times daily. 180 tablet 3   calcium carbonate (TUMS EX) 750 MG chewable tablet Chew 1 tablet by mouth daily.     cholecalciferol (VITAMIN D) 1000 UNITS tablet Take 1,000 Units by mouth daily.     donepezil (ARICEPT ODT) 5 MG disintegrating tablet DISSOLVE 1 TABLET(5 MG) ON THE TONGUE AT BEDTIME 90 tablet 0   hydrochlorothiazide (HYDRODIURIL) 12.5 MG tablet Take 1 tablet (12.5 mg total) by mouth daily. 90 tablet 3   Magnesium 250 MG TABS Take by mouth daily.      MELATONIN PO Take 10 mg by mouth at bedtime.      METRONIDAZOLE, TOPICAL, 0.75 % LOTN APPLY TOPICALLY TO THE AFFECTED AREA IN THE MORNING AND AT BEDTIME 59 mL 0   traZODone (DESYREL) 50 MG tablet Take 1-2 tablets (50-100 mg total) by mouth at bedtime. 1 HOUR BEFORE BEDTIME AS NEEDED 180 tablet 3   vortioxetine HBr (TRINTELLIX) 5 MG TABS tablet Take 1 tablet (5 mg total) by mouth daily. 90 tablet 0   aspirin 81 MG tablet Take 81 mg by mouth daily. (Patient not taking: Reported on 05/01/2023)     KRILL OIL PO Take by mouth. (Patient not taking: Reported on 05/01/2023)     No current facility-administered medications for this visit.    Allergies as of 05/01/2023 - Review  Complete 05/01/2023  Allergen Reaction Noted   Peach [prunus persica]  03/12/2014   Sulfa antibiotics Hives 05/26/2020   Topamax [topiramate]  05/26/2020   Bioflavonoids Hives 08/04/2022   Shellfish allergy Hives and Other (See Comments) 01/01/2016    Vitals: BP 131/78   Pulse 78   Ht 5\' 1"  (1.549 m)   Wt 188 lb (85.3 kg)   BMI 35.52 kg/m  Last Weight:  Wt Readings from Last 1 Encounters:  05/01/23 188 lb (85.3 kg)   Last Height:   Ht Readings from Last 1 Encounters:  05/01/23 5\' 1"  (1.549 m)   Last BMI: @LASTBMI  Physical exam:  General: The patient is awake, alert and appears not in acute distress.  The patient is well groomed. Head: Normocephalic, atraumatic.   Neck is supple.   Neck circumference:16  Cardiovascular:  Regular rate and palpable peripheral pulse:  Respiratory: clear to auscultation.  Mallampati 3, small mouth , restricted opening, small lower jaw . Overbite.  No braces.   She is a loud snorer according to her husband.  Skin:  With evidence of ankle edema. Trunk: normal posture.   Neurologic exam : The patient is awake and alert, oriented to place and time.  Memory subjective  described as impaired  There is a normal attention span & concentration ability.  Speech is fluent without  dysarthria, dysphonia or aphasia.  Mood and affect are a bot worried, but appropriate.  Cranial nerves: Pupils are equal and briskly reactive to light. Funduscopic exam without  evidence of pallor or edema. Extraocular movements  in vertical and horizontal planes intact and without nystagmus. Visual fields by finger perimetry are intact. Hearing to finger rub intact.  Facial sensation intact to fine touch. Facial motor strength is symmetric and tongue and uvula move midline.  Motor exam:   Normal tone and normal muscle bulk and symmetric normal strength in all extremities. Grip Strength equal  Proximal strength of shoulder muscles and hip flexors was intact .  Sensory:  Fine touch and vibration were tested . Proprioception was tested in the upper extremities only and was  normal.  Coordination: Rapid alternating movements in the fingers/hands were normal.  Finger-to-nose maneuver was tested and showed no evidence of ataxia, dysmetria or tremor.  Gait and station: Patient walked without assistive device  Core Strength within normal limits. Stance is stable and of normal base.   Deep tendon reflexes: in the  upper and lower extremities are symmetric and    Assessment: Total time for face to face interview and examination, for review of  images and laboratory testing, neurophysiology testing and pre-existing records, including out-of -network ,  was 45 minutes. Assessment is as follows here:  1)  Memory decline noted by husband and by the patient . MOCA here normal but abnormal in the last 2 MOCA attempts with PCP, who has rightfully started on Aricept.  She will go to 10 mg .  Brain MRI non specific. Reviewed during visit.    She has a strong family history of dementia, brother ( age 58)  is affected now .    2) She also has risk factors for OSA and husband reports apnea and very loud snoring being witnessed.     Plan:  Treatment plan and additional workup planned after today includes:   1) ATN and dementia panel.  2)  HST ordered  3) increase aricept to 10 mg po .   Rv in 3-4 months with me or NP and will  need  PET amyloid study if bio-markers are positive.     Melvyn Novas, MD

## 2023-05-01 NOTE — Patient Instructions (Addendum)
     Memory Compensation Strategies  Use "WARM" strategy.  W= write it down  A= associate it  R= repeat it  M= make a mental note  2.   You can keep a Glass blower/designer.  Use a 3-ring notebook with sections for the following: calendar, important names and phone numbers,  medications, doctors' names/phone numbers, lists/reminders, and a section to journal what you did  each day.   3.    Use a calendar to write appointments down.  4.    Write yourself a schedule for the day.  This can be placed on the calendar or in a separate section of the Memory Notebook.  Keeping a  regular schedule can help memory.  5.    Use medication organizer with sections for each day or morning/evening pills.  You may need help loading it  6.    Keep a basket, or pegboard by the door.  Place items that you need to take out with you in the basket or on the pegboard.  You may also want to  include a message board for reminders.  7.    Use sticky notes.  Place sticky notes with reminders in a place where the task is performed.  For example: " turn off the  stove" placed by the stove, "lock the door" placed on the door at eye level, " take your medications" on  the bathroom mirror or by the place where you normally take your medications.  8.    Use alarms/timers.  Use while cooking to remind yourself to check on food or as a reminder to take your medicine, or as a  reminder to make a call, or as a reminder to perform another task, etc.    Memory decline noted by husband and by the patient . MOCA here normal but abnormal in the last 2 MOCA attempts with PCP, who has rightfully started on Aricept.  She will go to 10 mg .  Brain MRI non specific.    She has a strong family history of dementia, brother ( age 71)  is affected now .    2) She also has risk factors for OSA and husband reports apnea and snoring being witnessed.      Plan:  Treatment plan and additional workup planned after today includes:   1)  ATN and dementia panel.  2)  HST ordered  3) increase aricept to 10 mg po .   Rv in 3-4 months with me or NP and will need  PET amyloid study if bio-markers are positive.

## 2023-05-05 ENCOUNTER — Encounter: Payer: Self-pay | Admitting: Hematology and Oncology

## 2023-05-08 ENCOUNTER — Inpatient Hospital Stay: Payer: Medicare PPO | Attending: Hematology and Oncology | Admitting: Hematology and Oncology

## 2023-05-08 DIAGNOSIS — Z87891 Personal history of nicotine dependence: Secondary | ICD-10-CM | POA: Insufficient documentation

## 2023-05-08 DIAGNOSIS — C50511 Malignant neoplasm of lower-outer quadrant of right female breast: Secondary | ICD-10-CM | POA: Insufficient documentation

## 2023-05-08 DIAGNOSIS — Z923 Personal history of irradiation: Secondary | ICD-10-CM | POA: Insufficient documentation

## 2023-05-08 DIAGNOSIS — Z170 Estrogen receptor positive status [ER+]: Secondary | ICD-10-CM | POA: Insufficient documentation

## 2023-05-08 DIAGNOSIS — Z79811 Long term (current) use of aromatase inhibitors: Secondary | ICD-10-CM | POA: Insufficient documentation

## 2023-05-08 NOTE — Progress Notes (Signed)
Thanks for heads up will be scheduling follow up with patient.

## 2023-05-08 NOTE — Progress Notes (Signed)
Can we get her scheduled to discuss labs results from neurology?

## 2023-05-15 ENCOUNTER — Encounter: Payer: Self-pay | Admitting: Physician Assistant

## 2023-05-15 ENCOUNTER — Ambulatory Visit: Payer: Medicare PPO | Admitting: Physician Assistant

## 2023-05-15 VITALS — BP 130/76 | HR 71 | Ht 61.0 in | Wt 189.0 lb

## 2023-05-15 DIAGNOSIS — G478 Other sleep disorders: Secondary | ICD-10-CM

## 2023-05-15 DIAGNOSIS — R0683 Snoring: Secondary | ICD-10-CM

## 2023-05-15 DIAGNOSIS — R718 Other abnormality of red blood cells: Secondary | ICD-10-CM

## 2023-05-15 DIAGNOSIS — E66812 Obesity, class 2: Secondary | ICD-10-CM | POA: Diagnosis not present

## 2023-05-15 DIAGNOSIS — E6609 Other obesity due to excess calories: Secondary | ICD-10-CM

## 2023-05-15 DIAGNOSIS — Z6835 Body mass index (BMI) 35.0-35.9, adult: Secondary | ICD-10-CM | POA: Diagnosis not present

## 2023-05-15 DIAGNOSIS — Z23 Encounter for immunization: Secondary | ICD-10-CM | POA: Diagnosis not present

## 2023-05-15 DIAGNOSIS — R7303 Prediabetes: Secondary | ICD-10-CM | POA: Diagnosis not present

## 2023-05-15 DIAGNOSIS — G3184 Mild cognitive impairment, so stated: Secondary | ICD-10-CM

## 2023-05-15 LAB — CBC WITH DIFFERENTIAL/PLATELET
Basophils Absolute: 0 10*3/uL (ref 0.0–0.2)
Basos: 1 %
EOS (ABSOLUTE): 0.4 10*3/uL (ref 0.0–0.4)
Eos: 7 %
Hematocrit: 40.5 % (ref 34.0–46.6)
Hemoglobin: 13.3 g/dL (ref 11.1–15.9)
Immature Grans (Abs): 0 10*3/uL (ref 0.0–0.1)
Immature Granulocytes: 0 %
Lymphocytes Absolute: 1 10*3/uL (ref 0.7–3.1)
Lymphs: 18 %
MCH: 32.3 pg (ref 26.6–33.0)
MCHC: 32.8 g/dL (ref 31.5–35.7)
MCV: 98 fL — ABNORMAL HIGH (ref 79–97)
Monocytes Absolute: 0.5 10*3/uL (ref 0.1–0.9)
Monocytes: 8 %
Neutrophils Absolute: 3.5 10*3/uL (ref 1.4–7.0)
Neutrophils: 66 %
Platelets: 264 10*3/uL (ref 150–450)
RBC: 4.12 x10E6/uL (ref 3.77–5.28)
RDW: 12.4 % (ref 11.7–15.4)
WBC: 5.3 10*3/uL (ref 3.4–10.8)

## 2023-05-15 LAB — COMPREHENSIVE METABOLIC PANEL
ALT: 18 [IU]/L (ref 0–32)
AST: 18 [IU]/L (ref 0–40)
Albumin: 4.1 g/dL (ref 3.8–4.8)
Alkaline Phosphatase: 115 [IU]/L (ref 44–121)
BUN/Creatinine Ratio: 16 (ref 12–28)
BUN: 14 mg/dL (ref 8–27)
Bilirubin Total: 0.3 mg/dL (ref 0.0–1.2)
CO2: 22 mmol/L (ref 20–29)
Calcium: 9.6 mg/dL (ref 8.7–10.3)
Chloride: 100 mmol/L (ref 96–106)
Creatinine, Ser: 0.89 mg/dL (ref 0.57–1.00)
Globulin, Total: 2.2 g/dL (ref 1.5–4.5)
Glucose: 93 mg/dL (ref 70–99)
Potassium: 3.9 mmol/L (ref 3.5–5.2)
Sodium: 141 mmol/L (ref 134–144)
Total Protein: 6.3 g/dL (ref 6.0–8.5)
eGFR: 69 mL/min/{1.73_m2} (ref >59)

## 2023-05-15 LAB — PROTEIN ELECTROPHORESIS, SERUM
A/G Ratio: 1.4 (ref 0.7–1.7)
Albumin ELP: 3.7 g/dL (ref 2.9–4.4)
Alpha 1: 0.2 g/dL (ref 0.0–0.4)
Alpha 2: 0.7 g/dL (ref 0.4–1.0)
Beta: 0.9 g/dL (ref 0.7–1.3)
Gamma Globulin: 0.8 g/dL (ref 0.4–1.8)
Globulin, Total: 2.6 g/dL (ref 2.2–3.9)

## 2023-05-15 LAB — HIV ANTIBODY (ROUTINE TESTING W REFLEX): HIV Screen 4th Generation wRfx: NONREACTIVE

## 2023-05-15 LAB — ENA+DNA/DS+SJORGEN'S
ENA RNP Ab: <0.2 AI (ref 0.0–0.9)
ENA SM Ab Ser-aCnc: <0.2 AI (ref 0.0–0.9)
ENA SSA (RO) Ab: <0.2 AI (ref 0.0–0.9)
ENA SSB (LA) Ab: <0.2 AI (ref 0.0–0.9)
dsDNA Ab: 1 [IU]/mL (ref 0–9)

## 2023-05-15 LAB — VITAMIN B12: Vitamin B-12: 1131 pg/mL (ref 232–1245)

## 2023-05-15 LAB — ATN PROFILE
A -- Beta-amyloid 42/40 Ratio: 0.113 (ref >0.102)
Beta-amyloid 40: 201.17 pg/mL
Beta-amyloid 42: 22.83 pg/mL
N -- NfL, Plasma: 3.58 pg/mL (ref 0.00–7.64)
T -- p-tau181: 0.92 pg/mL (ref 0.00–0.97)

## 2023-05-15 LAB — TSH+FREE T4
Free T4: 1.43 ng/dL (ref 0.82–1.77)
TSH: 3.49 u[IU]/mL (ref 0.450–4.500)

## 2023-05-15 LAB — HOMOCYSTEINE: Homocysteine: 8.7 umol/L (ref 0.0–19.2)

## 2023-05-15 LAB — SEDIMENTATION RATE: Sed Rate: 5 mm/h (ref 0–40)

## 2023-05-15 LAB — RPR: RPR Ser Ql: NONREACTIVE

## 2023-05-15 LAB — METHYLMALONIC ACID, SERUM: Methylmalonic Acid: 200 nmol/L (ref 0–378)

## 2023-05-15 LAB — ANA W/REFLEX: Anti Nuclear Antibody (ANA): POSITIVE — AB

## 2023-05-15 LAB — HEMOGLOBIN A1C
Est. average glucose Bld gHb Est-mCnc: 117 mg/dL
Hgb A1c MFr Bld: 5.7 % — ABNORMAL HIGH (ref 4.8–5.6)

## 2023-05-15 NOTE — Patient Instructions (Signed)
Preventing Type 2 Diabetes Mellitus  Type 2 diabetes, also called type 2 diabetes mellitus, is a long-term (chronic) disease that affects sugar (glucose) levels in your blood. Normally, a hormone called insulin allows glucose to enter cells in your body. The cells use glucose for energy. With type 2 diabetes, you will have one or both of these problems:  Your pancreas does not make enough insulin.  Cells in your body do not respond properly to insulin that your body makes (insulin resistance).  Insulin resistance or lack of insulin causes extra glucose to build up in the blood instead of going into cells. As a result, high blood glucose (hyperglycemia) develops. That can cause many complications. Being overweight or obese and having an inactive (sedentary) lifestyle can increase your risk for diabetes. Type 2 diabetes can be delayed or prevented by making certain nutrition and lifestyle changes.  How can this condition affect me?  If you do not take steps to prevent diabetes, your blood glucose levels may keep increasing over time. Too much glucose in your blood for a long time can damage your blood vessels, heart, kidneys, nerves, and eyes.  Type 2 diabetes can lead to chronic health problems and complications, such as:  Heart disease.  Stroke.  Blindness.  Kidney disease.  Depression.  Poor circulation in your feet and legs. In severe cases, a foot or leg may need to be surgically removed (amputated).  What can increase my risk?  You may be more likely to develop type 2 diabetes if you:  Have type 2 diabetes in your family.  Are overweight or obese.  Have a sedentary lifestyle.  Have insulin resistance or a history of prediabetes.  Have a history of pregnancy-related (gestational) diabetes or polycystic ovary syndrome (PCOS).  What actions can I take to prevent this?  It can be difficult to recognize signs of type 2 diabetes. Taking action to prevent the disease before you develop symptoms is the best way to avoid  possible damage to your body. Making certain nutrition and lifestyle changes may prevent or delay the disease and related health problems.  Nutrition    Eat healthy meals and snacks regularly. Do not skip meals. Fruit or a handful of nuts is a healthy snack between meals.  Drink water throughout the day. Avoid drinks that contain added sugar, such as soda or sweetened tea. Drink enough fluid to keep your urine pale yellow.  Follow instructions from your health care provider about eating or drinking restrictions.  Limit the amount of food you eat by:  Managing how much you eat at a time (portion size).  Checking food labels for the serving sizes of food.  Using a kitchen scale to weigh amounts of food.  Saut or steam food instead of frying it. Cook with water or broth instead of oils or butter.  Limit saturated fat and salt (sodium) in your diet. Have no more than 1 tsp (2,400 mg) of sodium a day. If you have heart disease or high blood pressure, use less than ? tsp (1,500 mg) of sodium a day.  Lifestyle    Lose weight if needed and as told. Your health care provider can determine how much weight loss is best for you and can help you lose weight safely.  If you are overweight or obese, you may be told to lose at least 5?7% of your body weight.  Manage blood pressure, cholesterol, and stress. Your health care provider will help determine the best treatment  for you.  Do not use any products that contain nicotine or tobacco. These products include cigarettes, chewing tobacco, and vaping devices, such as e-cigarettes. If you need help quitting, ask your health care provider.  Activity    Do physical activity that makes your heart beat faster and makes you sweat (moderate intensity). Do this for at least 30 minutes on at least 5 days of the week, or as much as told by your health care provider.  Ask your health care provider what activities are safe for you. A mix of activities may be best, such as walking, swimming,  cycling, and strength training.  Try to add physical activity into your day. For example:  Park your car farther away than usual so that you walk more.  Take a walk during your lunch break.  Use stairs instead of elevators or escalators.  Walk or bike to work instead of driving.  Alcohol use  If you drink alcohol:  Limit how much you have to:  0?1 drink a day for women who are not pregnant.  0?2 drinks a day for men.  Know how much alcohol is in your drink. In the [place] one drink equals one 12 oz bottle of beer (355 mL), one 5 oz glass of wine (148 mL), or one 1 oz glass of hard liquor (44 mL).  General information  Talk with your health care provider about your risk factors and how you can reduce your risk for diabetes.  Have your blood glucose tested regularly, as told by your health care provider.  Get screening tests as told by your health care provider. You may have these regularly, especially if you have certain risk factors for type 2 diabetes.  Make an appointment with a registered dietitian. This diet and nutrition specialist can help you make a healthy eating plan and help you understand portion sizes and food labels.  Where to find support  Ask your health care provider to recommend a registered dietitian, a certified diabetes care and education specialist, or a weight loss program.  Look for local or online weight loss groups.  Join a gym, fitness club, or outdoor activity group, such as a walking club.  Where to find more information  For help and guidance and to learn more about diabetes and diabetes prevention, visit:  American Diabetes Association (ADA): www.diabetes.[employer] of Diabetes and Digestive and Kidney Diseases: CarFlippers.tn  To learn more about healthy eating, visit:  [place] Department of Agriculture Architect): https://ball-collins.biz/  Office of Disease Prevention and Health Promotion (ODPHP): ResearchName.uy  Summary  You can delay or prevent type 2 diabetes by eating healthy  foods, losing weight if needed, and increasing your physical activity.  Talk with your health care provider about your risk factors for type 2 diabetes and how you can reduce your risk.  It can be difficult to recognize the signs of type 2 diabetes. The best way to avoid possible damage to your body is to take action to prevent the disease before you develop symptoms.  Get screening tests as told by your health care provider.  This information is not intended to replace advice given to you by your health care provider. Make sure you discuss any questions you have with your health care provider.  Document Revised: 10/05/2020 Document Reviewed: 10/05/2020  Elsevier Patient Education  2024 [employer].

## 2023-05-15 NOTE — Progress Notes (Unsigned)
Established Patient Office Visit  Subjective   Patient ID: Janice Dudley, female    DOB: 02/09/1951  Age: 72 y.o. MRN: [id number]  Chief Complaint  Patient presents with   Medical Management of Chronic Issues    Tbd  lab results     HPI Pt is a 72 yo female who presents to the clinic to go over labs results drawn at neurology.   Neurology is still working patient up but increased aricept to 10mg . Pt is tolerating well. Pt does feel like this is helpful. She is taking trazodone for sleep and wellbutrin for mood. Trintellix was stopped.   .. Active Ambulatory Problems    Diagnosis Date Noted   Hyperlipidemia    Fibrocystic breast 03/25/2014   Hypertriglyceridemia 04/15/2014   Morbid obesity (HCC) 04/15/2014   Abnormal weight gain 05/16/2014   Atypical pigmented skin lesion 03/02/2016   Post-menopausal 03/02/2016   No energy 06/22/2017   Trouble in sleeping 06/22/2017   Strain of abdominal muscle 09/28/2017   H/O osteopenia 11/29/2017   Left breast mass 01/14/2019   Hypotension 01/14/2019   Laceration of right lower extremity 01/14/2019   Mixed hyperlipidemia 05/26/2020   Subjective hearing change 05/26/2020   Primary osteoarthritis of both knees 05/27/2020   Elevated liver enzymes 12/28/2020   Malignant neoplasm of lower-outer quadrant of right breast of female, estrogen receptor positive (HCC) 04/23/2021   Family history of breast cancer 04/29/2021   Family history of uterine cancer 04/29/2021   Family history of prostate cancer 04/29/2021   Genetic testing 05/10/2021   Lower extremity edema 07/02/2021   Seborrheic dermatitis 07/02/2021   Malignant tumor of breast (HCC) 04/26/2021   Vertigo 05/04/2022   Jaw pain 05/04/2022   Trigger finger, left middle finger 09/05/2022   Mood changes 11/18/2022   Short-term memory loss 11/18/2022   Mild cognitive impairment 11/18/2022   Cerebral atrophy, mild (HCC) 01/24/2023   Anxiousness 01/31/2023   Depressed mood  01/31/2023   Family history of dementia 01/31/2023   History of right breast cancer 05/01/2023   Risk factors for obstructive sleep apnea 05/01/2023   Retrognathia 05/01/2023   Pre-diabetes 05/15/2023   Snoring 05/16/2023   Non-restorative sleep 05/16/2023   Class 2 obesity due to excess calories without serious comorbidity with body mass index (BMI) of 35.0 to 35.9 in adult 05/16/2023   Elevated MCV 05/16/2023   Resolved Ambulatory Problems    Diagnosis Date Noted   Sprain of ankle 05/16/2014   Obesity (BMI 30.0-34.9) 06/22/2017   Past Medical History:  Diagnosis Date   Allergy 1960s, 1970s, 2000   Arthritis    Breast cancer (HCC)    Cataract    Personal history of radiation therapy       Review of Systems  All other systems reviewed and are negative.     Objective:     BP 130/76   Pulse 71   Ht 5\' 1"  (1.549 m)   Wt 189 lb (85.7 kg)   SpO2 99%   BMI 35.71 kg/m  BP Readings from Last 3 Encounters:  05/15/23 130/76  05/01/23 131/78  11/01/22 131/67   Wt Readings from Last 3 Encounters:  05/15/23 189 lb (85.7 kg)  05/01/23 188 lb (85.3 kg)  11/01/22 184 lb (83.5 kg)      Physical Exam Constitutional:      Appearance: Normal appearance.  Cardiovascular:     Rate and Rhythm: Normal rate and regular rhythm.  Pulmonary:     Effort:  Pulmonary effort is normal.     Breath sounds: Normal breath sounds.  Neurological:     General: No focal deficit present.     Mental Status: She is alert and oriented to person, place, and time.  Psychiatric:        Mood and Affect: Mood normal.       Assessment & Plan:  Marland KitchenMarland KitchenDorothy "[name]" was seen today for medical management of chronic issues.  Diagnoses and all orders for this visit:  Pre-diabetes  Elevated MCV -     CBC with Differential/Platelet  Immunization due -     Flu Vaccine Trivalent High Dose (Fluad)  Mild cognitive impairment -     Home sleep test  Non-restorative sleep -     Home sleep  test  Snoring -     Home sleep test  Class 2 obesity due to excess calories without serious comorbidity with body mass index (BMI) of 35.0 to 35.9 in adult -     Home sleep test   A1C was 5.7.  Discussed low sugar/carb diet Gave handout for a resource Encouraged walking 30 minutes daily for exercise Declined starting any medications at this time Recheck in 6 months next A1C  Elevated MCV with normal B12 and hemoglobin Recheck today Increase iron rich foods  Pt has risk factors for sleep apnea HST ordered today  Flu shot given, covid vaccine declined, encouraged patient to get shingles vaccine at pharmacy.     Tandy Gaw, PA-C

## 2023-05-16 ENCOUNTER — Encounter: Payer: Self-pay | Admitting: Physician Assistant

## 2023-05-16 DIAGNOSIS — G478 Other sleep disorders: Secondary | ICD-10-CM | POA: Insufficient documentation

## 2023-05-16 DIAGNOSIS — E6609 Other obesity due to excess calories: Secondary | ICD-10-CM | POA: Insufficient documentation

## 2023-05-16 DIAGNOSIS — R0683 Snoring: Secondary | ICD-10-CM | POA: Insufficient documentation

## 2023-05-16 DIAGNOSIS — R718 Other abnormality of red blood cells: Secondary | ICD-10-CM | POA: Insufficient documentation

## 2023-05-16 DIAGNOSIS — Z6835 Body mass index (BMI) 35.0-35.9, adult: Secondary | ICD-10-CM | POA: Insufficient documentation

## 2023-05-16 LAB — CBC WITH DIFFERENTIAL/PLATELET
Basophils Absolute: 0.1 10*3/uL (ref 0.0–0.2)
Basos: 1 %
EOS (ABSOLUTE): 0.3 10*3/uL (ref 0.0–0.4)
Eos: 5 %
Hematocrit: 40.3 % (ref 34.0–46.6)
Hemoglobin: 13 g/dL (ref 11.1–15.9)
Immature Grans (Abs): 0 10*3/uL (ref 0.0–0.1)
Immature Granulocytes: 0 %
Lymphocytes Absolute: 1 10*3/uL (ref 0.7–3.1)
Lymphs: 17 %
MCH: 31.8 pg (ref 26.6–33.0)
MCHC: 32.3 g/dL (ref 31.5–35.7)
MCV: 99 fL — ABNORMAL HIGH (ref 79–97)
Monocytes Absolute: 0.6 10*3/uL (ref 0.1–0.9)
Monocytes: 10 %
Neutrophils Absolute: 3.7 10*3/uL (ref 1.4–7.0)
Neutrophils: 67 %
Platelets: 257 10*3/uL (ref 150–450)
RBC: 4.09 x10E6/uL (ref 3.77–5.28)
RDW: 12.5 % (ref 11.7–15.4)
WBC: 5.6 10*3/uL (ref 3.4–10.8)

## 2023-05-18 ENCOUNTER — Ambulatory Visit
Admission: RE | Admit: 2023-05-18 | Discharge: 2023-05-18 | Disposition: A | Discharge status: Home / Self Care | Payer: Medicare PPO | Source: Ambulatory Visit | Attending: Hematology and Oncology | Admitting: Hematology and Oncology

## 2023-05-18 DIAGNOSIS — N6489 Other specified disorders of breast: Secondary | ICD-10-CM | POA: Diagnosis not present

## 2023-05-18 DIAGNOSIS — Z170 Estrogen receptor positive status [ER+]: Secondary | ICD-10-CM

## 2023-05-19 ENCOUNTER — Encounter: Payer: Self-pay | Admitting: Physician Assistant

## 2023-05-19 NOTE — Telephone Encounter (Signed)
Can you help patient ?

## 2023-05-20 ENCOUNTER — Encounter: Payer: Self-pay | Admitting: Physician Assistant

## 2023-05-22 ENCOUNTER — Inpatient Hospital Stay: Payer: Medicare PPO | Admitting: Hematology and Oncology

## 2023-05-23 ENCOUNTER — Other Ambulatory Visit: Payer: Self-pay | Admitting: Physician Assistant

## 2023-05-23 DIAGNOSIS — L719 Rosacea, unspecified: Secondary | ICD-10-CM

## 2023-05-24 ENCOUNTER — Inpatient Hospital Stay: Payer: Medicare PPO | Admitting: Hematology and Oncology

## 2023-05-24 VITALS — BP 121/63 | HR 69 | Temp 97.5°F | Resp 18 | Wt 188.2 lb

## 2023-05-24 DIAGNOSIS — Z923 Personal history of irradiation: Secondary | ICD-10-CM | POA: Diagnosis not present

## 2023-05-24 DIAGNOSIS — C50511 Malignant neoplasm of lower-outer quadrant of right female breast: Secondary | ICD-10-CM

## 2023-05-24 DIAGNOSIS — Z87891 Personal history of nicotine dependence: Secondary | ICD-10-CM | POA: Diagnosis not present

## 2023-05-24 DIAGNOSIS — Z79811 Long term (current) use of aromatase inhibitors: Secondary | ICD-10-CM | POA: Diagnosis not present

## 2023-05-24 DIAGNOSIS — Z170 Estrogen receptor positive status [ER+]: Secondary | ICD-10-CM | POA: Diagnosis not present

## 2023-05-24 NOTE — Progress Notes (Signed)
[facility]  Telephone:([phone]) [phone] [phone]) [phone]    ID: Janice Dudley DOB: [birth year]  MR#: [id number]  AOZ#:[id number]  Patient Care Team: Nolene Ebbs as PCP - General (Family Medicine) Pershing Proud, RN as Oncology Nurse Navigator Donnelly Angelica, RN as Oncology Nurse Navigator Abigail Miyamoto, MD as Consulting Physician (General Surgery) Magrinat, Valentino Hue, MD (Inactive) as Consulting Physician (Oncology) Jovonne Puffer, MD as Consulting Physician (Radiation Oncology) Zelphia Cairo, MD as Consulting Physician (Obstetrics and Gynecology) Monica Becton, MD as Consulting Physician (Sports Medicine) Belva Chimes, MD as Referring Physician (Specialist) Rachel Moulds, MD OTHER MD:  CHIEF COMPLAINT: Estrogen receptor positive breast cancer  CURRENT TREATMENT: Anastrazole.  INTERVAL HISTORY: Discussed the use of AI scribe software for clinical note transcription with the patient, who gave verbal consent to proceed.  History of Present Illness        The patient, with a history of breast cancer and recent diagnosis of brain atrophy, presents with concerns about changes in memory and mood. She reports that she has been "acting different," forgetting things, and experiencing mood changes, which she describes as being "crankier than usual." These symptoms have been ongoing for several years, with subtle signs that have gradually increased over time. The patient also mentions difficulty with walking, attributing it to her feet not lifting as she should.  The patient has been taking Aricept for her brain atrophy, which she believes is helping with her memory and mood. She reports that she feels good and does not feel like her condition is negatively affecting her life. Since starting Aricept, she has been able to do things she was not able to do before.  In addition to her cognitive concerns, the patient also reports tenderness in  her breast and an itchy and irritated nipple area. She has been using vitamin E oil, which has helped with the irritation. Despite the tenderness, recent diagnostic mammograms have not shown any abnormalities.   HISTORY OF CURRENT ILLNESS:  Oncology History  Malignant neoplasm of lower-outer quadrant of right breast of female, estrogen receptor positive (HCC)  04/12/2021 Mammogram    She underwent bilateral diagnostic mammography with tomography and bilateral breast ultrasonography at [facility] on 04/12/2021 showing: breast density category B; new indeterminate 7 mm mass in right breast at 7 o'clock; no evidence of right axillary adenopathy; the likely-benign left breast mass at 4 o'clock is stable.   04/15/2021 Pathology Results    The pathology from this procedure ([id number]) showed: invasive ductal carcinoma, grade 2/3. Prognostic indicators significant for: estrogen receptor, >95% positive with strong staining intensity and progesterone receptor, 40% positive with moderate staining intensity. Proliferation marker Ki67 at 15%. HER2 equivocal by immunohistochemistry (2+), but negative by fluorescent in situ hybridization with a signals ratio 2.02 and 1.89 and number per cell 4.55 and 4.15}.  She also underwent biopsy of the left breast mass on 04/27/2021. Pathology ([id number]) was benign, showing fibrocystic changes including apocrine metaplasia.   04/23/2021 Initial Diagnosis   Malignant neoplasm of lower-outer quadrant of right breast of female, estrogen receptor positive (HCC)   04/28/2021 Cancer Staging   Staging form: Breast, AJCC 8th Edition - Clinical stage from 04/28/2021: Stage IA (cT1b, cN0, cM0, G3, ER+, PR+, HER2-) - Signed by Lowella Dell, MD on 04/28/2021 Stage prefix: Initial diagnosis Histologic grading system: 3 grade system   05/05/2021 Genetic Testing   [facility] CustomNext Panel was negative. No pathogenic variants were identified in the 47 genes analyzed.  Report  date is 05/10/2021.   The CustomNext-Cancer+RNAinsight panel offered by [employer] includes sequencing and rearrangement analysis for the following 47 genes:  APC, ATM, AXIN2, BARD1, BMPR1A, BRCA1, BRCA2, BRIP1, CDH1, CDK4, CDKN2A, CHEK2, DICER1, EPCAM, GREM1, HOXB13, MEN1, MLH1, MSH2, MSH3, MSH6, MUTYH, NBN, NF1, NF2, NTHL1, PALB2, PMS2, POLD1, POLE, PTEN, RAD51C, RAD51D, RECQL, RET, SDHA, SDHAF2, SDHB, SDHC, SDHD, SMAD4, SMARCA4, STK11, TP53, TSC1, TSC2, and VHL.  RNA data is routinely analyzed for use in variant interpretation for all genes.    05/31/2021 Surgery   right lumpectomy with sentinel lymph node sampling 05/31/2021 showed a pT1a pN0, stage IA invasive ductal carcinoma, grade 2, with negative margins (a) a total of 3 right axillary lymph nodes were removed.    - 08/05/2021 Radiation Therapy   Completed adjuvant radiation      PAST MEDICAL HISTORY: Past Medical History:  Diagnosis Date   Allergy 1960s, 45s, 2000   Anxiousness 01/31/2023   Arthritis    Breast cancer (HCC)    Cataract    no surgery yet   Genetic testing 05/10/2021   Hyperlipidemia    Personal history of radiation therapy     PAST SURGICAL HISTORY: Past Surgical History:  Procedure Laterality Date   BREAST EXCISIONAL BIOPSY Left 1999   BREAST LUMPECTOMY Right 05/31/2021   BREAST LUMPECTOMY WITH RADIOACTIVE SEED AND SENTINEL LYMPH NODE BIOPSY Right 05/31/2021   Procedure: RIGHT BREAST LUMPECTOMY WITH RADIOACTIVE SEED AND SENTINEL LYMPH NODE BIOPSY;  Surgeon: Abigail Miyamoto, MD;  Location: [facility];  Service: General;  Laterality: Right;   BREAST SURGERY  1999   Lumpectomy   CESAREAN SECTION  1982/1983   2 times   COLONOSCOPY  2008   EYE SURGERY  01/2021   Vitrectomy   LAPAROSCOPY     abd/tubal ligation   TUBAL LIGATION  1988   VITRECTOMY      FAMILY HISTORY: Family History  Problem Relation Age of Onset   Stroke Mother 98   Heart attack Mother 52   Cancer Mother 71        Uterine   Breast cancer Mother 34   Heart attack Father 2   Cancer Father 45       Lung   Stroke Father 41   Hyperlipidemia Sister    Breast cancer Sister 54   Cancer Sister 65   Hyperlipidemia Sister    Thyroid nodules Brother    Hyperlipidemia Brother    Prostate cancer Brother 58   Cancer Brother    Hyperlipidemia Brother    Lung cancer Brother 50   Thyroid cancer Brother        dx. early 47s   Cancer Brother    Hyperlipidemia Brother    Anxiety disorder Daughter    Depression Daughter    Colon cancer Neg Hx    Esophageal cancer Neg Hx    Rectal cancer Neg Hx    Her father died at age 43 from SAH. He had a history of lung cancer at age 20. Her mother died at age 49 from uterine cancer. She had a history of breast cancer at age 81 and apparently was treated with tamoxifen. [name] has three brothers and three sisters. One sister had breast and uterine cancer at age 65 (possibly also treated with tamoxifen), a brother had lung cancer at age 10, and another brother had prostate cancer at age 35.  There is no history of colon cancer in the family   GYNECOLOGIC HISTORY:  No LMP recorded. Patient is postmenopausal. Menarche: 72 years old Age at first live birth: 71 years old GX P 2 LMP 2006 Contraceptive: used for 12 years HRT: never used  Hysterectomy? no BSO? no   SOCIAL HISTORY: (updated 04/2021)  Janice "[name]" is currently retired from working as a Runner, broadcasting/film/video for pre-K to [employer]. Husband Jonny Ruiz is a retired Emergency planning/management officer. Daughter Amada Jupiter, age 71, is a Haematologist in [place], [place]. Son Gerlene Burdock, age 69, is an MRI tech in [place]. [name] has three grandchildren. She attends a [employer] in [place].    ADVANCED DIRECTIVES: in place   HEALTH MAINTENANCE: Social History   Tobacco Use   Smoking status: Former    Current packs/day: 0.00    Average packs/day: 0.3 packs/day for 20.0 years (5.0 ttl pk-yrs)    Types: Cigarettes    Start date: 05/09/1969     Quit date: 05/09/1989    Years since quitting: 34.0   Smokeless tobacco: Never  Vaping Use   Vaping status: Never Used  Substance Use Topics   Alcohol use: Yes    Alcohol/week: 1.0 - 2.0 standard drink of alcohol    Types: 1 - 2 Glasses of wine per week    Comment: wine    Drug use: No     Colonoscopy: 04/2018 (Dr. Christella Hartigan), recall 2029  PAP: 2021  Bone density: 2021   Allergies  Allergen Reactions   Peach [Prunus Persica]     Hives/itching   Sulfa Antibiotics Hives   Topamax [Topiramate]     Bilateral leg pain   Bioflavonoids Hives   Shellfish Allergy Hives and Other (See Comments)    Current Outpatient Medications  Medication Sig Dispense Refill   albuterol (VENTOLIN HFA) 108 (90 Base) MCG/ACT inhaler Inhale 2 puffs into the lungs every 6 (six) hours as needed for wheezing or shortness of breath. 25.5 g 0   anastrozole (ARIMIDEX) 1 MG tablet TAKE 1 TABLET(1 MG) BY MOUTH DAILY 90 tablet 4   atorvastatin (LIPITOR) 40 MG tablet Take 1 tablet (40 mg total) by mouth daily. 90 tablet 3   b complex vitamins tablet Take 1 tablet by mouth daily.     buPROPion (WELLBUTRIN SR) 150 MG 12 hr tablet Take 1 tablet (150 mg total) by mouth 2 (two) times daily. 180 tablet 3   calcium carbonate (TUMS EX) 750 MG chewable tablet Chew 1 tablet by mouth daily.     cholecalciferol (VITAMIN D) 1000 UNITS tablet Take 1,000 Units by mouth daily.     donepezil (ARICEPT) 10 MG tablet Take 1 tablet (10 mg total) by mouth at bedtime. 30 tablet 5   hydrochlorothiazide (HYDRODIURIL) 12.5 MG tablet Take 1 tablet (12.5 mg total) by mouth daily. 90 tablet 3   KRILL OIL PO Take by mouth.     Magnesium 250 MG TABS Take by mouth daily.      MELATONIN PO Take 10 mg by mouth at bedtime.      METRONIDAZOLE, TOPICAL, 0.75 % LOTN APPLY TOPICALLY TO THE AFFECTED AREA IN THE MORNING AND AT BEDTIME 59 mL 0   traZODone (DESYREL) 50 MG tablet Take 1-2 tablets (50-100 mg total) by mouth at bedtime. 1 HOUR BEFORE  BEDTIME AS NEEDED 180 tablet 3   No current facility-administered medications for this visit.    OBJECTIVE: White woman in no acute distress  Vitals:   05/24/23 1357  BP: 121/63  Pulse: 69  Resp: 18  Temp: (!) 97.5 F (36.4 C)  SpO2: 99%      Body mass index is 35.56 kg/m.   Wt Readings from Last 3 Encounters:  05/24/23 188 lb 3.2 oz (85.4 kg)  05/15/23 189 lb (85.7 kg)  05/01/23 188 lb (85.3 kg)    ECOG FS:1 - Symptomatic but completely ambulatory  Physical Exam Constitutional:      Appearance: Normal appearance.  Chest:     Comments: BREAST: Scar tissue present, indicative of post-surgical changes. No abnormalities detected. Contralateral breast appears healthy. Post-radiation and post-surgical changes noted. No additional abnormalities. Musculoskeletal:     Cervical back: Normal range of motion. No rigidity.  Lymphadenopathy:     Cervical: No cervical adenopathy.  Neurological:     Mental Status: She is alert.     LAB RESULTS:  CMP     Component Value Date/Time   NA 141 05/01/2023 1450   K 3.9 05/01/2023 1450   CL 100 05/01/2023 1450   CO2 22 05/01/2023 1450   GLUCOSE 93 05/01/2023 1450   GLUCOSE 84 11/01/2022 1418   BUN 14 05/01/2023 1450   CREATININE 0.89 05/01/2023 1450   CREATININE 0.89 11/01/2022 1418   CALCIUM 9.6 05/01/2023 1450   PROT 6.3 05/01/2023 1450   ALBUMIN 4.1 05/01/2023 1450   AST 18 05/01/2023 1450   AST 20 04/28/2021 1231   ALT 18 05/01/2023 1450   ALT 16 04/28/2021 1231   ALKPHOS 115 05/01/2023 1450   BILITOT 0.3 05/01/2023 1450   BILITOT 0.6 04/28/2021 1231   GFRNONAA >60 04/28/2021 1231   GFRNONAA 77 12/25/2020 1000   GFRAA 89 12/25/2020 1000    Lab Results  Component Value Date   ALBUMINELP 3.7 05/01/2023   MSPIKE Not Observed 05/01/2023    Lab Results  Component Value Date   WBC 5.6 05/15/2023   NEUTROABS 3.7 05/15/2023   HGB 13.0 05/15/2023   HCT 40.3 05/15/2023   MCV 99 (H) 05/15/2023   PLT 257 05/15/2023     No results found for: "LABCA2"  No components found for: "XLKGMW102"  No results for input(s): "INR" in the last 168 hours.  No results found for: "LABCA2"  No results found for: "VOZ366"  No results found for: "CAN125"  No results found for: "CAN153"  No results found for: "CA2729"  No components found for: "HGQUANT"  No results found for: "CEA1", "CEA" / No results found for: "CEA1", "CEA"   No results found for: "AFPTUMOR"  No results found for: "CHROMOGRNA"  No results found for: "KPAFRELGTCHN", "LAMBDASER", "KAPLAMBRATIO" (kappa/lambda light chains)  No results found for: "HGBA", "HGBA2QUANT", "HGBFQUANT", "HGBSQUAN" (Hemoglobinopathy evaluation)   No results found for: "LDH"  No results found for: "IRON", "TIBC", "IRONPCTSAT" (Iron and TIBC)  No results found for: "FERRITIN"  Urinalysis    Component Value Date/Time   BILIRUBINUR negative 03/28/2017 1622   KETONESUR negative 03/20/2014 1447   PROTEINUR negative 03/28/2017 1622   UROBILINOGEN 0.2 03/28/2017 1622   NITRITE positive 03/28/2017 1622   LEUKOCYTESUR Trace (A) 03/28/2017 1622   STUDIES: MM DIAG BREAST TOMO BILATERAL  Result Date: 05/18/2023 CLINICAL DATA:  Patient with history of right breast lumpectomy 06/13/2021. EXAM: DIGITAL DIAGNOSTIC BILATERAL MAMMOGRAM WITH TOMOSYNTHESIS AND CAD TECHNIQUE: Bilateral digital diagnostic mammography and breast tomosynthesis was performed. The images were evaluated with computer-aided detection. COMPARISON:  Previous exam(s). ACR Breast Density Category b: There are scattered areas of fibroglandular density. FINDINGS: Stable postlumpectomy changes right breast. No new masses, calcifications or nonsurgical distortion identified within either breast. IMPRESSION: No mammographic evidence  for malignancy. Stable lumpectomy changes right breast. RECOMMENDATION: Bilateral diagnostic mammography in 1 year. I have discussed the findings and recommendations with the  patient. If applicable, a reminder letter will be sent to the patient regarding the next appointment. BI-RADS CATEGORY  2: Benign. Electronically Signed   By: Annia Belt M.D.   On: 05/18/2023 12:03     ELIGIBLE FOR AVAILABLE RESEARCH PROTOCOL: no  ASSESSMENT/PLAN  This is a very pleasant 72 year old female patient with recently diagnosed right breast T1 a N0 M0 ER/PR strongly positive and HER2 negative breast cancer status post right lumpectomy, adjuvant radiation now on antiestrogen therapy with anastrozole who is here for follow-up.   She is tolerating anastrozole well. No complaints today Most recent mammogram 04/2023, No new evidence of new or recurrent breast carcinoma. No concerning findings on physical exam today.  Breast Cancer Post-radiation and surgical changes noted. No new lumps or abnormalities detected on physical examination. Patient reports tenderness and itching, likely due to scar tissue. Recent mammogram showed no abnormalities. -Continue Anastrozole as prescribed. -Continue annual mammograms.  Brain Atrophy Patient reports changes in memory and mood. Currently being treated with Aricept, which the patient reports is helping with memory and mood. -Continue Aricept as prescribed. -Consider sleep study and hearing evaluation as discussed.  General Health Maintenance Bone density is normal. Patient is encouraged to continue walking and consuming calcium-rich foods. -Continue walking and consuming calcium-rich foods. -Continue Vitamin D supplementation.   Total time spent: 30 minutes  *Total Encounter Time as defined by the Centers for Medicare and Medicaid Services includes, in addition to the face-to-face time of a patient visit (documented in the note above) non-face-to-face time: obtaining and reviewing outside history, ordering and reviewing medications, tests or procedures, care coordination (communications with other health care professionals or caregivers) and  documentation in the medical record.

## 2023-05-26 ENCOUNTER — Other Ambulatory Visit: Payer: Self-pay | Admitting: Physician Assistant

## 2023-05-26 DIAGNOSIS — L719 Rosacea, unspecified: Secondary | ICD-10-CM

## 2023-05-29 ENCOUNTER — Telehealth: Payer: Self-pay

## 2023-05-29 NOTE — Telephone Encounter (Signed)
Called patient to schedule her home sleep study. Patient said she spoke with her PCP because she had waited a long time to have one in our office. PCP ordered an HST that is to be mailed to her and that after she completes that she could coordinate follow up care with our office. I let the patient know this was incorrect and if she did complete a sleep study through another provider outside of our office we would not be able to see her back in our office after she completes the study. She asked how our office does home sleep studies and I explained our pick up and drop off process. The patient said she liked the convenience of having the study from her PCP mailed directly to her and would just complete that study. I reminded the patient again that if she did complete that sleep study she would not be able to return to our office for follow up care as Dr. Vickey Huger only reads sleep studies that she orders and are completed in our office, follow up care from the sleep study would have to be done through her PCP or another sleep provider outside of our office. Patient was aware and said she would like to cancel having a sleep study with [place].

## 2023-06-02 ENCOUNTER — Other Ambulatory Visit: Payer: Self-pay | Admitting: Physician Assistant

## 2023-06-02 DIAGNOSIS — L719 Rosacea, unspecified: Secondary | ICD-10-CM

## 2023-06-05 NOTE — Telephone Encounter (Signed)
Sorry Therapist, music, I was out of office last week. Yes she came to me because she had not heard anything. I guess we will keep it with me for now since that is what the patient is requesting.

## 2023-06-07 DIAGNOSIS — G4730 Sleep apnea, unspecified: Secondary | ICD-10-CM | POA: Diagnosis not present

## 2023-07-03 ENCOUNTER — Encounter: Payer: Self-pay | Admitting: Physician Assistant

## 2023-08-01 ENCOUNTER — Encounter: Payer: Self-pay | Admitting: Physician Assistant

## 2023-08-01 ENCOUNTER — Telehealth: Payer: Medicare PPO | Admitting: Physician Assistant

## 2023-08-01 ENCOUNTER — Ambulatory Visit: Payer: Medicare PPO | Admitting: Physician Assistant

## 2023-08-01 VITALS — Ht 61.0 in | Wt 179.0 lb

## 2023-08-01 DIAGNOSIS — L719 Rosacea, unspecified: Secondary | ICD-10-CM

## 2023-08-01 DIAGNOSIS — G4730 Sleep apnea, unspecified: Secondary | ICD-10-CM

## 2023-08-01 DIAGNOSIS — R509 Fever, unspecified: Secondary | ICD-10-CM

## 2023-08-01 DIAGNOSIS — R7303 Prediabetes: Secondary | ICD-10-CM

## 2023-08-01 DIAGNOSIS — G479 Sleep disorder, unspecified: Secondary | ICD-10-CM

## 2023-08-01 HISTORY — DX: Sleep apnea, unspecified: G47.30

## 2023-08-01 MED ORDER — METFORMIN HCL 500 MG PO TABS: 500.0000 mg | ORAL_TABLET | Freq: Two times a day (BID) | ORAL | 3 refills | Status: DC

## 2023-08-01 MED ORDER — METRONIDAZOLE 0.75 % EX LOTN: TOPICAL_LOTION | CUTANEOUS | 3 refills | Status: DC

## 2023-08-01 MED ORDER — TRAZODONE HCL 50 MG PO TABS: 50.0000 mg | ORAL_TABLET | Freq: Every day | ORAL | 3 refills | Status: DC

## 2023-08-01 NOTE — Progress Notes (Signed)
 ..Virtual Visit via Video Note  I connected with Janice Dudley on 08/02/23 at  1:00 PM EST by a video enabled telemedicine application and verified that I am speaking with the correct person using two identifiers.  Location: Patient: home Provider: clinic  .SABRAParticipating in visit:  Patient: Janice Dudley Provider: Vermell Bologna PA-C Provider in training: Vernell Gate PA-S   I discussed the limitations of evaluation and management by telemedicine and the availability of in person appointments. The patient expressed understanding and agreed to proceed.  History of Present Illness: Pt would like to go over home sleep test from [facility].   She does need some refills. She is doing well with medications.   She did have a low grade fever today. Her daughter has been sick. She has a tickle in her throat. She wonders what to do.     Active Ambulatory Problems    Diagnosis Date Noted   Hyperlipidemia    Fibrocystic breast 03/25/2014   Hypertriglyceridemia 04/15/2014   Morbid obesity (HCC) 04/15/2014   Abnormal weight gain 05/16/2014   Atypical pigmented skin lesion 03/02/2016   Post-menopausal 03/02/2016   No energy 06/22/2017   Trouble in sleeping 06/22/2017   Strain of abdominal muscle 09/28/2017   H/O osteopenia 11/29/2017   Left breast mass 01/14/2019   Hypotension 01/14/2019   Laceration of right lower extremity 01/14/2019   Mixed hyperlipidemia 05/26/2020   Subjective hearing change 05/26/2020   Primary osteoarthritis of both knees 05/27/2020   Elevated liver enzymes 12/28/2020   Malignant neoplasm of lower-outer quadrant of right breast of female, estrogen receptor positive (HCC) 04/23/2021   Family history of breast cancer 04/29/2021   Family history of uterine cancer 04/29/2021   Family history of prostate cancer 04/29/2021   Genetic testing 05/10/2021   Lower extremity edema 07/02/2021   Seborrheic dermatitis 07/02/2021   Malignant tumor of breast (HCC)  04/26/2021   Vertigo 05/04/2022   Jaw pain 05/04/2022   Trigger finger, left middle finger 09/05/2022   Mood changes 11/18/2022   Short-term memory loss 11/18/2022   Mild cognitive impairment 11/18/2022   Cerebral atrophy, mild (HCC) 01/24/2023   Anxiousness 01/31/2023   Depressed mood 01/31/2023   Family history of dementia 01/31/2023   History of right breast cancer 05/01/2023   Risk factors for obstructive sleep apnea 05/01/2023   Retrognathia 05/01/2023   Pre-diabetes 05/15/2023   Snoring 05/16/2023   Non-restorative sleep 05/16/2023   Class 2 obesity due to excess calories without serious comorbidity with body mass index (BMI) of 35.0 to 35.9 in adult 05/16/2023   Elevated MCV 05/16/2023   Mild sleep apnea 08/01/2023   Rosacea 08/01/2023   Resolved Ambulatory Problems    Diagnosis Date Noted   Sprain of ankle 05/16/2014   Obesity (BMI 30.0-34.9) 06/22/2017   Past Medical History:  Diagnosis Date   Allergy 1960s, 1970s, 2000   Arthritis    Breast cancer (HCC)    Cataract    Personal history of radiation therapy     Observations/Objective: No acute distress Normal mood and appearance   Assessment and Plan: SABRASABRADiagnoses and all orders for this visit:  Mild sleep apnea  Trouble in sleeping -     traZODone  (DESYREL ) 50 MG tablet; Take 1-2 tablets (50-100 mg total) by mouth at bedtime. 1 HOUR BEFORE BEDTIME AS NEEDED  Rosacea -     METRONIDAZOLE , TOPICAL, 0.75 % LOTN; APPLY TOPICALLY TO THE AFFECTED AREA IN THE MORNING AND AT BEDTIME  Pre-diabetes -  metFORMIN  (GLUCOPHAGE ) 500 MG tablet; Take 1 tablet (500 mg total) by mouth 2 (two) times daily with a meal.  Low grade fever   Declined CPAP Will try oral appliance with dentist  Refilled trazodone  for sleep  Discussed pre-diabetes Start metformin  bid  Metronidazole  for rosacea refilled  Today low grade fever Could be starting to fight a virus Zinc and vitamin C with rest and hydration Follow up as  needed   Follow Up Instructions:    I discussed the assessment and treatment plan with the patient. The patient was provided an opportunity to ask questions and all were answered. The patient agreed with the plan and demonstrated an understanding of the instructions.   The patient was advised to call back or seek an in-person evaluation if the symptoms worsen or if the condition fails to improve as anticipated.   Nasiya Pascual, PA-C

## 2023-08-02 ENCOUNTER — Encounter: Payer: Self-pay | Admitting: Physician Assistant

## 2023-10-17 ENCOUNTER — Other Ambulatory Visit: Payer: Self-pay | Admitting: Neurology

## 2023-10-30 ENCOUNTER — Encounter

## 2024-03-21 ENCOUNTER — Encounter: Payer: Self-pay | Admitting: Physician Assistant

## 2024-03-21 DIAGNOSIS — H9113 Presbycusis, bilateral: Secondary | ICD-10-CM

## 2024-03-22 DIAGNOSIS — H9113 Presbycusis, bilateral: Secondary | ICD-10-CM | POA: Insufficient documentation

## 2024-03-26 ENCOUNTER — Encounter: Payer: Self-pay | Admitting: Sports Medicine

## 2024-03-27 ENCOUNTER — Other Ambulatory Visit: Payer: Self-pay

## 2024-03-27 MED ORDER — DONEPEZIL HCL 10 MG PO TABS: 10.0000 mg | ORAL_TABLET | Freq: Every day | ORAL | 5 refills | Status: AC

## 2024-04-08 ENCOUNTER — Encounter: Payer: Self-pay | Admitting: Hematology and Oncology

## 2024-04-08 ENCOUNTER — Other Ambulatory Visit: Payer: Self-pay | Admitting: *Deleted

## 2024-04-08 DIAGNOSIS — C50511 Malignant neoplasm of lower-outer quadrant of right female breast: Secondary | ICD-10-CM

## 2024-04-29 ENCOUNTER — Encounter: Payer: Self-pay | Admitting: Physician Assistant

## 2024-04-29 ENCOUNTER — Ambulatory Visit (INDEPENDENT_AMBULATORY_CARE_PROVIDER_SITE_OTHER): Admitting: Physician Assistant

## 2024-04-29 VITALS — BP 110/65 | HR 71 | Ht 61.0 in | Wt 188.0 lb

## 2024-04-29 DIAGNOSIS — Z0000 Encounter for general adult medical examination without abnormal findings: Secondary | ICD-10-CM

## 2024-04-29 DIAGNOSIS — Z6835 Body mass index (BMI) 35.0-35.9, adult: Secondary | ICD-10-CM

## 2024-04-29 DIAGNOSIS — H9113 Presbycusis, bilateral: Secondary | ICD-10-CM

## 2024-04-29 DIAGNOSIS — R7303 Prediabetes: Secondary | ICD-10-CM

## 2024-04-29 DIAGNOSIS — E782 Mixed hyperlipidemia: Secondary | ICD-10-CM

## 2024-04-29 DIAGNOSIS — G4730 Sleep apnea, unspecified: Secondary | ICD-10-CM | POA: Diagnosis not present

## 2024-04-29 DIAGNOSIS — F03A0 Unspecified dementia, mild, without behavioral disturbance, psychotic disturbance, mood disturbance, and anxiety: Secondary | ICD-10-CM

## 2024-04-29 DIAGNOSIS — R011 Cardiac murmur, unspecified: Secondary | ICD-10-CM

## 2024-04-29 DIAGNOSIS — G319 Degenerative disease of nervous system, unspecified: Secondary | ICD-10-CM

## 2024-04-29 DIAGNOSIS — Z23 Encounter for immunization: Secondary | ICD-10-CM | POA: Diagnosis not present

## 2024-04-29 DIAGNOSIS — Z8249 Family history of ischemic heart disease and other diseases of the circulatory system: Secondary | ICD-10-CM

## 2024-04-29 DIAGNOSIS — E781 Pure hyperglyceridemia: Secondary | ICD-10-CM | POA: Diagnosis not present

## 2024-04-29 DIAGNOSIS — R718 Other abnormality of red blood cells: Secondary | ICD-10-CM

## 2024-04-29 DIAGNOSIS — G479 Sleep disorder, unspecified: Secondary | ICD-10-CM

## 2024-04-29 DIAGNOSIS — L719 Rosacea, unspecified: Secondary | ICD-10-CM

## 2024-04-29 DIAGNOSIS — G3184 Mild cognitive impairment, so stated: Secondary | ICD-10-CM

## 2024-04-29 LAB — POCT GLYCOSYLATED HEMOGLOBIN (HGB A1C): Hemoglobin A1C: 5.4 % (ref 4.0–5.6)

## 2024-04-29 MED ORDER — METFORMIN HCL 500 MG PO TABS: 500.0000 mg | ORAL_TABLET | Freq: Two times a day (BID) | ORAL | 4 refills | Status: AC

## 2024-04-29 MED ORDER — TRAZODONE HCL 50 MG PO TABS
50.0000 mg | ORAL_TABLET | Freq: Every day | ORAL | 4 refills | Status: AC
Start: 2024-04-29 — End: ?

## 2024-04-29 MED ORDER — ATORVASTATIN CALCIUM 40 MG PO TABS
40.0000 mg | ORAL_TABLET | Freq: Every day | ORAL | 4 refills | Status: AC
Start: 2024-04-29 — End: ?

## 2024-04-29 MED ORDER — METRONIDAZOLE 0.75 % EX LOTN
TOPICAL_LOTION | CUTANEOUS | 4 refills | Status: AC
Start: 2024-04-29 — End: ?

## 2024-04-29 NOTE — Patient Instructions (Addendum)
 Ordered fasting labs today. Will get CT coronary calcium  score ordered.  Will order echo.  Referral placed for audiology Flu and covid given today.  Ok to get shingles at pharmacy.   Health Maintenance After Age 72 After age 93, you are at a higher risk for certain long-term diseases and infections as well as injuries from falls. Falls are a major cause of broken bones and head injuries in people who are older than age 9. Getting regular preventive care can help to keep you healthy and well. Preventive care includes getting regular testing and making lifestyle changes as recommended by your health care provider. Talk with your health care provider about: Which screenings and tests you should have. A screening is a test that checks for a disease when you have no symptoms. A diet and exercise plan that is right for you. What should I know about screenings and tests to prevent falls? Screening and testing are the best ways to find a health problem early. Early diagnosis and treatment give you the best chance of managing medical conditions that are common after age 37. Certain conditions and lifestyle choices may make you more likely to have a fall. Your health care provider may recommend: Regular vision checks. Poor vision and conditions such as cataracts can make you more likely to have a fall. If you wear glasses, make sure to get your prescription updated if your vision changes. Medicine review. Work with your health care provider to regularly review all of the medicines you are taking, including over-the-counter medicines. Ask your health care provider about any side effects that may make you more likely to have a fall. Tell your health care provider if any medicines that you take make you feel dizzy or sleepy. Strength and balance checks. Your health care provider may recommend certain tests to check your strength and balance while standing, walking, or changing positions. Foot health exam. Foot  pain and numbness, as well as not wearing proper footwear, can make you more likely to have a fall. Screenings, including: Osteoporosis screening. Osteoporosis is a condition that causes the bones to get weaker and break more easily. Blood pressure screening. Blood pressure changes and medicines to control blood pressure can make you feel dizzy. Depression screening. You may be more likely to have a fall if you have a fear of falling, feel depressed, or feel unable to do activities that you used to do. Alcohol use screening. Using too much alcohol can affect your balance and may make you more likely to have a fall. Follow these instructions at home: Lifestyle Do not drink alcohol if: Your health care provider tells you not to drink. If you drink alcohol: Limit how much you have to: 0-1 drink a day for women. 0-2 drinks a day for men. Know how much alcohol is in your drink. In the [place] one drink equals one 12 oz bottle of beer (355 mL), one 5 oz glass of wine (148 mL), or one 1 oz glass of hard liquor (44 mL). Do not use any products that contain nicotine or tobacco. These products include cigarettes, chewing tobacco, and vaping devices, such as e-cigarettes. If you need help quitting, ask your health care provider. Activity  Follow a regular exercise program to stay fit. This will help you maintain your balance. Ask your health care provider what types of exercise are appropriate for you. If you need a cane or walker, use it as recommended by your health care provider. Wear supportive shoes  that have nonskid soles. Safety  Remove any tripping hazards, such as rugs, cords, and clutter. Install safety equipment such as grab bars in bathrooms and safety rails on stairs. Keep rooms and walkways well-lit. General instructions Talk with your health care provider about your risks for falling. Tell your health care provider if: You fall. Be sure to tell your health care provider about all  falls, even ones that seem minor. You feel dizzy, tiredness (fatigue), or off-balance. Take over-the-counter and prescription medicines only as told by your health care provider. These include supplements. Eat a healthy diet and maintain a healthy weight. A healthy diet includes low-fat dairy products, low-fat (lean) meats, and fiber from whole grains, beans, and lots of fruits and vegetables. Stay current with your vaccines. Schedule regular health, dental, and eye exams. Summary Having a healthy lifestyle and getting preventive care can help to protect your health and wellness after age 14. Screening and testing are the best way to find a health problem early and help you avoid having a fall. Early diagnosis and treatment give you the best chance for managing medical conditions that are more common for people who are older than age 13. Falls are a major cause of broken bones and head injuries in people who are older than age 21. Take precautions to prevent a fall at home. Work with your health care provider to learn what changes you can make to improve your health and wellness and to prevent falls. This information is not intended to replace advice given to you by your health care provider. Make sure you discuss any questions you have with your health care provider. Document Revised: 11/30/2020 Document Reviewed: 11/30/2020 Elsevier Patient Education  2024 [employer].

## 2024-04-29 NOTE — Progress Notes (Signed)
 "  Complete physical exam  Patient: Janice Dudley   DOB: March 14, 1951   73 y.o. Female  MRN: [id number]  Subjective:    Chief Complaint  Patient presents with   Annual Exam   Discussed the use of AI scribe software for clinical note transcription with the patient, who gave verbal consent to proceed.  History of Present Illness Janice Dudley Janice Dudley is a 73 year old female who presents for CPE and evaluation of cardiovascular risk.  Cognitive impairment and neuropsychiatric symptoms - Occasional memory lapses, including difficulty recalling names of long-term acquaintances - Preserved remote memory but impaired recent memory, requiring reminders - Currently taking Aricept , which has reduced anxiety and depression and eliminated episodes of 'zoning out' - No current use of Trintellix ; prescription was not renewed by neurology and not further discussed - Untreated sleep apnea acknowledged as a possible contributor to cognitive symptoms. She has not gotten her dental appliance yet.   Cardiovascular risk assessment - Family history of heart disease, with maternal relatives deceased from myocardial infarction - Expressed concern regarding personal cardiovascular risk - Advised by husband's cardiologist to inquire about coronary artery calcium  scoring due to family history  Weight gain and lifestyle modification - Gained approximately 10 pounds over the past four months [place] - Weight gain attributed to frequent dining out and decreased physical activity - Since returning home, dietary modifications include increased intake of lean proteins and vegetables, avoidance of red meat and excessive starches  Dyslipidemia - Currently taking atorvastatin  (Lipitor) 40 mg for cholesterol management - Most recent LDL cholesterol measured at 77 mg/dL  Prediabetes - Currently taking metformin  for prediabetes - Most recent hemoglobin  A1c measured at 5.4%  Obstructive sleep apnea - Previously tested positive for sleep apnea - No current treatment with CPAP or oral appliance due to change in dental providers     Most recent fall risk assessment:    04/30/2024    7:13 AM  Fall Risk   Falls in the past year? 0  Number falls in past yr: 0  Injury with Fall? 0  Risk for fall due to : No Fall Risks  Follow up Falls evaluation completed     Most recent depression screenings:    04/30/2024    7:13 AM 05/15/2023    1:58 PM  PHQ 2/9 Scores  PHQ - 2 Score 0 0    Vision:Within last year and Dental: No current dental problems and Receives regular dental care  Patient Active Problem List   Diagnosis Date Noted   Murmur, cardiac 04/29/2024   Family history of cardiovascular disease 04/29/2024   Presbycusis of both ears 03/22/2024   Mild sleep apnea 08/01/2023   Rosacea 08/01/2023   Snoring 05/16/2023   Non-restorative sleep 05/16/2023   Class 2 severe obesity due to excess calories with serious comorbidity and body mass index (BMI) of 35.0 to 35.9 in adult 05/16/2023   Elevated MCV 05/16/2023   Pre-diabetes 05/15/2023   History of right breast cancer 05/01/2023   Risk factors for obstructive sleep apnea 05/01/2023   Retrognathia 05/01/2023   Anxiousness 01/31/2023   Depressed mood 01/31/2023   Family history of dementia 01/31/2023   Cerebral atrophy, mild 01/24/2023   Mood changes 11/18/2022   Short-term memory loss 11/18/2022   Mild cognitive impairment 11/18/2022   Trigger finger, left middle finger 09/05/2022   Vertigo 05/04/2022   Jaw pain 05/04/2022   Lower extremity edema 07/02/2021  Seborrheic dermatitis 07/02/2021   Genetic testing 05/10/2021   Family history of breast cancer 04/29/2021   Family history of uterine cancer 04/29/2021   Family history of prostate cancer 04/29/2021   Malignant tumor of breast (HCC) 04/26/2021   Malignant neoplasm of lower-outer quadrant of right breast of  female, estrogen receptor positive (HCC) 04/23/2021   Elevated liver enzymes 12/28/2020   Primary osteoarthritis of both knees 05/27/2020   Mixed hyperlipidemia 05/26/2020   Subjective hearing change 05/26/2020   Left breast mass 01/14/2019   Hypotension 01/14/2019   Laceration of right lower extremity 01/14/2019   H/O osteopenia 11/29/2017   Strain of abdominal muscle 09/28/2017   No energy 06/22/2017   Trouble in sleeping 06/22/2017   Atypical pigmented skin lesion 03/02/2016   Post-menopausal 03/02/2016   Abnormal weight gain 05/16/2014   Hypertriglyceridemia 04/15/2014   Morbid obesity (HCC) 04/15/2014   Fibrocystic breast 03/25/2014   Hyperlipidemia    Past Medical History:  Diagnosis Date   Allergy 1960s, 58s, 2000   Anxiousness 01/31/2023   Arthritis    Breast cancer (HCC)    Cataract    no surgery yet   Genetic testing 05/10/2021   Hyperlipidemia    Mild sleep apnea 08/01/2023   Murmur, cardiac 04/29/2024   Personal history of radiation therapy    Family History  Problem Relation Age of Onset   Stroke Mother 38   Heart attack Mother 36   Cancer Mother 41       Uterine   Breast cancer Mother 89   Heart attack Father 59   Cancer Father 51       Lung   Stroke Father 34   Hyperlipidemia Sister    Breast cancer Sister 77   Cancer Sister 68   Hyperlipidemia Sister    Thyroid  nodules Brother    Hyperlipidemia Brother    Prostate cancer Brother 48   Cancer Brother    Hyperlipidemia Brother    Lung cancer Brother 71   Thyroid  cancer Brother        dx. early 31s   Cancer Brother    Hyperlipidemia Brother    Anxiety disorder Daughter    Depression Daughter    Colon cancer Neg Hx    Esophageal cancer Neg Hx    Rectal cancer Neg Hx    Allergies  Allergen Reactions   Peach [Prunus Persica]     Hives/itching   Sulfa Antibiotics Hives   Topamax  [Topiramate ]     Bilateral leg pain   Bioflavonoids Hives   Shellfish Allergy Hives and Other (See Comments)       Patient Care Team: Zaydrian Batta L, PA-C as PCP - General (Family Medicine) Tyree Nanetta SAILOR, RN as Oncology Nurse Navigator Vernetta Berg, MD as Consulting Physician (General Surgery) Dewey Rush, MD as Consulting Physician (Radiation Oncology) Latisha Medford, MD as Consulting Physician (Obstetrics and Gynecology) Curtis Debby PARAS, MD as Consulting Physician (Sports Medicine) Marleen Redell SAUNDERS, MD as Referring Physician (Specialist)   Outpatient Medications Prior to Visit  Medication Sig   albuterol  (VENTOLIN  HFA) 108 (90 Base) MCG/ACT inhaler Inhale 2 puffs into the lungs every 6 (six) hours as needed for wheezing or shortness of breath.   anastrozole  (ARIMIDEX ) 1 MG tablet TAKE 1 TABLET(1 MG) BY MOUTH DAILY   b complex vitamins tablet Take 1 tablet by mouth daily.   calcium  carbonate (TUMS EX) 750 MG chewable tablet Chew 1 tablet by mouth daily.   cholecalciferol (VITAMIN D ) 1000 UNITS tablet  Take 1,000 Units by mouth daily.   donepezil  (ARICEPT ) 10 MG tablet Take 1 tablet (10 mg total) by mouth at bedtime.   KRILL OIL PO Take by mouth.   Magnesium 250 MG TABS Take by mouth daily.    MELATONIN PO Take 10 mg by mouth at bedtime.    [DISCONTINUED] atorvastatin  (LIPITOR) 40 MG tablet Take 1 tablet (40 mg total) by mouth daily.   [DISCONTINUED] buPROPion  (WELLBUTRIN  SR) 150 MG 12 hr tablet Take 1 tablet (150 mg total) by mouth 2 (two) times daily.   [DISCONTINUED] hydrochlorothiazide  (HYDRODIURIL ) 12.5 MG tablet Take 1 tablet (12.5 mg total) by mouth daily.   [DISCONTINUED] metFORMIN  (GLUCOPHAGE ) 500 MG tablet Take 1 tablet (500 mg total) by mouth 2 (two) times daily with a meal.   [DISCONTINUED] METRONIDAZOLE , TOPICAL, 0.75 % LOTN APPLY TOPICALLY TO THE AFFECTED AREA IN THE MORNING AND AT BEDTIME   [DISCONTINUED] traZODone  (DESYREL ) 50 MG tablet Take 1-2 tablets (50-100 mg total) by mouth at bedtime. 1 HOUR BEFORE BEDTIME AS NEEDED   No facility-administered  medications prior to visit.    Review of Systems  All other systems reviewed and are negative.         Objective:     BP 110/65   Pulse 71   Ht 5' 1 (1.549 m)   Wt 188 lb (85.3 kg)   SpO2 99%   BMI 35.52 kg/m  BP Readings from Last 3 Encounters:  04/29/24 110/65  05/24/23 121/63  05/15/23 130/76   Wt Readings from Last 3 Encounters:  04/29/24 188 lb (85.3 kg)  08/01/23 179 lb (81.2 kg)  05/24/23 188 lb 3.2 oz (85.4 kg)      Physical Exam  BP 110/65   Pulse 71   Ht 5' 1 (1.549 m)   Wt 188 lb (85.3 kg)   SpO2 99%   BMI 35.52 kg/m   General Appearance:    Alert, cooperative, no distress, appears stated age  Head:    Normocephalic, without obvious abnormality, atraumatic  Eyes:    PERRL, conjunctiva/corneas clear, EOM's intact, fundi    benign, both eyes  Ears:    Normal TM's and external ear canals, both ears  Nose:   Nares normal, septum midline, mucosa normal, no drainage    or sinus tenderness  Throat:   Lips, mucosa, and tongue normal; teeth and gums normal  Neck:   Supple, symmetrical, trachea midline, no adenopathy;    thyroid :  no enlargement/tenderness/nodules; no carotid   bruit or JVD  Back:     Symmetric, no curvature, ROM normal, no CVA tenderness  Lungs:     Clear to auscultation bilaterally, respirations unlabored  Chest Wall:    No tenderness or deformity   Heart:    Regular rate and rhythm, S1 and S2 normal, 2/6 SEM murmur heard best of right intercostal space, rub   or gallop     Abdomen:     Soft, non-tender, bowel sounds active all four quadrants,    no masses, no organomegaly        Extremities:   Extremities normal, atraumatic, no cyanosis. No pitting edema around bilateral lateral ankles.   Pulses:   2+ and symmetric all extremities  Skin:   Skin color, texture, turgor normal, no rashes or lesions  Lymph nodes:   Cervical, supraclavicular, and axillary nodes normal  Neurologic:   CNII-XII intact, normal strength, sensation and  reflexes    throughout      Assessment &  Plan:    Routine Health Maintenance and Physical Exam  Immunization History  Administered Date(s) Administered   Fluad Trivalent(High Dose 65+) 05/15/2023   Hep B, Unspecified 09/06/2017   Hepatitis B 09/06/2017   INFLUENZA, HIGH DOSE SEASONAL PF 05/15/2019, 05/08/2022   Influenza, Seasonal, Injecte, Preservative Fre 04/29/2024   Influenza,inj,Quad PF,6+ Mos 04/11/2014   Influenza-Unspecified 06/09/2017, 05/18/2018, 04/25/2021   PFIZER(Purple Top)SARS-COV-2 Vaccination 08/28/2019, 09/18/2019, 04/24/2020   PPD Test 09/20/2013   Pfizer Covid-19 Vaccine Bivalent Booster 35yrs & up 06/24/2021   Pfizer(Comirnaty)Fall Seasonal Vaccine 12 years and older 04/29/2024   Pneumococcal Polysaccharide-23 01/09/2019   Pneumococcal-Unspecified 09/06/2017   Td 09/20/2013   Td (Adult),5 Lf Tetanus Toxid, Preservative Free 09/20/2013   Tdap 08/08/2013, 12/26/2018   Unspecified SARS-COV-2 Vaccination 08/19/2019, 09/09/2019, 04/24/2020   Zoster Recombinant(Shingrix) 04/25/2021   Zoster, Live 05/16/2014, 11/07/2017    Health Maintenance  Topic Date Due   Zoster Vaccines- Shingrix (2 of 2) 06/20/2021   Medicare Annual Wellness (AWV)  08/30/2023   Mammogram  05/17/2024   COVID-19 Vaccine (9 - Mixed Product risk 2024-25 season) 10/28/2024   Colonoscopy  05/23/2028   DTaP/Tdap/Td (5 - Td or Tdap) 12/25/2028   Influenza Vaccine  Completed   DEXA SCAN  Completed   Hepatitis C Screening  Completed   Meningococcal B Vaccine  Aged Out   Pneumococcal Vaccine: 50+ Years  Discontinued   Hepatitis B Vaccines 19-59 Average Risk  Discontinued    Discussed health benefits of physical activity, and encouraged her to engage in regular exercise appropriate for her age and condition. Janice Dudley was seen today for annual exam.  Diagnoses and all orders for this visit:  Encounter for annual physical exam -     CBC with Differential/Platelet -      CMP14+EGFR -     Lipid panel -     VITAMIN D  25 Hydroxy (Vit-D Deficiency, Fractures) -     TSH + free T4 -     B12 and Folate Panel  Mild sleep apnea -     CBC with Differential/Platelet -     CMP14+EGFR -     Lipid panel -     VITAMIN D  25 Hydroxy (Vit-D Deficiency, Fractures) -     TSH + free T4 -     B12 and Folate Panel  Elevated MCV -     CBC with Differential/Platelet  Pre-diabetes -     CMP14+EGFR -     metFORMIN  (GLUCOPHAGE ) 500 MG tablet; Take 1 tablet (500 mg total) by mouth 2 (two) times daily with a meal. -     POCT HgB A1C  Presbycusis of both ears -     Ambulatory referral to Audiology  Hypertriglyceridemia -     Lipid panel -     atorvastatin  (LIPITOR) 40 MG tablet; Take 1 tablet (40 mg total) by mouth daily.  Mild dementia without behavioral disturbance, psychotic disturbance, mood disturbance, or anxiety, unspecified dementia type (HCC) -     CBC with Differential/Platelet -     CMP14+EGFR -     Lipid panel -     VITAMIN D  25 Hydroxy (Vit-D Deficiency, Fractures) -     TSH + free T4 -     B12 and Folate Panel  Cerebral atrophy, mild -     CBC with Differential/Platelet -     CMP14+EGFR -     Lipid panel -     VITAMIN D  25 Hydroxy (Vit-D Deficiency, Fractures) -  TSH + free T4 -     B12 and Folate Panel  Trouble in sleeping -     traZODone  (DESYREL ) 50 MG tablet; Take 1-2 tablets (50-100 mg total) by mouth at bedtime. 1 HOUR BEFORE BEDTIME AS NEEDED  Rosacea -     METRONIDAZOLE , TOPICAL, 0.75 % LOTN; APPLY TOPICALLY TO THE AFFECTED AREA IN THE MORNING AND AT BEDTIME  Mixed hyperlipidemia -     atorvastatin  (LIPITOR) 40 MG tablet; Take 1 tablet (40 mg total) by mouth daily. -     CT CARDIAC SCORING (SELF PAY ONLY); Future  Murmur, cardiac -     EKG 12-Lead -     ECHOCARDIOGRAM COMPLETE; Future  Family history of cardiovascular disease -     EKG 12-Lead -     CT CARDIAC SCORING (SELF PAY ONLY); Future -     ECHOCARDIOGRAM COMPLETE;  Future  Immunization due -     Flu vaccine trivalent PF, 6mos and older(Flulaval,Afluria,Fluarix,Fluzone) -     Pfizer Comirnaty Covid -19 Vaccine 9yrs and older  Class 2 severe obesity due to excess calories with serious comorbidity and body mass index (BMI) of 35.0 to 35.9 in adult    Assessment & Plan Adult Wellness Visit Weight gain due to lifestyle changes. Focused on diet and regular physical activity. - Administer flu and COVID vaccines today. - Discuss shingles vaccine and advise obtaining it at the pharmacy due to Medicare coverage limitations.  Cognitive impairment Occasional memory lapses. Aricept  helpful for anxiety and depression and memory. Concern about microvascular changes. - Check cholesterol levels, aiming for LDL under 70. - Consider the impact of untreated sleep apnea on cognition. - managed by neurology  Obstructive sleep apnea Mild sleep apnea. Prefers oral appliance over CPAP or surgery. - Pursue oral appliance for sleep apnea management.  Prediabetes On metformin  with A1c at 5.4, indicating good control. - Continue metformin  as prescribed.  Hyperlipidemia On atorvastatin  40 mg. LDL at 77, close to target under 70. - Continue atorvastatin  40 mg. - Recheck cholesterol levels.  Hearing loss Worsening hearing noted by family. - referral to audiology  General Health Maintenance Family history of heart disease. Discussed cardiac health evaluation methods. - Order calcium  score to assess cardiac risk. - Order echocardiogram to evaluate heart valves and function. - Perform EKG to establish baseline cardiac function. No concerns found on EKG.     Return in about 6 months (around 10/28/2024).     Joliet Mallozzi, PA-C   "

## 2024-04-30 ENCOUNTER — Encounter: Payer: Self-pay | Admitting: Physician Assistant

## 2024-04-30 ENCOUNTER — Encounter (HOSPITAL_COMMUNITY): Payer: Self-pay | Admitting: Physician Assistant

## 2024-05-13 ENCOUNTER — Ambulatory Visit (INDEPENDENT_AMBULATORY_CARE_PROVIDER_SITE_OTHER): Payer: Self-pay

## 2024-05-13 ENCOUNTER — Ambulatory Visit: Payer: Self-pay | Admitting: Physician Assistant

## 2024-05-13 DIAGNOSIS — I340 Nonrheumatic mitral (valve) insufficiency: Secondary | ICD-10-CM

## 2024-05-13 DIAGNOSIS — E782 Mixed hyperlipidemia: Secondary | ICD-10-CM

## 2024-05-13 DIAGNOSIS — Z8249 Family history of ischemic heart disease and other diseases of the circulatory system: Secondary | ICD-10-CM

## 2024-05-13 DIAGNOSIS — R931 Abnormal findings on diagnostic imaging of heart and coronary circulation: Secondary | ICD-10-CM | POA: Insufficient documentation

## 2024-05-13 DIAGNOSIS — R011 Cardiac murmur, unspecified: Secondary | ICD-10-CM

## 2024-05-13 DIAGNOSIS — G4730 Sleep apnea, unspecified: Secondary | ICD-10-CM

## 2024-05-13 NOTE — Progress Notes (Signed)
 [name],   Your calcium  score is really elevated and you meet the requirements to be referred to cardiology for more evaluation. You are int he 96 percentile. Do you want to go where your husband goes? Remind me of his name Dr. Anner?

## 2024-05-17 ENCOUNTER — Ambulatory Visit (HOSPITAL_COMMUNITY)
Admission: RE | Admit: 2024-05-17 | Discharge: 2024-05-17 | Disposition: A | Discharge status: Home / Self Care | Source: Ambulatory Visit | Attending: Physician Assistant | Admitting: Physician Assistant

## 2024-05-17 DIAGNOSIS — Z8249 Family history of ischemic heart disease and other diseases of the circulatory system: Secondary | ICD-10-CM | POA: Insufficient documentation

## 2024-05-17 DIAGNOSIS — R011 Cardiac murmur, unspecified: Secondary | ICD-10-CM | POA: Insufficient documentation

## 2024-05-17 LAB — ECHOCARDIOGRAM COMPLETE
AR max vel: 5.37 cm2
AV Area VTI: 5.18 cm2
AV Area mean vel: 5.09 cm2
AV Mean grad: 2 mmHg
AV Peak grad: 5.2 mmHg
Ao pk vel: 1.14 m/s
Area-P 1/2: 3.83 cm2
Calc EF: 66.9 %
MV VTI: 4.36 cm2
S' Lateral: 2.9 cm
Single Plane A2C EF: 72 %
Single Plane A4C EF: 64.7 %

## 2024-05-17 NOTE — Progress Notes (Signed)
  Echocardiogram 2D Echocardiogram has been performed.  Norleen ORN Endia Moncur 05/17/2024, 1:27 PM

## 2024-05-20 ENCOUNTER — Ambulatory Visit
Admission: RE | Admit: 2024-05-20 | Discharge: 2024-05-20 | Disposition: A | Source: Ambulatory Visit | Attending: Hematology and Oncology

## 2024-05-20 ENCOUNTER — Ambulatory Visit
Admission: RE | Admit: 2024-05-20 | Discharge: 2024-05-20 | Disposition: A | Discharge status: Home / Self Care | Source: Ambulatory Visit | Attending: Hematology and Oncology | Admitting: Hematology and Oncology

## 2024-05-20 DIAGNOSIS — C50511 Malignant neoplasm of lower-outer quadrant of right female breast: Secondary | ICD-10-CM

## 2024-05-21 DIAGNOSIS — I340 Nonrheumatic mitral (valve) insufficiency: Secondary | ICD-10-CM | POA: Insufficient documentation

## 2024-05-21 NOTE — Progress Notes (Signed)
 [name],   Ejection Fraction is good at 65 to 70 percent.  Trace Mitral Valve Regurgitation, no concerns.

## 2024-05-23 ENCOUNTER — Telehealth: Payer: Self-pay

## 2024-05-23 NOTE — Telephone Encounter (Signed)
 Spoke with patient and confirmed appointment on 10/31

## 2024-05-24 ENCOUNTER — Inpatient Hospital Stay: Payer: Medicare PPO | Attending: Hematology and Oncology | Admitting: Hematology and Oncology

## 2024-05-24 VITALS — BP 123/73 | HR 74 | Temp 98.3°F | Resp 18 | Ht 61.0 in | Wt 187.6 lb

## 2024-05-24 DIAGNOSIS — Z87891 Personal history of nicotine dependence: Secondary | ICD-10-CM | POA: Insufficient documentation

## 2024-05-24 DIAGNOSIS — Z923 Personal history of irradiation: Secondary | ICD-10-CM | POA: Diagnosis not present

## 2024-05-24 DIAGNOSIS — Z1721 Progesterone receptor positive status: Secondary | ICD-10-CM | POA: Diagnosis not present

## 2024-05-24 DIAGNOSIS — C50511 Malignant neoplasm of lower-outer quadrant of right female breast: Secondary | ICD-10-CM | POA: Diagnosis not present

## 2024-05-24 DIAGNOSIS — Z170 Estrogen receptor positive status [ER+]: Secondary | ICD-10-CM | POA: Diagnosis not present

## 2024-05-24 DIAGNOSIS — Z1732 Human epidermal growth factor receptor 2 negative status: Secondary | ICD-10-CM | POA: Diagnosis not present

## 2024-05-24 DIAGNOSIS — D45 Polycythemia vera: Secondary | ICD-10-CM | POA: Diagnosis not present

## 2024-05-24 DIAGNOSIS — Z79811 Long term (current) use of aromatase inhibitors: Secondary | ICD-10-CM | POA: Diagnosis not present

## 2024-05-24 NOTE — Progress Notes (Signed)
 [facility]  Telephone:([phone] [phone]) [phone]    ID: Janice Dudley DOB: 22-Feb-1951  MR#: [id number]  RDW#:[id number]  Patient Care Team: Antoniette Vermell LITTIE DEVONNA as PCP - General (Family Medicine) Tyree Nanetta SAILOR, RN as Oncology Nurse Navigator Vernetta Berg, MD as Consulting Physician (General Surgery) Dewey Rush, MD as Consulting Physician (Radiation Oncology) Latisha Medford, MD as Consulting Physician (Obstetrics and Gynecology) Curtis Debby PARAS, MD (Inactive) as Consulting Physician (Sports Medicine) Marleen Redell SAUNDERS, MD as Referring Physician (Specialist) Amber Stalls, MD OTHER MD:  CHIEF COMPLAINT: Estrogen receptor positive breast cancer  CURRENT TREATMENT: Anastrazole.  INTERVAL HISTORY: Discussed the use of AI scribe software for clinical note transcription with the patient, who gave verbal consent to proceed.  History of Present Illness          HISTORY OF CURRENT ILLNESS:  Oncology History  Malignant neoplasm of lower-outer quadrant of right breast of female, estrogen receptor positive (HCC)  04/12/2021 Mammogram    She underwent bilateral diagnostic mammography with tomography and bilateral breast ultrasonography at [facility] on 04/12/2021 showing: breast density category B; new indeterminate 7 mm mass in right breast at 7 o'clock; no evidence of right axillary adenopathy; the likely-benign left breast mass at 4 o'clock is stable.   04/15/2021 Pathology Results    The pathology from this procedure ([id number]) showed: invasive ductal carcinoma, grade 2/3. Prognostic indicators significant for: estrogen receptor, >95% positive with strong staining intensity and progesterone receptor, 40% positive with moderate staining intensity. Proliferation marker Ki67 at 15%. HER2 equivocal by immunohistochemistry (2+), but negative by fluorescent in situ hybridization with a signals ratio 2.02 and 1.89 and number per cell  4.55 and 4.15}.  She also underwent biopsy of the left breast mass on 04/27/2021. Pathology ([id number]) was benign, showing fibrocystic changes including apocrine metaplasia.   04/23/2021 Initial Diagnosis   Malignant neoplasm of lower-outer quadrant of right breast of female, estrogen receptor positive (HCC)   04/28/2021 Cancer Staging   Staging form: Breast, AJCC 8th Edition - Clinical stage from 04/28/2021: Stage IA (cT1b, cN0, cM0, G3, ER+, PR+, HER2-) - Signed by Layla Sandria BROCKS, MD on 04/28/2021 Stage prefix: Initial diagnosis Histologic grading system: 3 grade system   05/05/2021 Genetic Testing   [facility] CustomNext Panel was negative. No pathogenic variants were identified in the 47 genes analyzed. Report date is 05/10/2021.   The CustomNext-Cancer+RNAinsight panel offered by [employer] [facility] includes sequencing and rearrangement analysis for the following 47 genes:  APC, ATM, AXIN2, BARD1, BMPR1A, BRCA1, BRCA2, BRIP1, CDH1, CDK4, CDKN2A, CHEK2, DICER1, EPCAM, GREM1, HOXB13, MEN1, MLH1, MSH2, MSH3, MSH6, MUTYH, NBN, NF1, NF2, NTHL1, PALB2, PMS2, POLD1, POLE, PTEN, RAD51C, RAD51D, RECQL, RET, SDHA, SDHAF2, SDHB, SDHC, SDHD, SMAD4, SMARCA4, STK11, TP53, TSC1, TSC2, and VHL.  RNA data is routinely analyzed for use in variant interpretation for all genes.    05/31/2021 Surgery   right lumpectomy with sentinel lymph node sampling 05/31/2021 showed a pT1a pN0, stage IA invasive ductal carcinoma, grade 2, with negative margins (a) a total of 3 right axillary lymph nodes were removed.    - 08/05/2021 Radiation Therapy   Completed adjuvant radiation    Discussed the use of AI scribe software for clinical note transcription with the patient, who gave verbal consent to proceed.  History of Present Illness Janice Dudley Janice Dudley is a 73 year old female with breast cancer who presents for a routine follow-up.   She has been on anastrozole  since 2023 for breast cancer  with no new issues  related to the medication.  She has concerns about her memory but has not noticed any personal issues. She is currently taking Aricept  for memory support. Her husband notes that hearing seems to be more of an issue than memory. She is awaiting an insurance change to address her hearing issues.  She reports experiencing some swelling in her ankles.  She recently underwent a bone density scan and has not yet received official results.  Rest of the pertinent 10 point ROS reviewed and neg.   PAST MEDICAL HISTORY: Past Medical History:  Diagnosis Date   Allergy 1960s, 64s, 2000   Anxiousness 01/31/2023   Arthritis    Breast cancer (HCC)    Cataract    no surgery yet   Genetic testing 05/10/2021   Hyperlipidemia    Mild sleep apnea 08/01/2023   Murmur, cardiac 04/29/2024   Personal history of radiation therapy     PAST SURGICAL HISTORY: Past Surgical History:  Procedure Laterality Date   BREAST EXCISIONAL BIOPSY Left 1999   BREAST LUMPECTOMY Right 05/31/2021   BREAST LUMPECTOMY WITH RADIOACTIVE SEED AND SENTINEL LYMPH NODE BIOPSY Right 05/31/2021   Procedure: RIGHT BREAST LUMPECTOMY WITH RADIOACTIVE SEED AND SENTINEL LYMPH NODE BIOPSY;  Surgeon: Vernetta Berg, MD;  Location: [facility];  Service: General;  Laterality: Right;   BREAST SURGERY  1999   Lumpectomy   CESAREAN SECTION  1982/1983   2 times   COLONOSCOPY  2008   EYE SURGERY  01/2021   Vitrectomy   LAPAROSCOPY     abd/tubal ligation   TUBAL LIGATION  1988   VITRECTOMY      FAMILY HISTORY: Family History  Problem Relation Age of Onset   Stroke Mother 54   Heart attack Mother 76   Cancer Mother 100       Uterine   Breast cancer Mother 26   Heart attack Father 64   Cancer Father 58       Lung   Stroke Father 49   Hyperlipidemia Sister    Breast cancer Sister 55   Cancer Sister 40   Hyperlipidemia Sister    Thyroid  nodules Brother    Hyperlipidemia Brother    Prostate cancer Brother 1    Cancer Brother    Hyperlipidemia Brother    Lung cancer Brother 32   Thyroid  cancer Brother        dx. early 68s   Cancer Brother    Hyperlipidemia Brother    Anxiety disorder Daughter    Depression Daughter    Colon cancer Neg Hx    Esophageal cancer Neg Hx    Rectal cancer Neg Hx    Her father died at age 57 from SAH. He had a history of lung cancer at age 43. Her mother died at age 79 from uterine cancer. She had a history of breast cancer at age 34 and apparently was treated with tamoxifen. Janice Dudley has three brothers and three sisters. One sister had breast and uterine cancer at age 60 (possibly also treated with tamoxifen), a brother had lung cancer at age 21, and another brother had prostate cancer at age 46.  There is no history of colon cancer in the family   GYNECOLOGIC HISTORY:  No LMP recorded. Patient is postmenopausal. Menarche: 73 years old Age at first live birth: 73 years old GX P 2 LMP 2006 Contraceptive: used for 12 years HRT: never used  Hysterectomy? no BSO? no   SOCIAL HISTORY: (  updated 04/2021)  Janice FORMOSA is currently retired from working as a runner, broadcasting/film/video for pre-K to [employer]. Husband Norleen is a retired emergency planning/management officer. Daughter Cheryl, age 69, is a haematologist in [place], [place]. Son Charlie, age 70, is an MRI tech in [place]. Janice Dudley has three grandchildren. She attends a [employer] in [place].    ADVANCED DIRECTIVES: in place   HEALTH MAINTENANCE: Social History   Tobacco Use   Smoking status: Former    Current packs/day: 0.00    Average packs/day: 0.3 packs/day for 20.0 years (5.0 ttl pk-yrs)    Types: Cigarettes    Start date: 05/09/1969    Quit date: 05/09/1989    Years since quitting: 35.0   Smokeless tobacco: Never  Vaping Use   Vaping status: Never Used  Substance Use Topics   Alcohol use: Yes    Alcohol/week: 1.0 - 2.0 standard drink of alcohol    Types: 1 - 2 Glasses of wine per week    Comment: wine    Drug use: No      Colonoscopy: 04/2018 (Dr. Teressa), recall 2029  PAP: 2021  Bone density: 2021   Allergies  Allergen Reactions   Peach [Prunus Persica]     Hives/itching   Sulfa Antibiotics Hives   Topamax  [Topiramate ]     Bilateral leg pain   Bioflavonoids Hives   Shellfish Allergy Hives and Other (See Comments)    Current Outpatient Medications  Medication Sig Dispense Refill   albuterol  (VENTOLIN  HFA) 108 (90 Base) MCG/ACT inhaler Inhale 2 puffs into the lungs every 6 (six) hours as needed for wheezing or shortness of breath. 25.5 g 0   anastrozole  (ARIMIDEX ) 1 MG tablet TAKE 1 TABLET(1 MG) BY MOUTH DAILY 90 tablet 4   atorvastatin  (LIPITOR) 40 MG tablet Take 1 tablet (40 mg total) by mouth daily. 90 tablet 4   b complex vitamins tablet Take 1 tablet by mouth daily.     calcium  carbonate (TUMS EX) 750 MG chewable tablet Chew 1 tablet by mouth daily.     cholecalciferol (VITAMIN D ) 1000 UNITS tablet Take 1,000 Units by mouth daily.     donepezil  (ARICEPT ) 10 MG tablet Take 1 tablet (10 mg total) by mouth at bedtime. 30 tablet 5   Magnesium 250 MG TABS Take by mouth daily.      MELATONIN PO Take 10 mg by mouth at bedtime.      metFORMIN  (GLUCOPHAGE ) 500 MG tablet Take 1 tablet (500 mg total) by mouth 2 (two) times daily with a meal. 180 tablet 4   METRONIDAZOLE , TOPICAL, 0.75 % LOTN APPLY TOPICALLY TO THE AFFECTED AREA IN THE MORNING AND AT BEDTIME 59 mL 4   traZODone  (DESYREL ) 50 MG tablet Take 1-2 tablets (50-100 mg total) by mouth at bedtime. 1 HOUR BEFORE BEDTIME AS NEEDED 180 tablet 4   No current facility-administered medications for this visit.    OBJECTIVE: White woman in no acute distress  Vitals:   05/24/24 1244  BP: 123/73  Pulse: 74  Resp: 18  Temp: 98.3 F (36.8 C)  SpO2: 100%      Body mass index is 35.45 kg/m.   Wt Readings from Last 3 Encounters:  05/24/24 187 lb 9.6 oz (85.1 kg)  04/29/24 188 lb (85.3 kg)  08/01/23 179 lb (81.2 kg)    ECOG FS:1 -  Symptomatic but completely ambulatory  Physical Exam Constitutional:      Appearance: Normal appearance.  Chest:     Comments:  Both breasts inspected and palpated. No palpable masses. No regional adenopathy Musculoskeletal:     Cervical back: Normal range of motion. No rigidity.  Lymphadenopathy:     Cervical: No cervical adenopathy.  Neurological:     Mental Status: She is alert.     LAB RESULTS:  CMP     Component Value Date/Time   NA 141 05/01/2023 1450   K 3.9 05/01/2023 1450   CL 100 05/01/2023 1450   CO2 22 05/01/2023 1450   GLUCOSE 93 05/01/2023 1450   GLUCOSE 84 11/01/2022 1418   BUN 14 05/01/2023 1450   CREATININE 0.89 05/01/2023 1450   CREATININE 0.89 11/01/2022 1418   CALCIUM  9.6 05/01/2023 1450   PROT 6.3 05/01/2023 1450   ALBUMIN 4.1 05/01/2023 1450   AST 18 05/01/2023 1450   AST 20 04/28/2021 1231   ALT 18 05/01/2023 1450   ALT 16 04/28/2021 1231   ALKPHOS 115 05/01/2023 1450   BILITOT 0.3 05/01/2023 1450   BILITOT 0.6 04/28/2021 1231   GFRNONAA >60 04/28/2021 1231   GFRNONAA 77 12/25/2020 1000   GFRAA 89 12/25/2020 1000    Lab Results  Component Value Date   ALBUMINELP 3.7 05/01/2023   MSPIKE Not Observed 05/01/2023    Lab Results  Component Value Date   WBC 5.6 05/15/2023   NEUTROABS 3.7 05/15/2023   HGB 13.0 05/15/2023   HCT 40.3 05/15/2023   MCV 99 (H) 05/15/2023   PLT 257 05/15/2023    No results found for: LABCA2  No components found for: [id number]  No results for input(s): INR in the last 168 hours.  No results found for: LABCA2  No results found for: RJW800  No results found for: CAN125  No results found for: CAN153  No results found for: CA2729  No components found for: HGQUANT  No results found for: CEA1, CEA / No results found for: CEA1, CEA   No results found for: AFPTUMOR  No results found for: CHROMOGRNA  No results found for: KPAFRELGTCHN, LAMBDASER,  KAPLAMBRATIO (kappa/lambda light chains)  No results found for: HGBA, HGBA2QUANT, HGBFQUANT, HGBSQUAN (Hemoglobinopathy evaluation)   No results found for: LDH  No results found for: IRON, TIBC, IRONPCTSAT (Iron and TIBC)  No results found for: FERRITIN  Urinalysis    Component Value Date/Time   BILIRUBINUR negative 03/28/2017 1622   KETONESUR negative 03/20/2014 1447   PROTEINUR negative 03/28/2017 1622   UROBILINOGEN 0.2 03/28/2017 1622   NITRITE positive 03/28/2017 1622   LEUKOCYTESUR Trace (A) 03/28/2017 1622   STUDIES: MM DIAG BREAST TOMO BILATERAL Result Date: 05/20/2024 CLINICAL DATA:  73 year old woman status post malignant RIGHT lumpectomy in 2022, presents for annual BILATERAL mammogram. She reports a new palpable lump in the outer RIGHT breast and new onset areolar skin indentation, beginning approximately 1 month ago. EXAM: DIGITAL DIAGNOSTIC BILATERAL MAMMOGRAM WITH TOMOSYNTHESIS AND CAD; ULTRASOUND RIGHT BREAST LIMITED TECHNIQUE: Bilateral digital diagnostic mammography and breast tomosynthesis was performed. The images were evaluated with computer-aided detection. ; Targeted ultrasound examination of the right breast was performed COMPARISON:  Previous exam(s). ACR Breast Density Category b: There are scattered areas of fibroglandular density. FINDINGS: RIGHT: Mammogram: Postlumpectomy changes again seen in the central RIGHT breast. No suspicious mass, distortion, or microcalcifications are identified to suggest presence of malignancy. Physical examination: On focused examination, no discrete palpable mass is identified in the outer RIGHT breast. There is mild indentation of the skin in the lower outer areola, proximally 2 CMFN. Ultrasound: Targeted sonographic evaluation of the  RIGHT breast was performed. No sonographic abnormality was identified at the area of palpable concern or at the site of areolar skin indentation. LEFT: Mammogram: No suspicious  mass, distortion, or microcalcifications are identified to suggest presence of malignancy. IMPRESSION: No evidence of RIGHT or LEFT breast malignancy. RECOMMENDATION: 1. Per protocol, as the patient is now 2 or more years status post lumpectomy, she may return to annual screening mammography in 1 year. However, given the history of breast cancer, the patient remains eligible for annual diagnostic mammography if preferred. 2. Any further workup of the patient's symptoms should be based on the clinical assessment. I have discussed the findings and recommendations with the patient. If applicable, a reminder letter will be sent to the patient regarding the next appointment. BI-RADS CATEGORY  2: Benign. Electronically Signed   By: Aliene Lloyd M.D.   On: 05/20/2024 14:51   US  LIMITED ULTRASOUND INCLUDING AXILLA RIGHT BREAST Result Date: 05/20/2024 CLINICAL DATA:  73 year old woman status post malignant RIGHT lumpectomy in 2022, presents for annual BILATERAL mammogram. She reports a new palpable lump in the outer RIGHT breast and new onset areolar skin indentation, beginning approximately 1 month ago. EXAM: DIGITAL DIAGNOSTIC BILATERAL MAMMOGRAM WITH TOMOSYNTHESIS AND CAD; ULTRASOUND RIGHT BREAST LIMITED TECHNIQUE: Bilateral digital diagnostic mammography and breast tomosynthesis was performed. The images were evaluated with computer-aided detection. ; Targeted ultrasound examination of the right breast was performed COMPARISON:  Previous exam(s). ACR Breast Density Category b: There are scattered areas of fibroglandular density. FINDINGS: RIGHT: Mammogram: Postlumpectomy changes again seen in the central RIGHT breast. No suspicious mass, distortion, or microcalcifications are identified to suggest presence of malignancy. Physical examination: On focused examination, no discrete palpable mass is identified in the outer RIGHT breast. There is mild indentation of the skin in the lower outer areola, proximally 2 CMFN.  Ultrasound: Targeted sonographic evaluation of the RIGHT breast was performed. No sonographic abnormality was identified at the area of palpable concern or at the site of areolar skin indentation. LEFT: Mammogram: No suspicious mass, distortion, or microcalcifications are identified to suggest presence of malignancy. IMPRESSION: No evidence of RIGHT or LEFT breast malignancy. RECOMMENDATION: 1. Per protocol, as the patient is now 2 or more years status post lumpectomy, she may return to annual screening mammography in 1 year. However, given the history of breast cancer, the patient remains eligible for annual diagnostic mammography if preferred. 2. Any further workup of the patient's symptoms should be based on the clinical assessment. I have discussed the findings and recommendations with the patient. If applicable, a reminder letter will be sent to the patient regarding the next appointment. BI-RADS CATEGORY  2: Benign. Electronically Signed   By: Aliene Lloyd M.D.   On: 05/20/2024 14:51   ECHOCARDIOGRAM COMPLETE Result Date: 05/17/2024    ECHOCARDIOGRAM REPORT   Patient Name:   Janice Dudley Date of Exam: 05/17/2024 Medical Rec #:  [id number]                Height:       61.0 in Accession #:    [id number]               Weight:       188.0 lb Date of Birth:  1950/09/24                BSA:          1.840 m Patient Age:    73 years  BP:           110/65 mmHg Patient Gender: F                        HR:           73 bpm. Exam Location:  Outpatient Procedure: 2D Echo (Both Spectral and Color Flow Doppler were utilized during            procedure). Indications:    Murmur  History:        Patient has no prior history of Echocardiogram examinations.                 Signs/Symptoms:Murmur.  Sonographer:    Norleen Amour Referring Phys: [phone] JADE L BREEBACK IMPRESSIONS  1. Left ventricular ejection fraction, by estimation, is 65 to 70%. Left ventricular ejection fraction by 2D MOD biplane is  66.9 %. The left ventricle has normal function. The left ventricle has no regional wall motion abnormalities. Left ventricular diastolic parameters were normal.  2. Right ventricular systolic function is normal. The right ventricular size is normal. Tricuspid regurgitation signal is inadequate for assessing PA pressure.  3. The mitral valve is grossly normal. Trivial mitral valve regurgitation.  4. The aortic valve is tricuspid. Aortic valve regurgitation is not visualized.  5. The inferior vena cava is normal in size with greater than 50% respiratory variability, suggesting right atrial pressure of 3 mmHg. Comparison(s): No prior Echocardiogram. FINDINGS  Left Ventricle: Left ventricular ejection fraction, by estimation, is 65 to 70%. Left ventricular ejection fraction by 2D MOD biplane is 66.9 %. The left ventricle has normal function. The left ventricle has no regional wall motion abnormalities. The left ventricular internal cavity size was normal in size. There is no left ventricular hypertrophy. Left ventricular diastolic parameters were normal. Right Ventricle: The right ventricular size is normal. No increase in right ventricular wall thickness. Right ventricular systolic function is normal. Tricuspid regurgitation signal is inadequate for assessing PA pressure. Left Atrium: Left atrial size was normal in size. Right Atrium: Right atrial size was normal in size. Pericardium: There is no evidence of pericardial effusion. Mitral Valve: The mitral valve is grossly normal. Trivial mitral valve regurgitation. MV peak gradient, 4.5 mmHg. The mean mitral valve gradient is 1.0 mmHg. Tricuspid Valve: The tricuspid valve is normal in structure. Tricuspid valve regurgitation is not demonstrated. Aortic Valve: The aortic valve is tricuspid. Aortic valve regurgitation is not visualized. Aortic valve mean gradient measures 2.0 mmHg. Aortic valve peak gradient measures 5.2 mmHg. Aortic valve area, by VTI measures 5.18 cm.  Pulmonic Valve: The pulmonic valve was normal in structure. Pulmonic valve regurgitation is not visualized. Aorta: The aortic root and ascending aorta are structurally normal, with no evidence of dilitation. Venous: The inferior vena cava is normal in size with greater than 50% respiratory variability, suggesting right atrial pressure of 3 mmHg. IAS/Shunts: No atrial level shunt detected by color flow Doppler.  LEFT VENTRICLE PLAX 2D                        Biplane EF (MOD) LVIDd:         4.90 cm         LV Biplane EF:   Left LVIDs:         2.90 cm  ventricular LV PW:         0.90 cm                          ejection LV IVS:        0.80 cm                          fraction by LVOT diam:     2.70 cm                          2D MOD LV SV:         135                              biplane is LV SV Index:   73                               66.9 %. LVOT Area:     5.73 cm LV IVRT:       74 msec         Diastology                                LV e' medial:    9.25 cm/s                                LV E/e' medial:  11.2 LV Volumes (MOD)               LV e' lateral:   11.00 cm/s LV vol d, MOD    49.6 ml       LV E/e' lateral: 9.5 A2C: LV vol d, MOD    68.0 ml A4C: LV vol s, MOD    13.9 ml A2C: LV vol s, MOD    24.0 ml A4C: LV SV MOD A2C:   35.7 ml LV SV MOD A4C:   68.0 ml LV SV MOD BP:    40.6 ml RIGHT VENTRICLE             IVC RV Basal diam:  3.00 cm     IVC diam: 1.80 cm RV S prime:     11.60 cm/s TAPSE (M-mode): 2.2 cm      PULMONARY VEINS                             Diastolic Velocity: 53.10 cm/s                             S/D Velocity:       1.10                             Systolic Velocity:  60.80 cm/s LEFT ATRIUM             Index        RIGHT ATRIUM           Index LA diam:        3.20 cm 1.74 cm/m   RA Area:  11.90 cm LA Vol (A2C):   39.4 ml 21.42 ml/m  RA Volume:   24.80 ml  13.48 ml/m LA Vol (A4C):   40.8 ml 22.18 ml/m LA Biplane Vol: 41.4 ml 22.50 ml/m  AORTIC VALVE                     PULMONIC VALVE AV Area (Vmax):    5.37 cm     PV Vmax:       0.72 m/s AV Area (Vmean):   5.09 cm     PV Peak grad:  2.1 mmHg AV Area (VTI):     5.18 cm AV Vmax:           114.00 cm/s AV Vmean:          72.300 cm/s AV VTI:            0.260 m AV Peak Grad:      5.2 mmHg AV Mean Grad:      2.0 mmHg LVOT Vmax:         107.00 cm/s LVOT Vmean:        64.300 cm/s LVOT VTI:          0.235 m LVOT/AV VTI ratio: 0.90  AORTA Ao Root diam: 3.10 cm Ao Asc diam:  3.00 cm MITRAL VALVE MV Area (PHT): 3.83 cm     SHUNTS MV Area VTI:   4.36 cm     Systemic VTI:  0.24 m MV Peak grad:  4.5 mmHg     Systemic Diam: 2.70 cm MV Mean grad:  1.0 mmHg MV Vmax:       1.06 m/s MV Vmean:      54.0 cm/s MV Decel Time: 198 msec MV E velocity: 104.00 cm/s MV A velocity: 81.00 cm/s MV E/A ratio:  1.28 Vinie Maxcy MD Electronically signed by Vinie Maxcy MD Signature Date/Time: 05/17/2024/3:56:54 PM    Final    CT CARDIAC SCORING (SELF PAY ONLY) Addendum Date: 05/17/2024 ADDENDUM REPORT: 05/17/2024 00:44 EXAM: OVER-READ INTERPRETATION  CT CHEST The following report is an over-read performed by radiologist Dr. Oneil Devonshire of [facility] on 05/17/2024. This over-read does not include interpretation of cardiac or coronary anatomy or pathology. The coronary calcium  score interpretation by the cardiologist is attached. COMPARISON:  None. FINDINGS: Cardiovascular: Mild atherosclerotic calcifications of the aorta are noted. No aneurysmal dilatation is seen. Mediastinum/Nodes: There are no enlarged lymph nodes within the visualized mediastinum. Lungs/Pleura: There is no pleural effusion. The visualized lungs appear clear. Upper abdomen: No significant findings in the visualized upper abdomen. Musculoskeletal/Chest wall: No chest wall mass or suspicious osseous findings within the visualized chest. IMPRESSION: Aortic Atherosclerosis (ICD10-I70.0). Electronically Signed   By: Oneil Devonshire M.D.   On: 05/17/2024 00:44   Result  Date: 05/17/2024 CLINICAL DATA:  Risk stratification: 73 Year-old Female EXAM: Coronary Calcium  Score TECHNIQUE: A gated, non-contrast computed tomography scan of the heart was performed using 2.5 mm slice thickness. Axial images were analyzed on a dedicated workstation. Calcium  scoring of the coronary arteries was performed using the Agatston method. FINDINGS: Coronary Calcium  Score: Left main: 1 Left anterior descending artery: 537 Left circumflex artery: 284 Right coronary artery: 161 Total: 984 Percentile: 96th Ascending Aorta: Normal caliber. Aortic atherosclerosis. Aortic Valve Calcium  score: 0 Mitral annular calcification: 0 Pericardium: Normal. Non-cardiac: See separate report from [facility]. IMPRESSION: 1. Coronary calcium  score of 984. This was 96th percentile for age-, race-, and sex-matched controls. RECOMMENDATIONS: Coronary artery calcium  (CAC) score  is a strong predictor of incident coronary heart disease (CHD) and provides predictive information beyond traditional risk factors. CAC scoring is reasonable to use in the decision to withhold, postpone, or initiate statin therapy in intermediate-risk or selected borderline-risk asymptomatic adults (age 57-75 years and LDL-C >=70 to <190 mg/dL) who do not have diabetes or established atherosclerotic cardiovascular disease (ASCVD).* In intermediate-risk (10-year ASCVD risk >=7.5% to <20%) adults or selected borderline-risk (10-year ASCVD risk >=5% to <7.5%) adults in whom a CAC score is measured for the purpose of making a treatment decision the following recommendations have been made: If CAC=0, it is reasonable to withhold statin therapy and reassess in 5 to 10 years, as long as higher risk conditions are absent (diabetes mellitus, family history of premature CHD in first degree relatives (males <55 years; females <65 years), cigarette smoking, or LDL >=190 mg/dL). If CAC is 1 to 99, it is reasonable to initiate statin therapy for patients  >=88 years of age. If CAC is >=100 or >=75th percentile, it is reasonable to initiate statin therapy at any age. Cardiology referral should be considered for patients with CAC scores >=400 or >=75th percentile. *2018 AHA/ACC/AACVPR/AAPA/ABC/ACPM/ADA/AGS/APhA/ASPC/NLA/PCNA Guideline on the Management of Blood Cholesterol: A Report of the American College of Cardiology/American Heart Association Task Force on Clinical Practice Guidelines. J Am Coll Cardiol. 2019;73(24):3168-3209. Stanly Leavens, MD Electronically Signed: By: Stanly Leavens M.D. On: 05/13/2024 12:05     ELIGIBLE FOR AVAILABLE RESEARCH PROTOCOL: no  ASSESSMENT/PLAN  This is a very pleasant 73 year old female patient with recently diagnosed right breast T1 a N0 M0 ER/PR strongly positive and HER2 negative breast cancer status post right lumpectomy, adjuvant radiation now on antiestrogen therapy with anastrozole  who is here for follow-up.    Assessment and Plan Assessment & Plan Breast cancer Breast cancer well-managed with anastrozole . No new issues reported. Recent imaging normal, no recurrence. - Continue anastrozole  for five years, two and a half years remaining. - Order screening mammogram for October next year. - Request recent [facility] density [facility] from [facility]. - Refill anastrozole  prescription at same pharmacy. - RTC in 1 yr or sooner as needed.   Total time spent: 30 minutes  *Total Encounter Time as defined by the Centers for Medicare and Medicaid Services includes, in addition to the face-to-face time of a patient visit (documented in the note above) non-face-to-face time: obtaining and reviewing outside history, ordering and reviewing medications, tests or procedures, care coordination (communications with other health care professionals or caregivers) and documentation in the medical record.

## 2024-06-12 ENCOUNTER — Ambulatory Visit (INDEPENDENT_AMBULATORY_CARE_PROVIDER_SITE_OTHER): Admitting: Physician Assistant

## 2024-06-12 ENCOUNTER — Encounter: Payer: Self-pay | Admitting: Obstetrics and Gynecology

## 2024-06-12 ENCOUNTER — Encounter: Payer: Self-pay | Admitting: Physician Assistant

## 2024-06-12 VITALS — BP 135/72 | HR 70 | Ht 61.0 in | Wt 187.0 lb

## 2024-06-12 DIAGNOSIS — F03A0 Unspecified dementia, mild, without behavioral disturbance, psychotic disturbance, mood disturbance, and anxiety: Secondary | ICD-10-CM

## 2024-06-12 MED ORDER — SERTRALINE HCL 25 MG PO TABS: 25.0000 mg | ORAL_TABLET | Freq: Every day | ORAL | 1 refills | Status: DC

## 2024-06-12 NOTE — Patient Instructions (Addendum)
 Start sertaline at bedtime daily.  Ok to take 1-3 tablets of trazodone  for sleep.

## 2024-06-24 ENCOUNTER — Encounter: Payer: Self-pay | Admitting: Physician Assistant

## 2024-06-24 NOTE — Progress Notes (Signed)
 Established Patient Office Visit  Subjective   Patient ID: Janice Dudley, female    DOB: Dec 12, 1950  Age: 73 y.o. MRN: [id number]  Chief Complaint  Patient presents with   Medical Management of Chronic Issues    Med consult    HPI .Discussed the use of AI scribe software for clinical note transcription with the patient, who gave verbal consent to proceed.  History of Present Illness Janice Dudley is a 73 year old female with cognitive decline who presents with increased irritability and social difficulties. She is accompanied by her husband, Norleen.  Cognitive decline and neuropsychiatric symptoms - Progressive cognitive decline over the past year - Increased irritability, particularly during social gatherings - Episodes of feeling overwhelmed and frustrated, especially in larger group settings - Notable incident last week during a birthday social event, felt ignored and isolated due to seating arrangements and conversation dynamics - No physical aggression during episodes of irritability - Difficulty with hearing and processing conversations in social settings  Functional impairment - Increased difficulty managing previously routine tasks at home, such as preparing dinner - Requires breaks during tasks due to feeling overwhelmed - Easily overwhelmed by interactions, including with her pet  Sleep disturbance - Sleep remains unrestful despite use of trazodone  and melatonin  Medication therapy - Currently taking Aricept  10 mg, increased from 5 mg by neurologist - Uses trazodone  and melatonin for sleep    ROS See HPI.    Objective:     BP 135/72   Pulse 70   Ht 5' 1 (1.549 m)   Wt 187 lb (84.8 kg)   SpO2 99%   BMI 35.33 kg/m  BP Readings from Last 3 Encounters:  06/13/24 135/72  05/24/24 123/73  04/29/24 110/65   Wt Readings from Last 3 Encounters:  06/13/24 187 lb (84.8 kg)  05/24/24 187 lb 9.6 oz (85.1 kg)  04/29/24 188 lb  (85.3 kg)      Physical Exam Constitutional:      Appearance: Normal appearance.  HENT:     Head: Normocephalic.  Cardiovascular:     Rate and Rhythm: Normal rate and regular rhythm.  Pulmonary:     Effort: Pulmonary effort is normal.  Musculoskeletal:     Right lower leg: No edema.     Left lower leg: No edema.  Neurological:     General: No focal deficit present.     Mental Status: She is alert.  Psychiatric:        Mood and Affect: Mood normal.      The 10-year ASCVD risk score (Arnett DK, et al., 2019) is: 12.1%    Assessment & Plan:  .Naomie FORMOSA was seen today for medical management of chronic issues.  Diagnoses and all orders for this visit:  Mild dementia without behavioral disturbance, psychotic disturbance, mood disturbance, or anxiety, unspecified dementia type (HCC) -     sertraline  (ZOLOFT ) 25 MG tablet; Take 1 tablet (25 mg total) by mouth daily.   Assessment & Plan Mild dementia complicated by irritability and anxiety Mild dementia with increasing irritability and anxiety, exacerbated in social settings. Stress and cognitive decline contribute to symptoms. Current medication includes Aricept  10 mg. Consideration of additional medication for symptom management and potential benefits of Namenda. - Started daily medication for irritability and anxiety,  Zoloft  25 mg at bedtime. - Will consider adding Namenda to Aricept  regimen; will consult neurology for approval. - Encouraged non-pharmacological strategies to manage social stressors, such as adjusting group dynamics  and reducing group size. - Ok to increase Trazodone  to 3 tablets at bedtime to help with sleep.   Spent 50 minutes with patient and patients husband discussing symptoms and treatment intervention.    Return in about 2 months (around 08/12/2024), or if symptoms worsen or fail to improve.    Torey Regan, PA-C

## 2024-07-14 ENCOUNTER — Other Ambulatory Visit: Payer: Self-pay | Admitting: Hematology and Oncology

## 2024-08-12 ENCOUNTER — Ambulatory Visit: Admitting: Physician Assistant

## 2024-08-12 VITALS — BP 134/67 | HR 65 | Ht 61.0 in | Wt 192.0 lb

## 2024-08-12 DIAGNOSIS — F03A0 Unspecified dementia, mild, without behavioral disturbance, psychotic disturbance, mood disturbance, and anxiety: Secondary | ICD-10-CM

## 2024-08-12 DIAGNOSIS — G4730 Sleep apnea, unspecified: Secondary | ICD-10-CM

## 2024-08-12 DIAGNOSIS — E782 Mixed hyperlipidemia: Secondary | ICD-10-CM

## 2024-08-12 DIAGNOSIS — F03A11 Unspecified dementia, mild, with agitation: Secondary | ICD-10-CM

## 2024-08-12 DIAGNOSIS — R931 Abnormal findings on diagnostic imaging of heart and coronary circulation: Secondary | ICD-10-CM

## 2024-08-12 MED ORDER — SERTRALINE HCL 25 MG PO TABS: 25.0000 mg | ORAL_TABLET | Freq: Every day | ORAL | 1 refills | Status: AC

## 2024-08-12 NOTE — Progress Notes (Unsigned)
 "  Established Patient Office Visit  Subjective   Patient ID: Chessie Neuharth, female    DOB: Nov 23, 1950  Age: 74 y.o. MRN: [id number]  Chief Complaint  Patient presents with   Medical Management of Chronic Issues    HPI Discussed the use of AI scribe software for clinical note transcription with the patient, who gave verbal consent to proceed.  History of Present Illness Janice Dudley is a 74 year old female with cognitive decline who presents for follow-up on her mood and social interactions. Her husband accompanies her.   Cognitive impairment - No significant progression of cognitive decline since previous visit - Difficulty with word recall and performing tasks such as drawing a clock - Engages in cognitive activities including crossword puzzles - Currently taking Aricept   Agitation and mood disturbance - Agitation occurs primarily in social settings - Improvement in agitation since initiation of sertraline  25 mg daily - No increase in sertraline  dosage - Sertraline  is effective in managing agitation related to social triggers  Social functioning and preferences - Continues to participate in social activities - Prefers mixed-gender seating arrangements at gatherings, which can lead to frustration - Comfortable remaining at home while husband attends social events - Home environment is quiet and comfortable  Support system and safety - Husband occasionally leaves her alone for several days - Receives regular check-ins from her daughter, Cheryl GLENWOOD Brand comfort and no concern regarding being left alone    ROS See HPI.    Objective:     BP 134/67   Pulse 65   Ht 5' 1 (1.549 m)   Wt 192 lb (87.1 kg)   SpO2 98%   BMI 36.28 kg/m  BP Readings from Last 3 Encounters:  08/12/24 134/67  06/13/24 135/72  05/24/24 123/73   Wt Readings from Last 3 Encounters:  08/12/24 192 lb (87.1 kg)  06/13/24 187 lb (84.8 kg)  05/24/24 187 lb 9.6  oz (85.1 kg)    ..    08/12/2024   10:47 AM 05/01/2023    1:51 PM 01/31/2023    1:49 PM 01/31/2023    1:40 PM 11/01/2022    2:03 PM  Montreal Cognitive Assessment   Visuospatial/ Executive (0/5) 4 4 0 -- 4  Naming (0/3) 3 3 3  3   Attention: Read list of digits (0/2) 2 2 2  1   Attention: Read list of letters (0/1) 1 1 1  1   Attention: Serial 7 subtraction starting at 100 (0/3) 3 3 3  3   Language: Repeat phrase (0/2) 2 2 2  1   Language : Fluency (0/1) 1 1 1   0  Abstraction (0/2) 2 2 2  1   Delayed Recall (0/5) 3 4 5  4   Orientation (0/6) 6 6 6  6   Total 27 28 25  24   Adjusted Score (based on education) 27  25  24    ..    08/13/2024    7:52 AM 04/30/2024    7:13 AM 05/15/2023    1:58 PM 01/31/2023    1:38 PM 08/29/2022    8:56 AM  Depression screen PHQ 2/9  Decreased Interest 0 0 0 0 0  Down, Depressed, Hopeless 1 0 0 0 0  PHQ - 2 Score 1 0 0 0 0  Altered sleeping 0   1   Tired, decreased energy 0      Change in appetite 0   1   Feeling bad or failure about yourself  1  0   Trouble concentrating 0   1   Moving slowly or fidgety/restless 0   0   Suicidal thoughts 0   0   PHQ-9 Score 2   3    Difficult doing work/chores Not difficult at all         Data saved with a previous flowsheet row definition   ..    08/13/2024    7:53 AM 01/31/2023    1:39 PM 08/05/2022    9:15 AM 06/30/2021   11:13 AM  GAD 7 : Generalized Anxiety Score  Nervous, Anxious, on Edge 1 1  2   0   Control/stop worrying 0 0  2  0   Worry too much - different things 1 0  0  0   Trouble relaxing 0 0  0  0   Restless 0 0  0  0   Easily annoyed or irritable 1 1  1  1    Afraid - awful might happen 0 0  0  0   Total GAD 7 Score 3 2 5 1   Anxiety Difficulty Not difficult at all Not difficult at all Not difficult at all Not difficult at all     Data saved with a previous flowsheet row definition      Physical Exam Constitutional:      Appearance: Normal appearance.  Cardiovascular:     Rate and Rhythm:  Normal rate.  Pulmonary:     Effort: Pulmonary effort is normal.  Neurological:     General: No focal deficit present.     Mental Status: She is alert and oriented to person, place, and time.  Psychiatric:        Mood and Affect: Mood normal.      The 10-year ASCVD risk score (Arnett DK, et al., 2019) is: 11.9%    Assessment & Plan:  .Naomie FORMOSA was seen today for medical management of chronic issues.  Diagnoses and all orders for this visit:  Mild dementia with agitation, unspecified dementia type (HCC) -     sertraline  (ZOLOFT ) 25 MG tablet; Take 1 tablet (25 mg total) by mouth daily.   Assessment & Plan Mild dementia with agitation Mild dementia with stable cognitive function. Current score MOCA score is 27/30, PHQ is 2 and GAD is 3. Agitation decreased with sertraline  25 mg. Mood stable, no sertraline  increase needed. - Continue Aricept  10 mg oral daily. - Continue sertraline  25 mg oral daily. - Encouraged engagement in cognitive activities such as crossword puzzles. - Encouraged participation in social activities and exercise, such as chair yoga. - Scheduled a six-month follow-up to reassess cognitive function and medication efficacy.  General Health Maintenance Discussed importance of physical activity and social engagement for health and cognitive support. - Encouraged participation in chair yoga and other physical activities. - Promoted social engagement through community activities.     Return in about 6 months (around 02/09/2025).    Avagail Whittlesey, PA-C  "

## 2024-08-13 ENCOUNTER — Encounter: Payer: Self-pay | Admitting: Physician Assistant

## 2025-02-11 ENCOUNTER — Ambulatory Visit: Admitting: Physician Assistant

## 2025-05-27 ENCOUNTER — Inpatient Hospital Stay: Admitting: Hematology and Oncology
